# Patient Record
Sex: Female | Born: 1950 | Race: Black or African American | Hispanic: No | Smoking: Never smoker
Health system: Southern US, Community
[De-identification: ages and names within clinical notes are randomized; demographics above are authoritative.]

## PROBLEM LIST (undated history)

## (undated) DIAGNOSIS — M1990 Unspecified osteoarthritis, unspecified site: Secondary | ICD-10-CM

## (undated) DIAGNOSIS — I10 Essential (primary) hypertension: Secondary | ICD-10-CM

## (undated) DIAGNOSIS — L02419 Cutaneous abscess of limb, unspecified: Secondary | ICD-10-CM

## (undated) DIAGNOSIS — L03119 Cellulitis of unspecified part of limb: Secondary | ICD-10-CM

## (undated) DIAGNOSIS — N390 Urinary tract infection, site not specified: Secondary | ICD-10-CM

## (undated) DIAGNOSIS — Z87442 Personal history of urinary calculi: Secondary | ICD-10-CM

## (undated) HISTORY — PX: BREAST CYST ASPIRATION: SHX578

## (undated) HISTORY — PX: BUNIONECTOMY: SHX129

## (undated) HISTORY — PX: KNEE ARTHROSCOPY: SHX127

## (undated) HISTORY — PX: ABDOMINAL HYSTERECTOMY: SHX81

---

## 2002-04-21 IMAGING — MG UNKNOWN MG STUDY
1 series · 4 of 4 positions shown · non-contrast
Comparison: none

REASON FOR EXAM: scr..hm[PHONE_NUMBER]

Procedure: BILATERAL DIGITAL SCREENING MAMMOGRAPHY WITH CAD
 No dominant masses or pathologic clustered calcifications are demonstrated.
The exam is stable from prior exams with nodularity bilaterally. No new
lesions are identified.

[Series 8187: R CC · right · 4 of 4 slices shown]
[im 1/4]
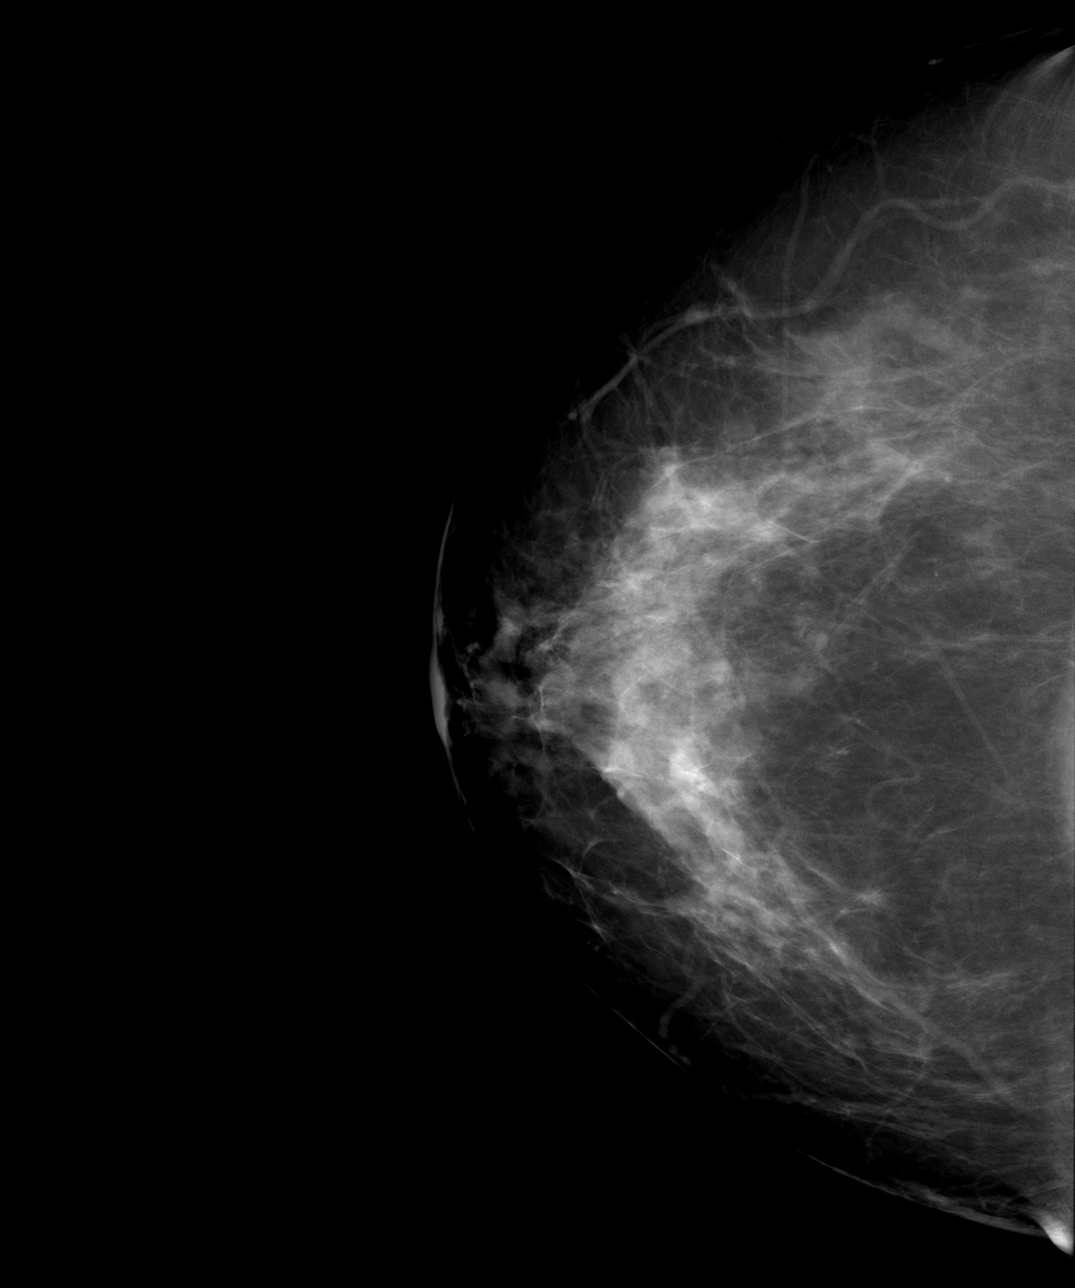
[im 2/4]
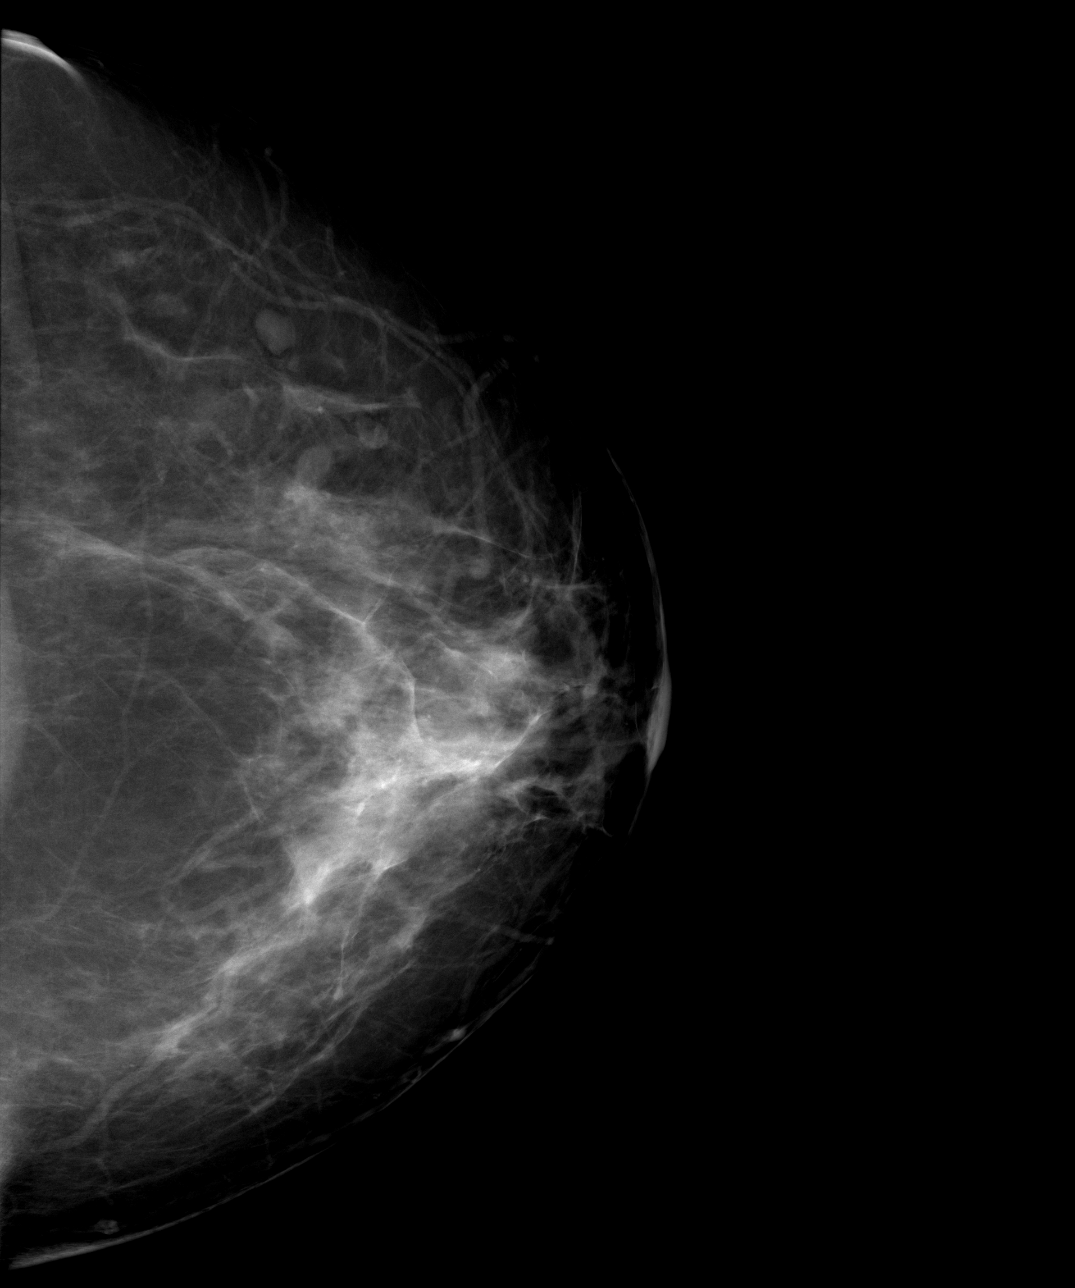
[im 3/4]
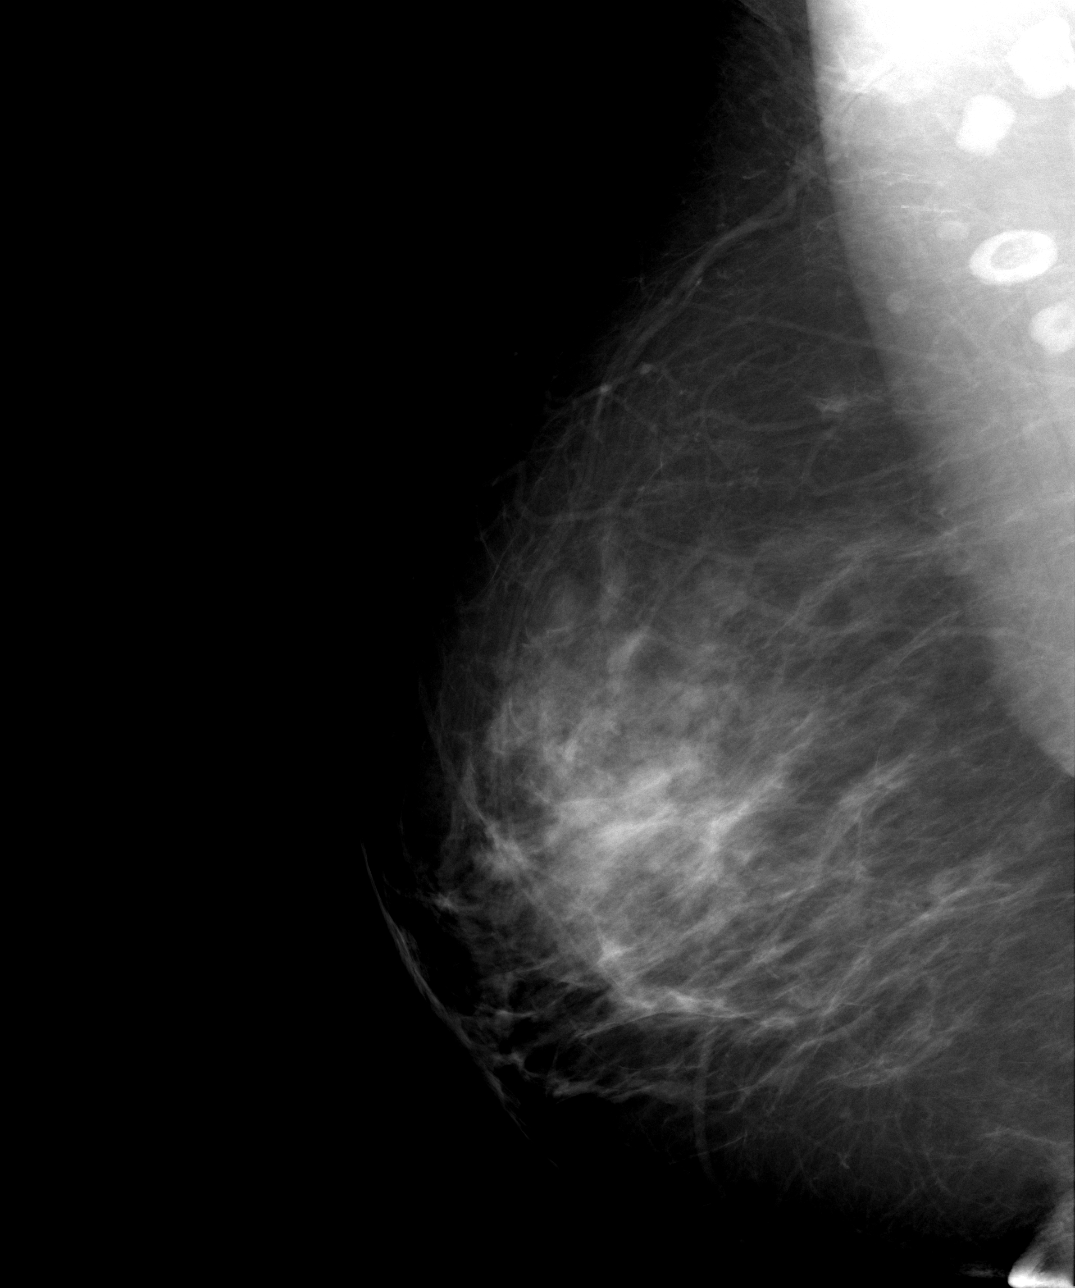
[im 4/4]
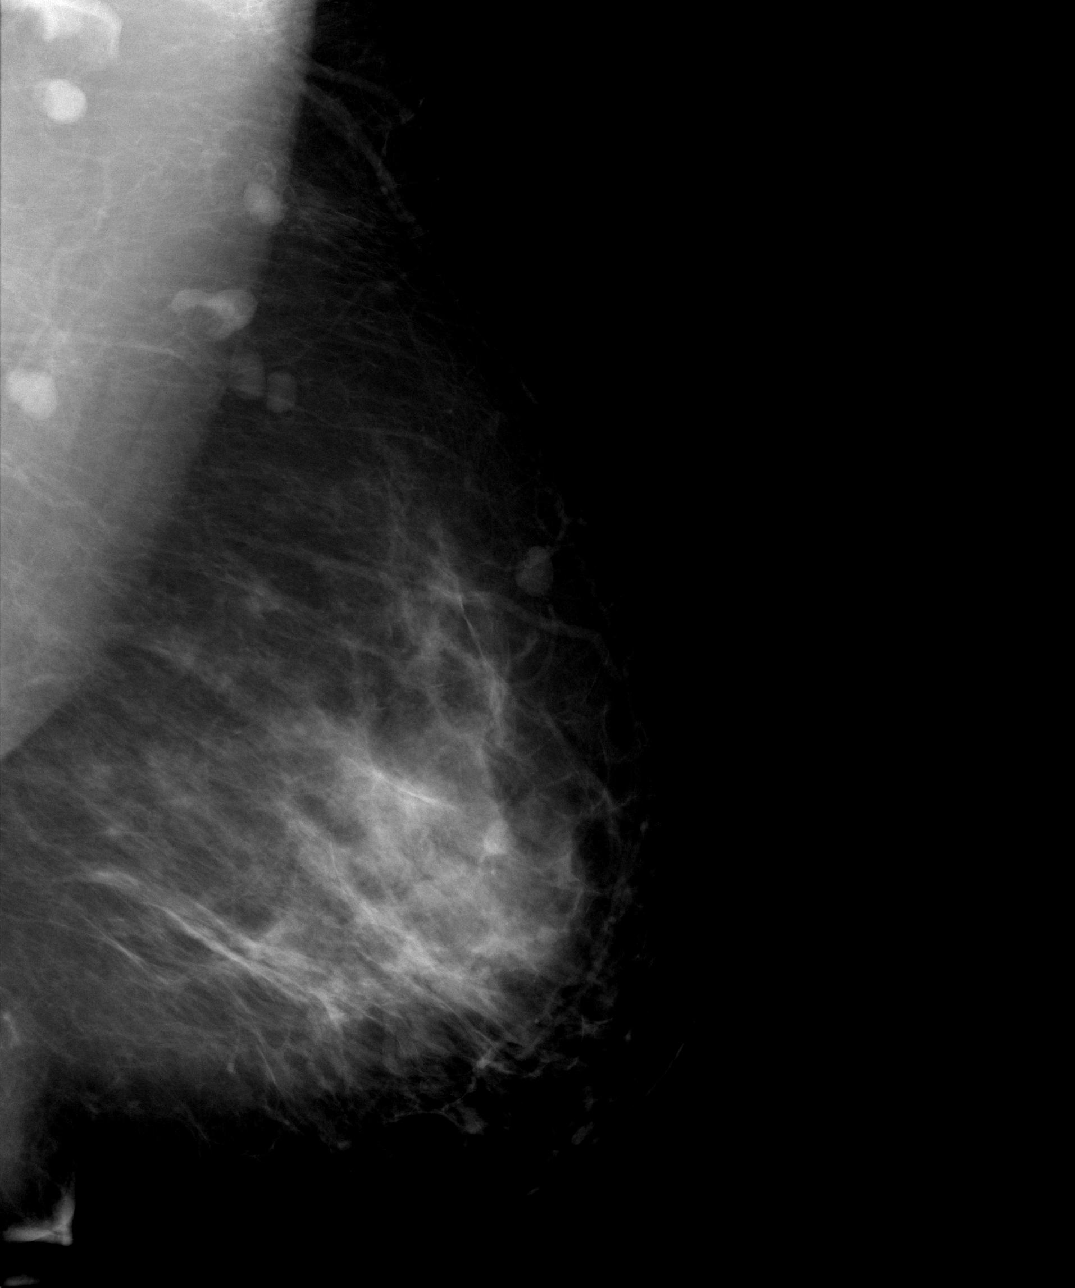

[4 of 4 positions shown; findings below may reference images not displayed]

IMPRESSION: Stable, benign exam. Yearly follow-up mammograms are suggested.

 BI-RADS: Category 2 - Benign Finding

 A NEGATIVE MAMMOGRAM REPORT DOES NOT PRECLUDE BIOPSY OR OTHER EVALUATION OF
CLINICALLY PALPABLE OR OTHERWISE SUSPICIOUS MASS OR LESION. BREAST CANCER
MAY NOT BE DETECTED BY MAMMOGRAPHY IN UP TO 10% OF CASES.

## 2002-05-07 ENCOUNTER — Ambulatory Visit (HOSPITAL_BASED_OUTPATIENT_CLINIC_OR_DEPARTMENT_OTHER): Admission: RE | Admit: 2002-05-07 | Discharge: 2002-05-07 | Payer: Self-pay | Admitting: *Deleted

## 2004-10-24 ENCOUNTER — Ambulatory Visit: Payer: Self-pay | Admitting: Internal Medicine

## 2004-10-27 ENCOUNTER — Ambulatory Visit: Payer: Self-pay | Admitting: Internal Medicine

## 2004-11-07 ENCOUNTER — Emergency Department: Payer: Self-pay | Admitting: Emergency Medicine

## 2005-12-25 IMAGING — CR DG SHOULDER 3+V*L*
1 series · 3 of 3 positions shown · non-contrast
Comparison: none

REASON FOR EXAM: Left shoulder pain
COMMENTS:

PROCEDURE:     DXR - DXR SHOULDER LEFT COMPLETE  - [DATE] [DATE]
RESULT:     Three views of the LEFT shoulder show no fracture, dislocation
or other acute bony abnormality.

[Series 1: view not recorded · 0.17mm/px · 3 of 3 slices shown]
[im 1/3]
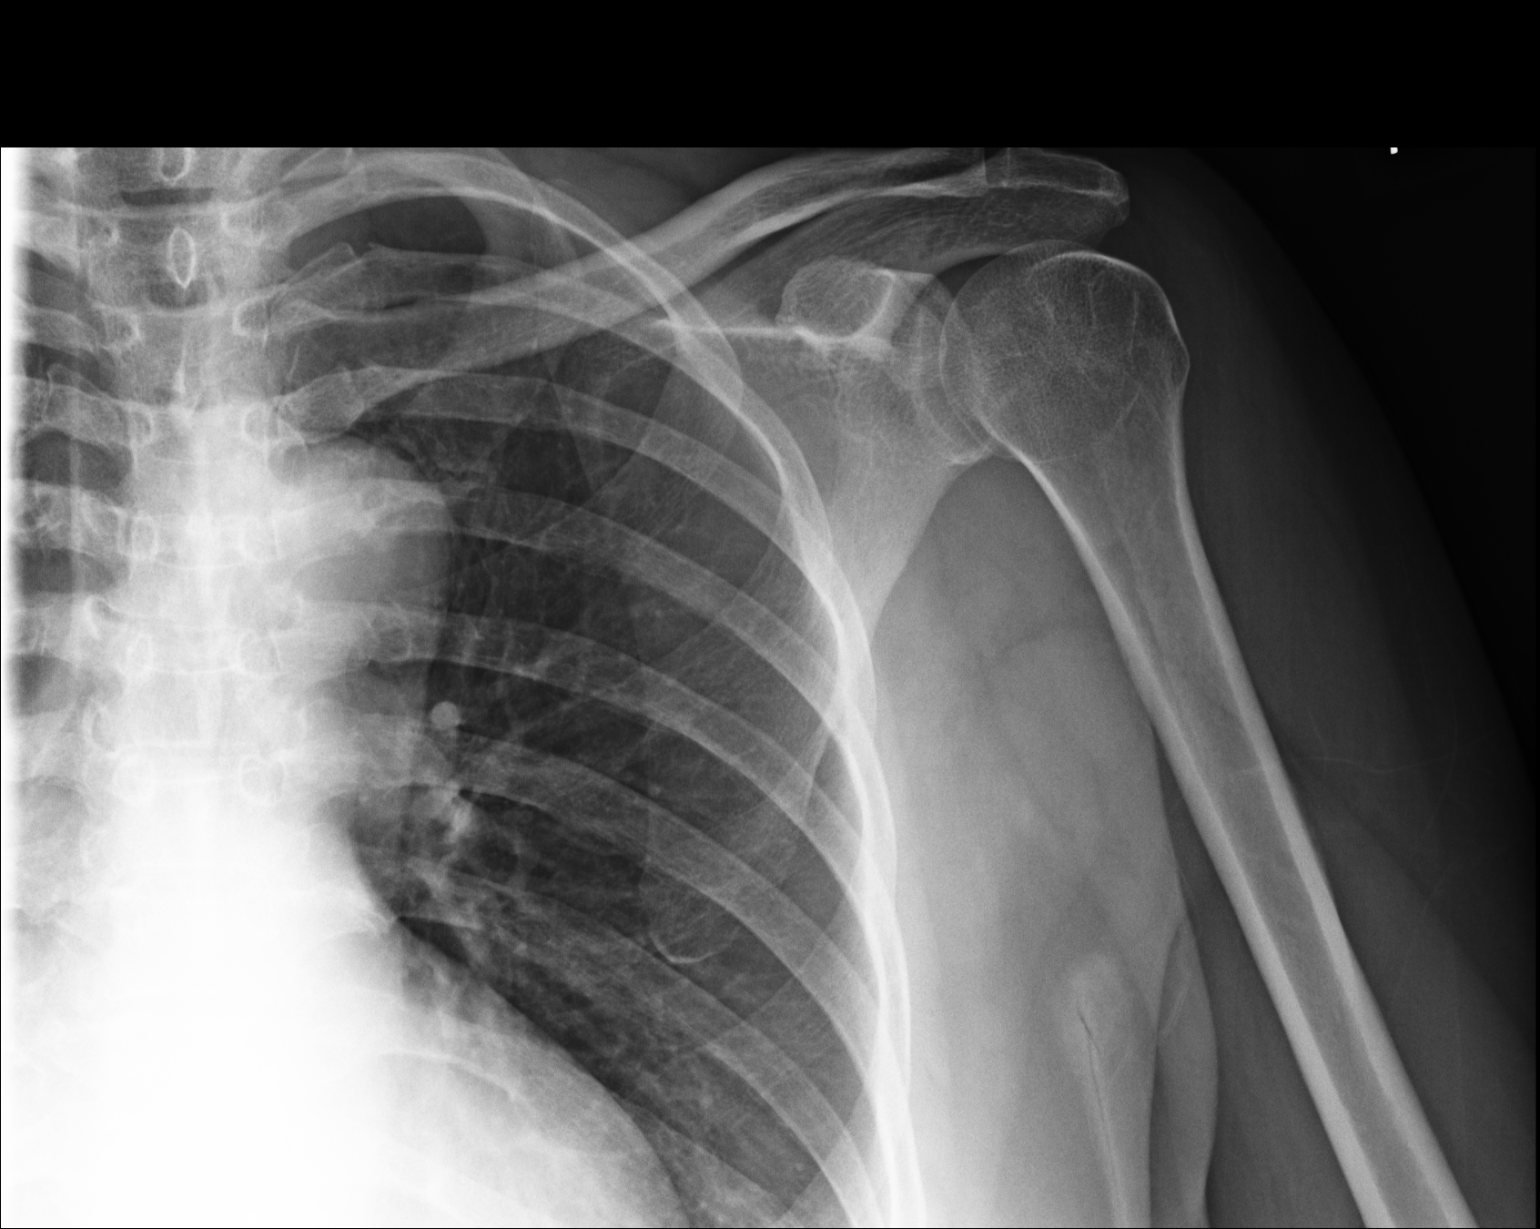
[im 2/3]
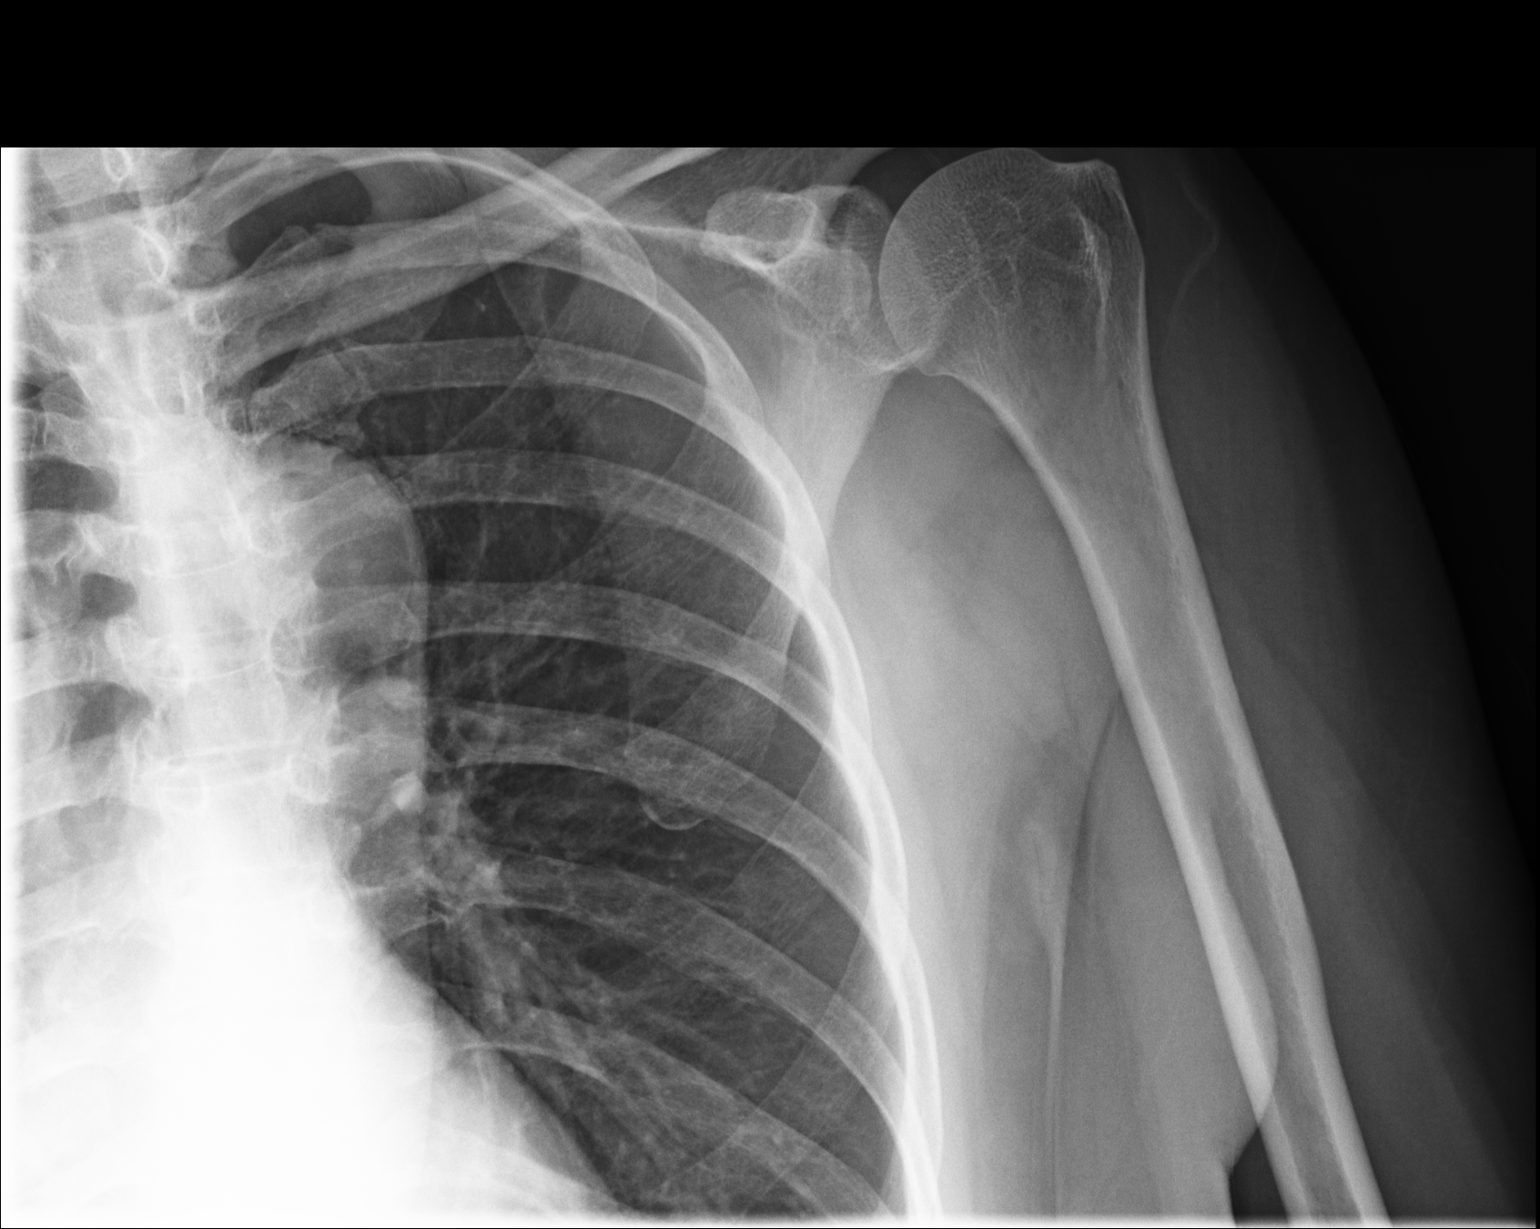
[im 3/3]
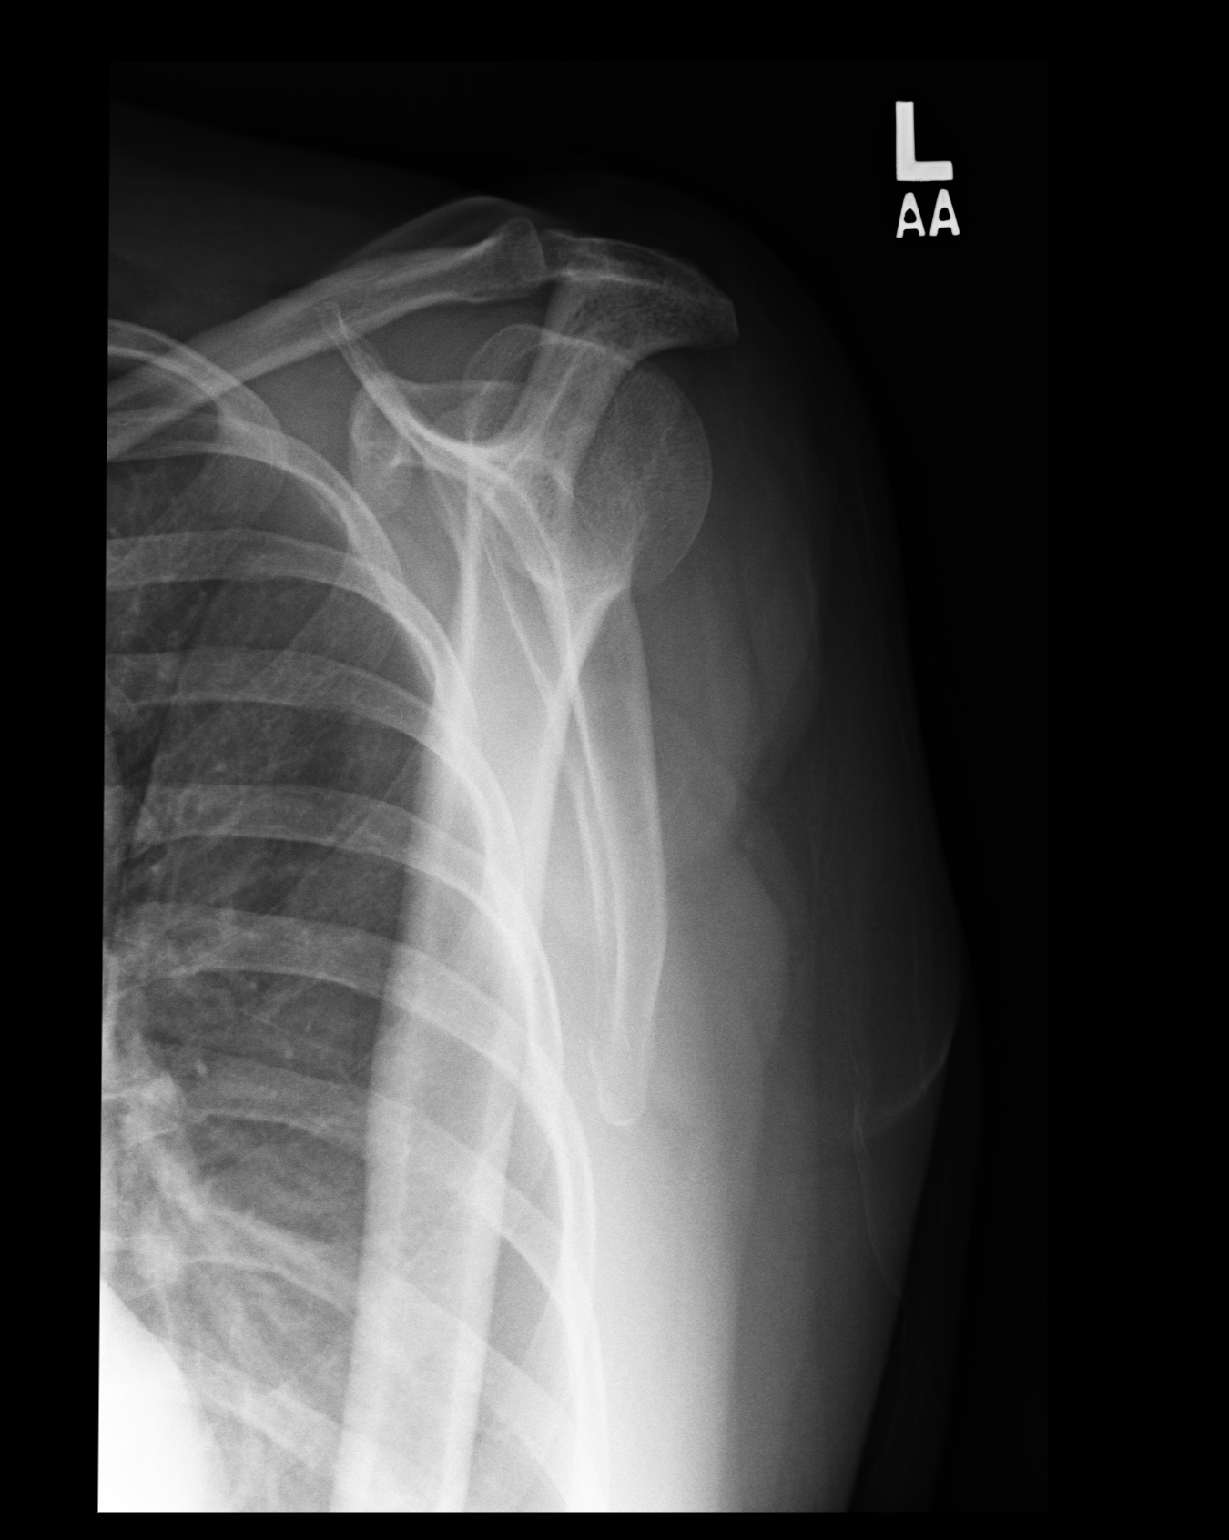

[3 of 3 positions shown; findings below may reference images not displayed]

IMPRESSION: No significant abnormalities are noted.

## 2006-02-26 ENCOUNTER — Emergency Department: Payer: Self-pay | Admitting: Unknown Physician Specialty

## 2006-02-26 ENCOUNTER — Other Ambulatory Visit: Payer: Self-pay

## 2006-02-26 IMAGING — CR DG CHEST 1V PORT
1 series · 1 of 1 positions shown · non-contrast
Comparison: none

REASON FOR EXAM: Chest pain      rm 19
COMMENTS:  LMP: Post Hysterectomy

[view not recorded]
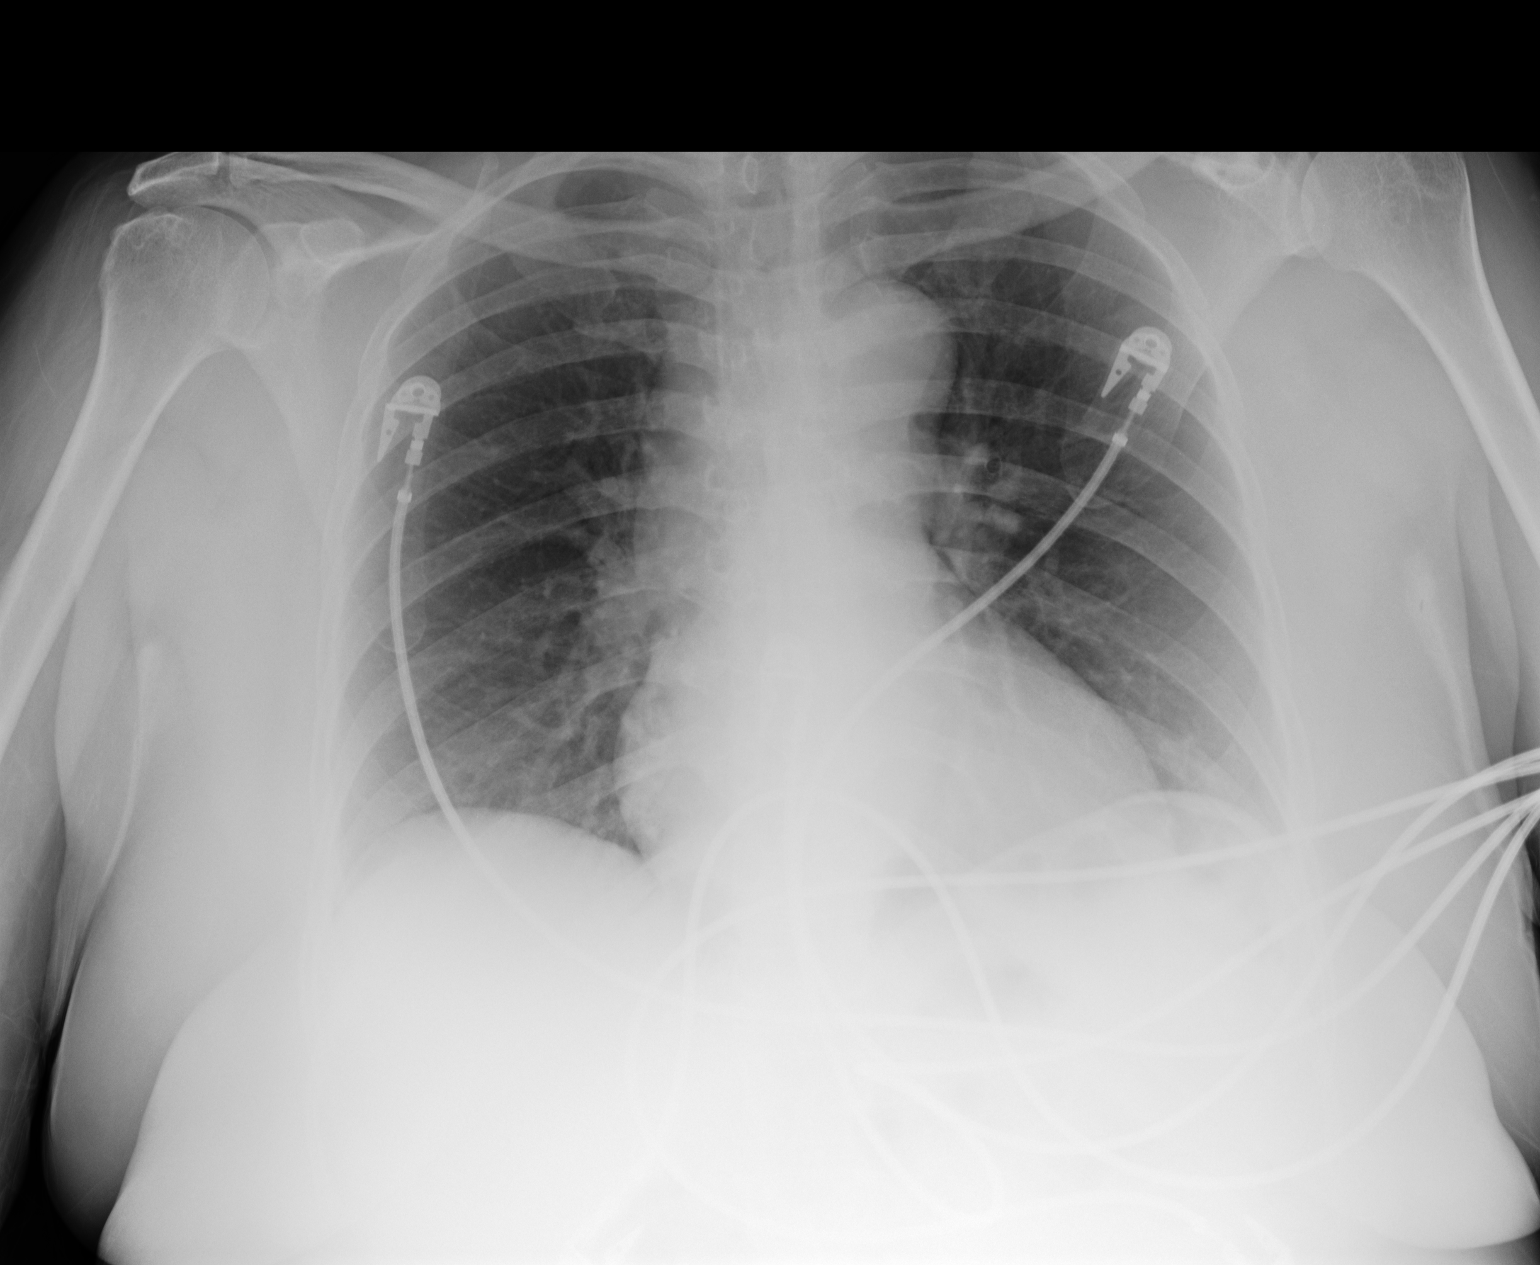

[1 of 1 positions shown; findings below may reference images not displayed]

PROCEDURE:     DXR - DXR PORTABLE CHEST SINGLE VIEW  - [DATE]  [DATE]

RESULT:       The lungs are adequately inflated. There is no focal
infiltrate. The heart is not enlarged.  There is mild tortuosity of the
descending thoracic aorta.  There is prominence of the central pulmonary
vascularity to a mild degree.  No cephalization is seen.
IMPRESSION: I do not see evidence of pneumonia nor definite evidence of other acute
cardiopulmonary disease.  A follow-up PA and lateral chest x-ray would be of
value.

## 2006-02-26 IMAGING — CT CT CHEST W/ CM
2 series · 15 of 31 positions shown, 19 images · IV contrast (APPLIED)
Comparison: none

REASON FOR EXAM: Chest pain, elevated D-Dimer
COMMENTS:  LMP: Post Hysterectomy

[Series 4: soft tissue · axial · 0.74mm/px · z∈[-80,-40]mm · 2 of 85 slices shown]
[im 7/85  mediastinal]
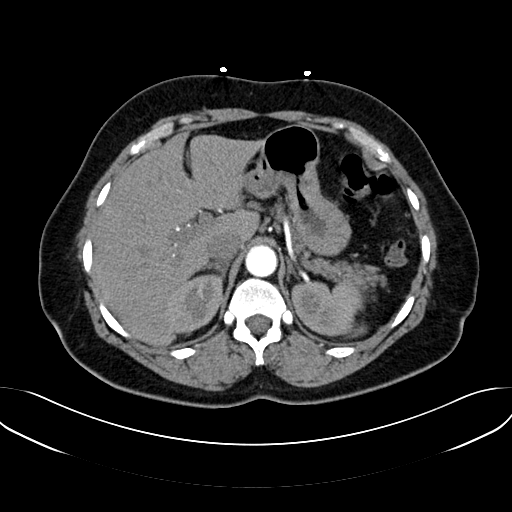
[im 20/85  mediastinal]
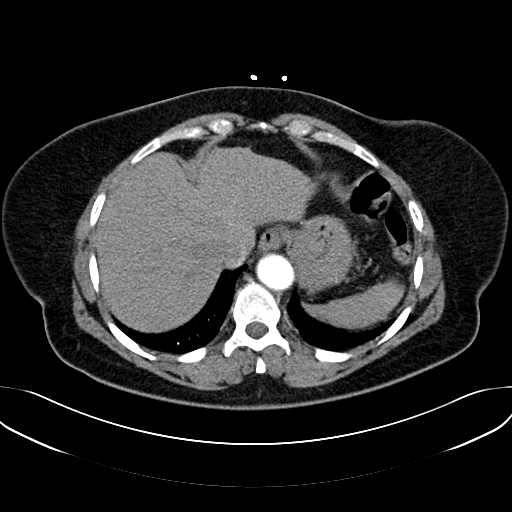

[Series 5: lung windows · axial · 0.74mm/px · z∈[-74,+134]mm · 13 of 83 slices shown, 17 images]
[im 7/83  mediastinal]
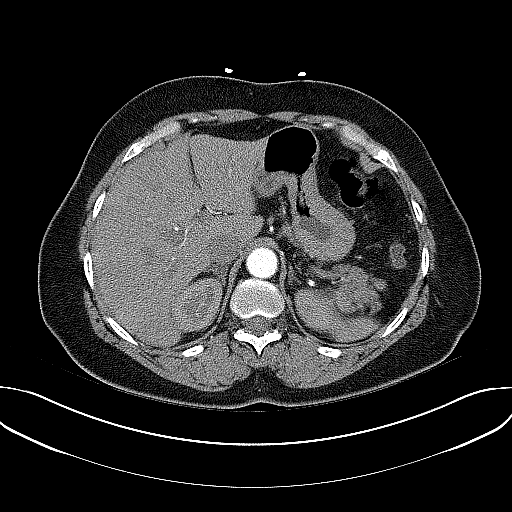
[im 7/83  lung]
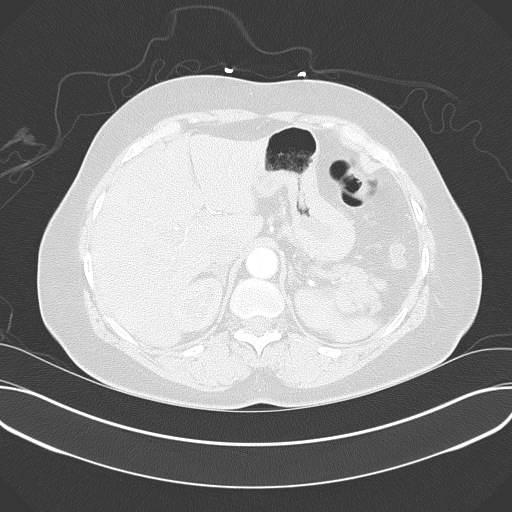
[im 13/83  lung]
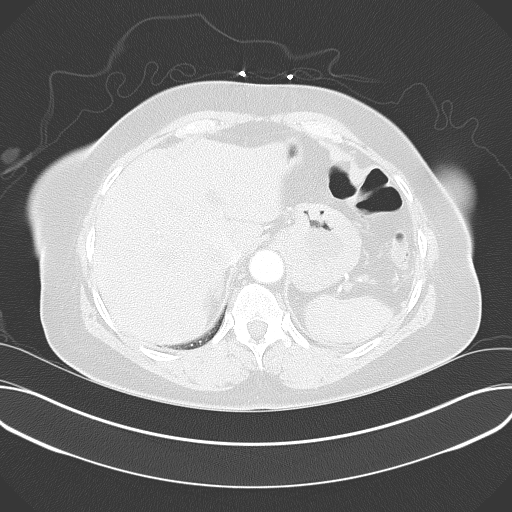
[im 19/83  lung]
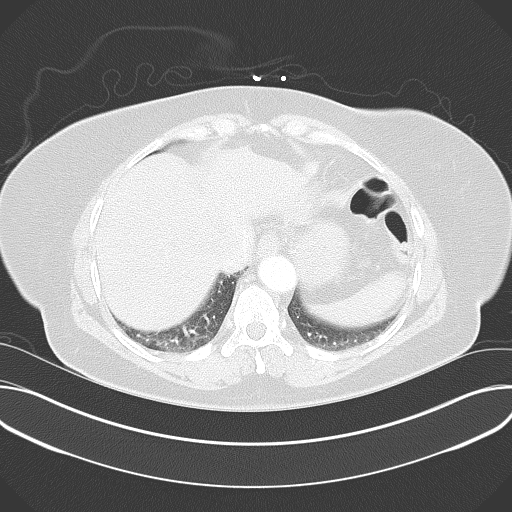
[im 26/83  lung]
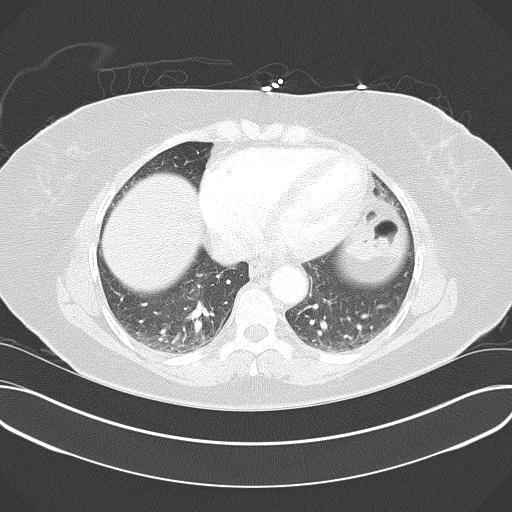
[im 32/83  mediastinal]
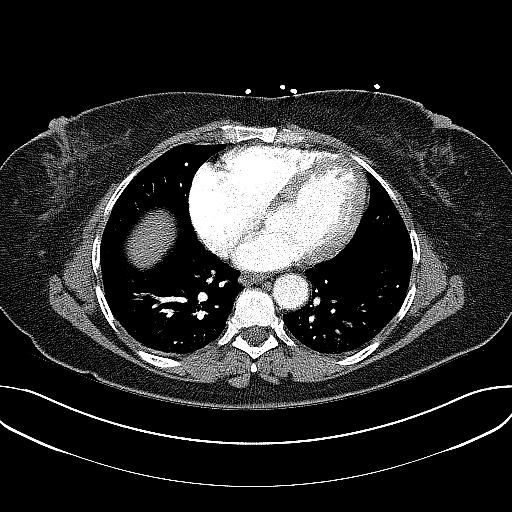
[im 32/83  lung]
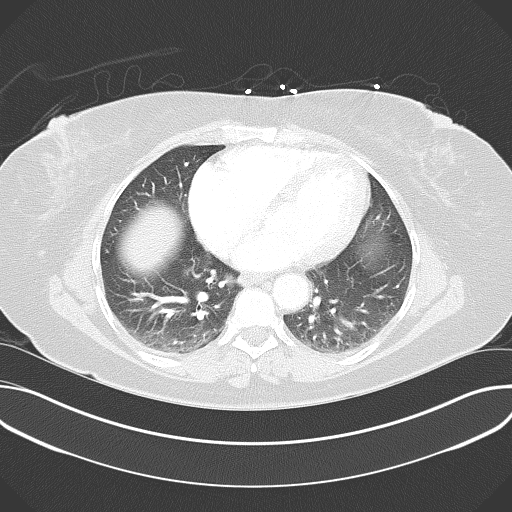
[im 38/83  lung]
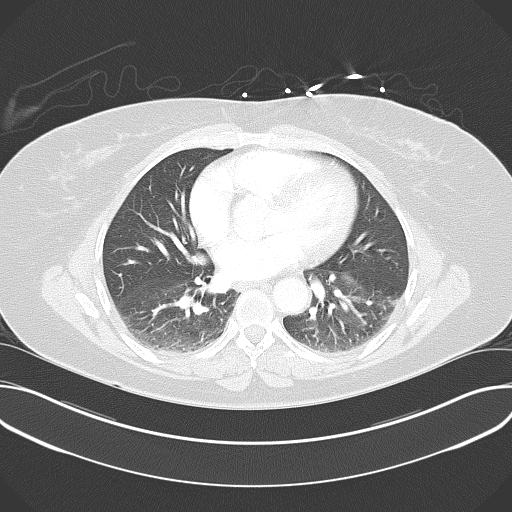
[im 42/83  lung]
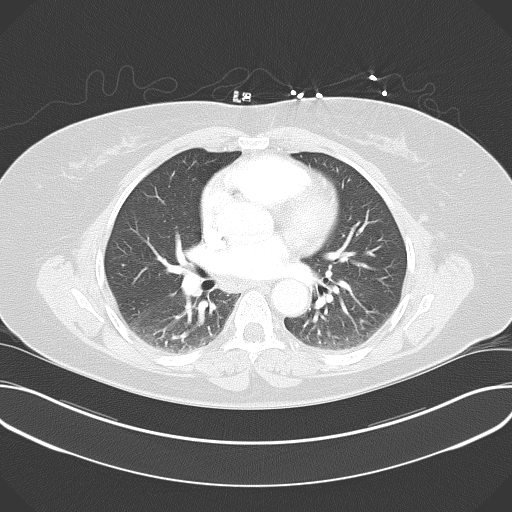
[im 45/83  lung]
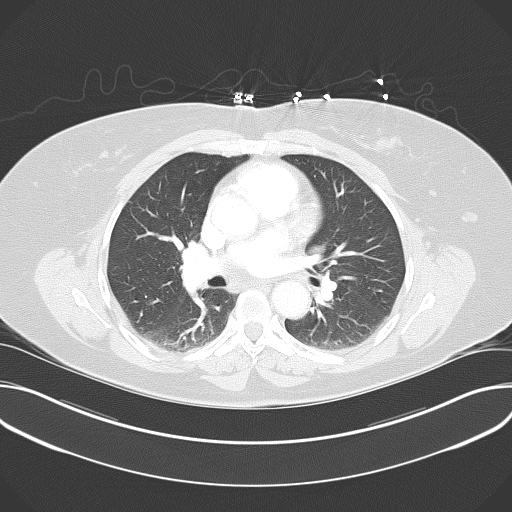
[im 51/83  mediastinal]
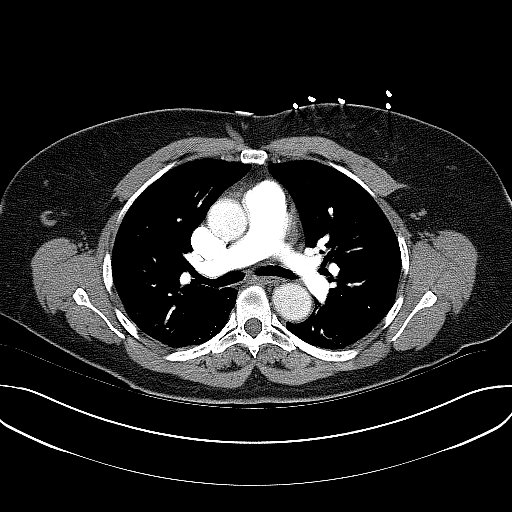
[im 51/83  lung]
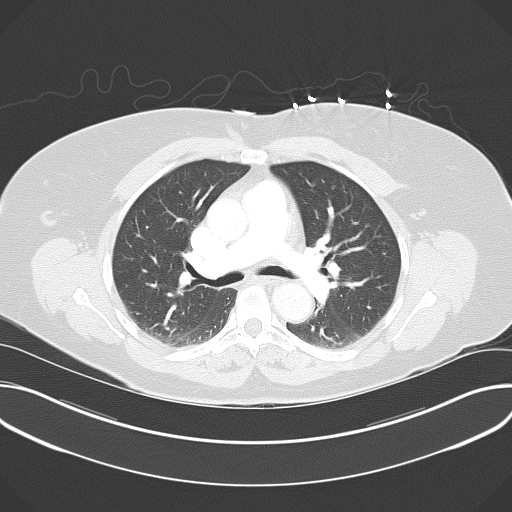
[im 57/83  lung]
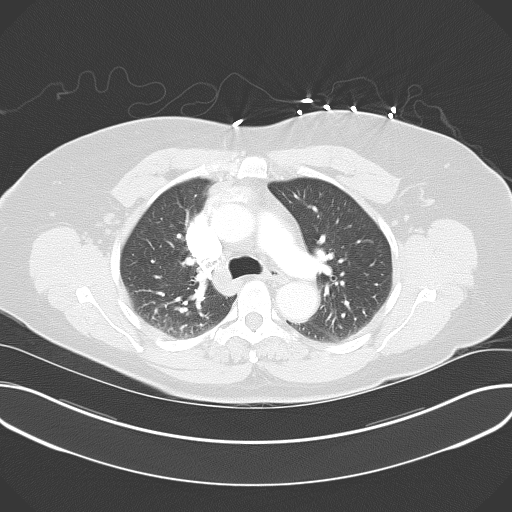
[im 64/83  lung]
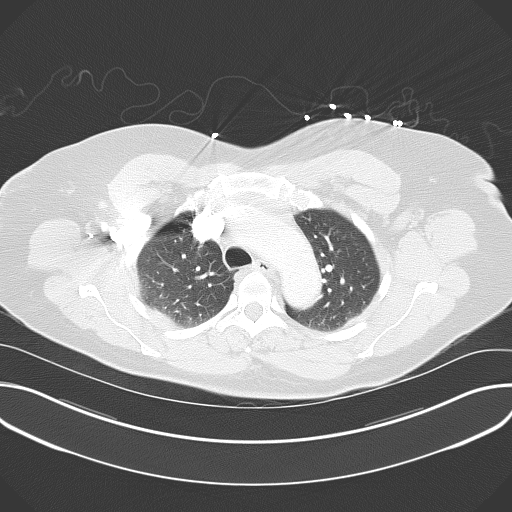
[im 70/83  lung]
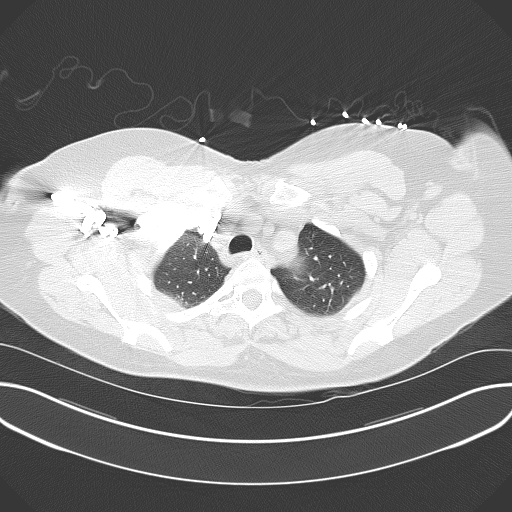
[im 76/83  mediastinal]
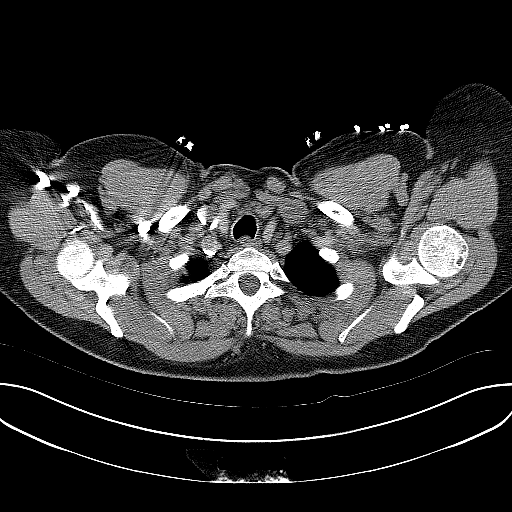
[im 76/83  lung]
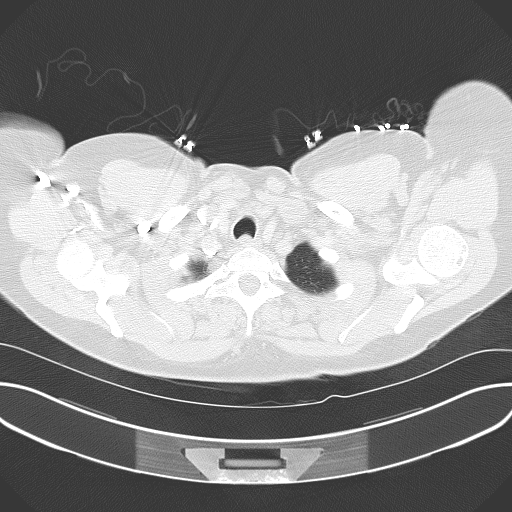

[15 of 31 positions shown; findings below may reference images not displayed]

PROCEDURE:     CT  - CT CHEST (FOR PE) W  - [DATE]  [DATE]

RESULT:     This is a re-dictation of a previously dictated report.

Spiral 3.0 mm sections were obtained from the lung bases through the pubic
symphysis.

Evaluation of the mediastinum and hilar regions and structures demonstrates
no evidence of mediastinal or hilar masses or adenopathy.

There is no evidence of filling defects in the main lobar or segmental
pulmonary arteries to suggest the sequelae of a pulmonary embolus. The
visualized lung parenchyma demonstrates mild bibasilar hypoventilation but
otherwise unremarkable. The visualized upper abdominal viscera demonstrate
no radiographic abnormalities.
IMPRESSION: 1.     No radiographic evidence of pulmonary embolic disease as described
above.
2.     Dr. WICKS of the Emergency Department was informed of these findings
at the time of the initial interpretation on [DATE].
3.     Minimal hypoventilation within the lung bases. Otherwise, no further
pulmonary abnormalities.

## 2006-07-12 ENCOUNTER — Ambulatory Visit: Payer: Self-pay | Admitting: Internal Medicine

## 2006-07-12 IMAGING — CR DG KNEE COMPLETE 4+V*L*
1 series · 4 of 4 positions shown · non-contrast
Comparison: none

REASON FOR EXAM: LEFT KNEE HX FALL
COMMENTS:

[Series 1: view not recorded · 0.17mm/px · 4 of 4 slices shown]
[im 1/4]
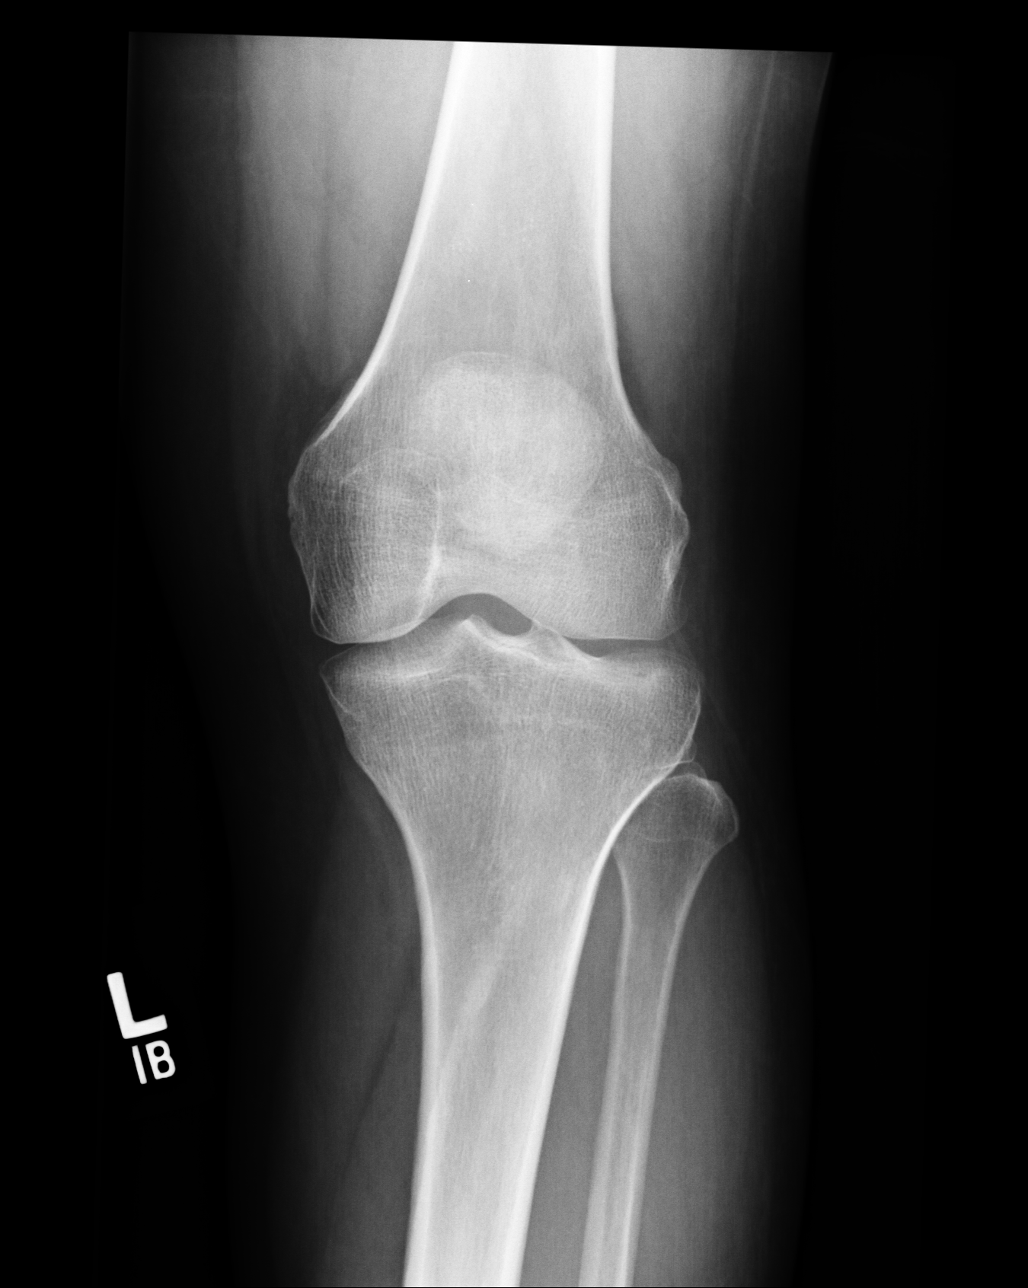
[im 2/4]
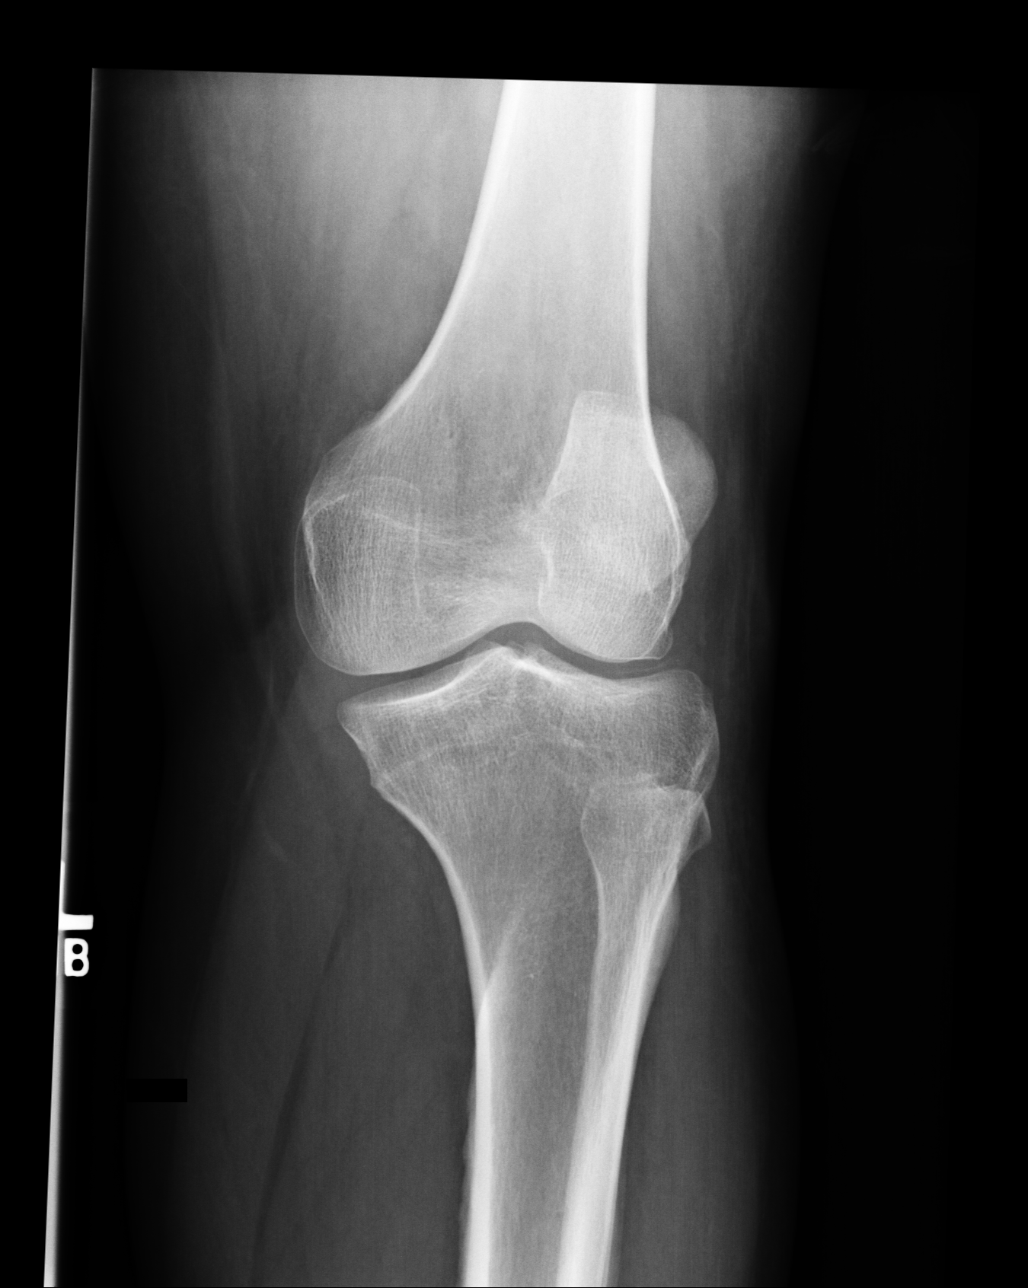
[im 3/4]
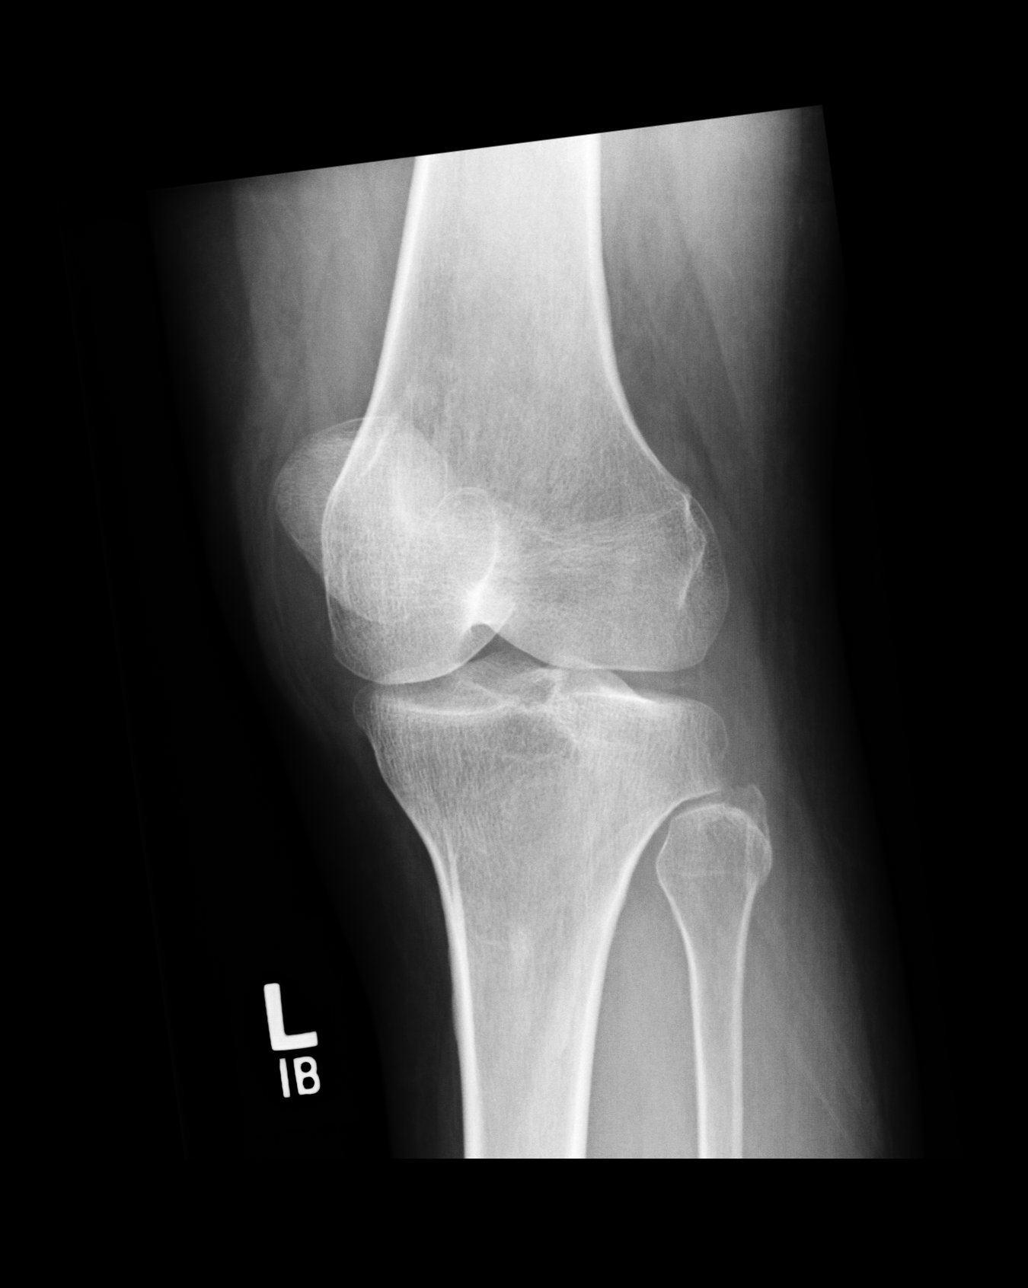
[im 4/4]
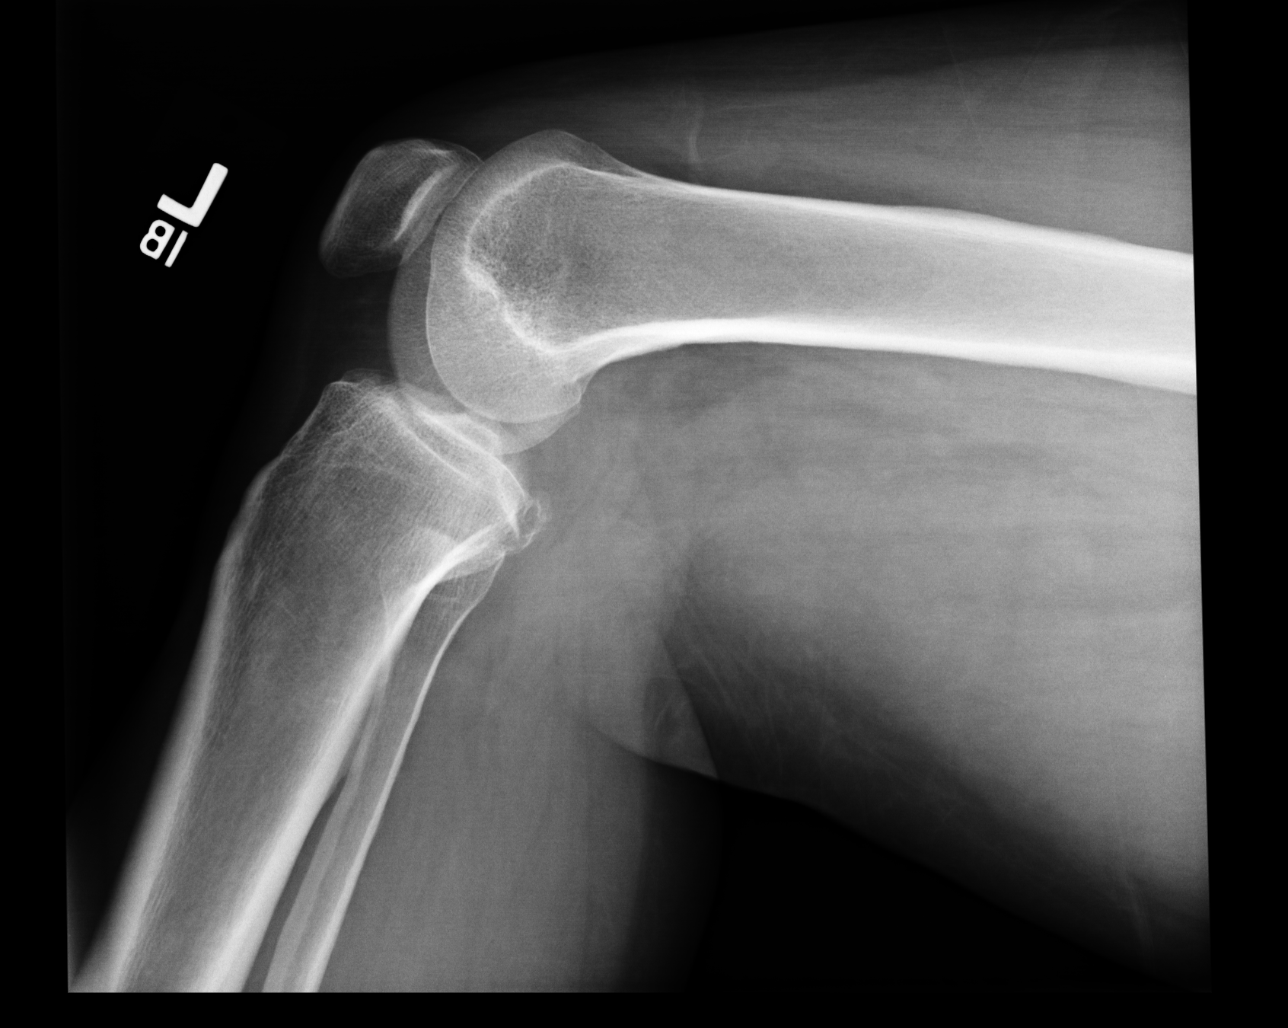

[4 of 4 positions shown; findings below may reference images not displayed]

PROCEDURE:     DXR - DXR KNEE LT COMP WITH OBLIQUES  - [DATE] [DATE]

RESULT:        Four views of the LEFT knee show no fracture, dislocation or
other acute bony abnormality.   There is noted slight calcification in the
knee joint space laterally.  The calcification is faint but is of the type
that can be seen with pseudogout, gout or osteoarthritis.  No definite
arthritic spurring about the knee is seen.  The patella is intact.
IMPRESSION: No fracture or other acute bony abnormality is identified.

There is faint calcification in the knee joint space laterally.

## 2006-07-12 IMAGING — CR DG KNEE COMPLETE 4+V*R*
1 series · 4 of 4 positions shown · non-contrast
Comparison: none

REASON FOR EXAM: XRAY RT KNEE PAIN HX FALL
COMMENTS:

[Series 1: view not recorded · 0.17mm/px · 4 of 4 slices shown]
[im 1/4]
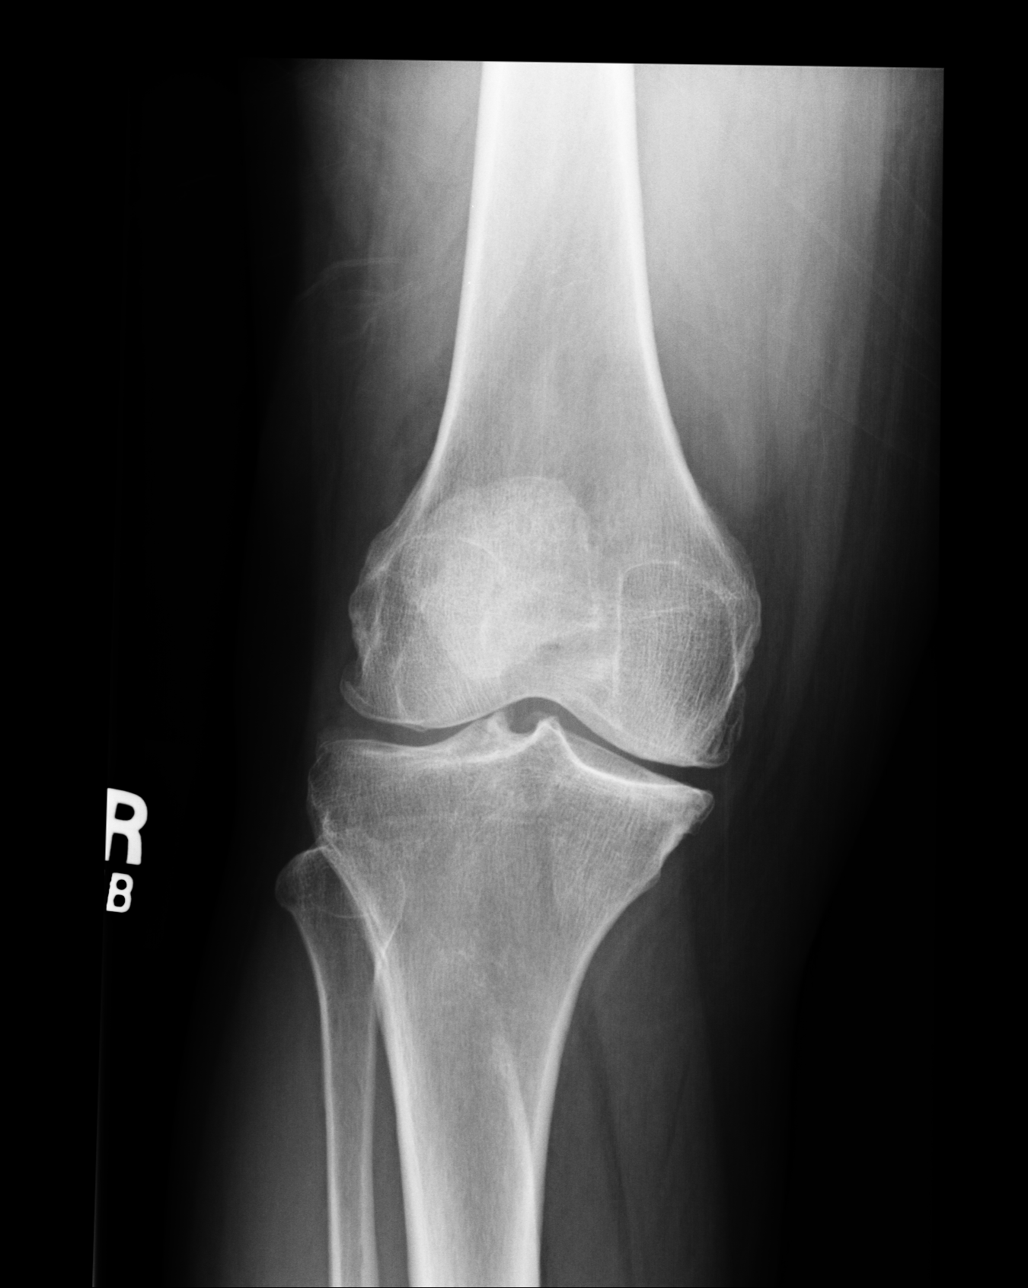
[im 2/4]
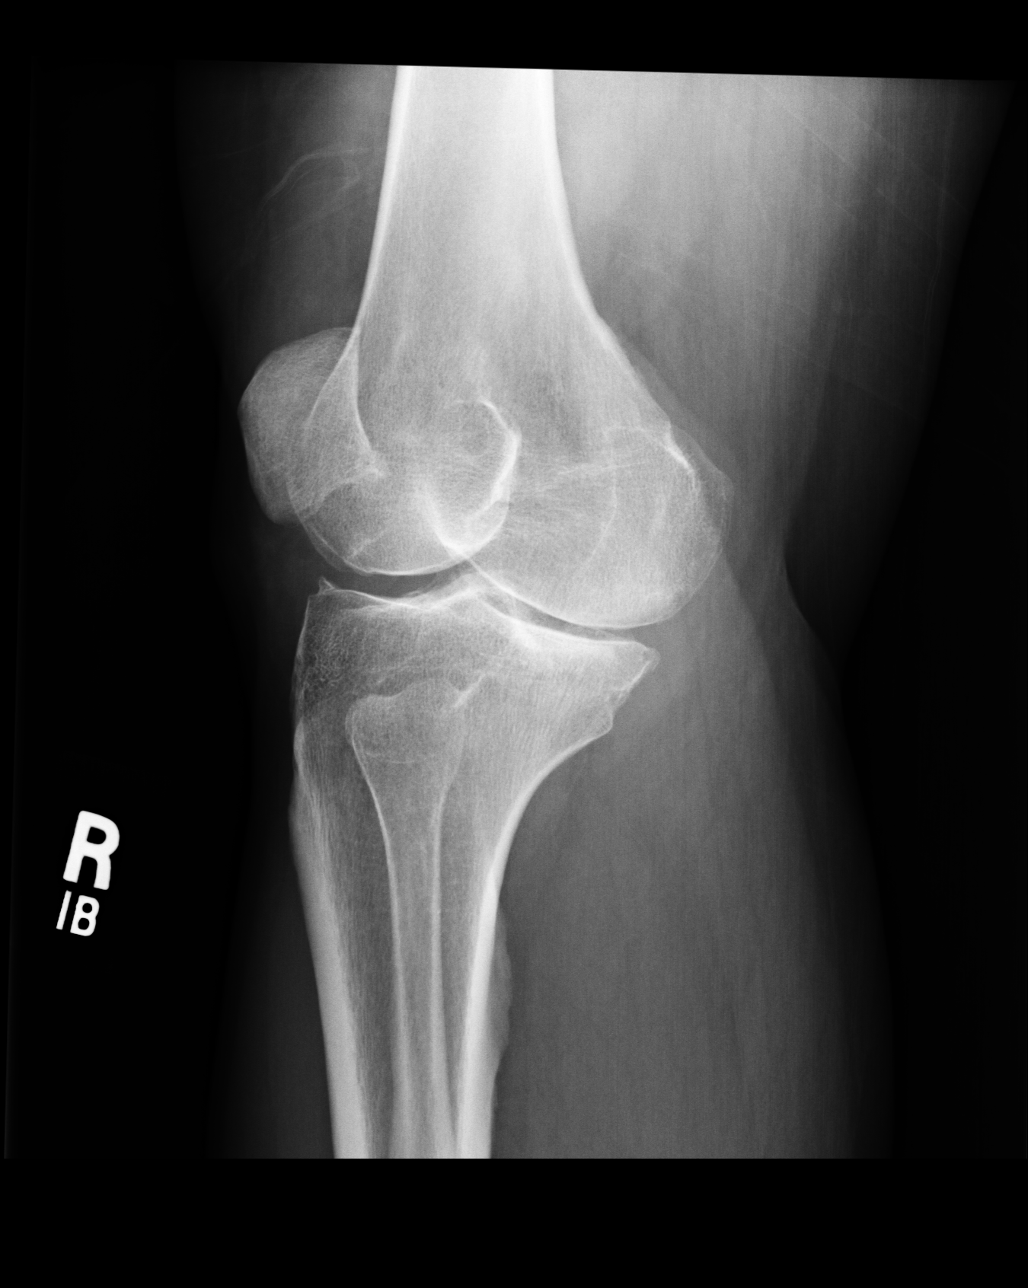
[im 3/4]
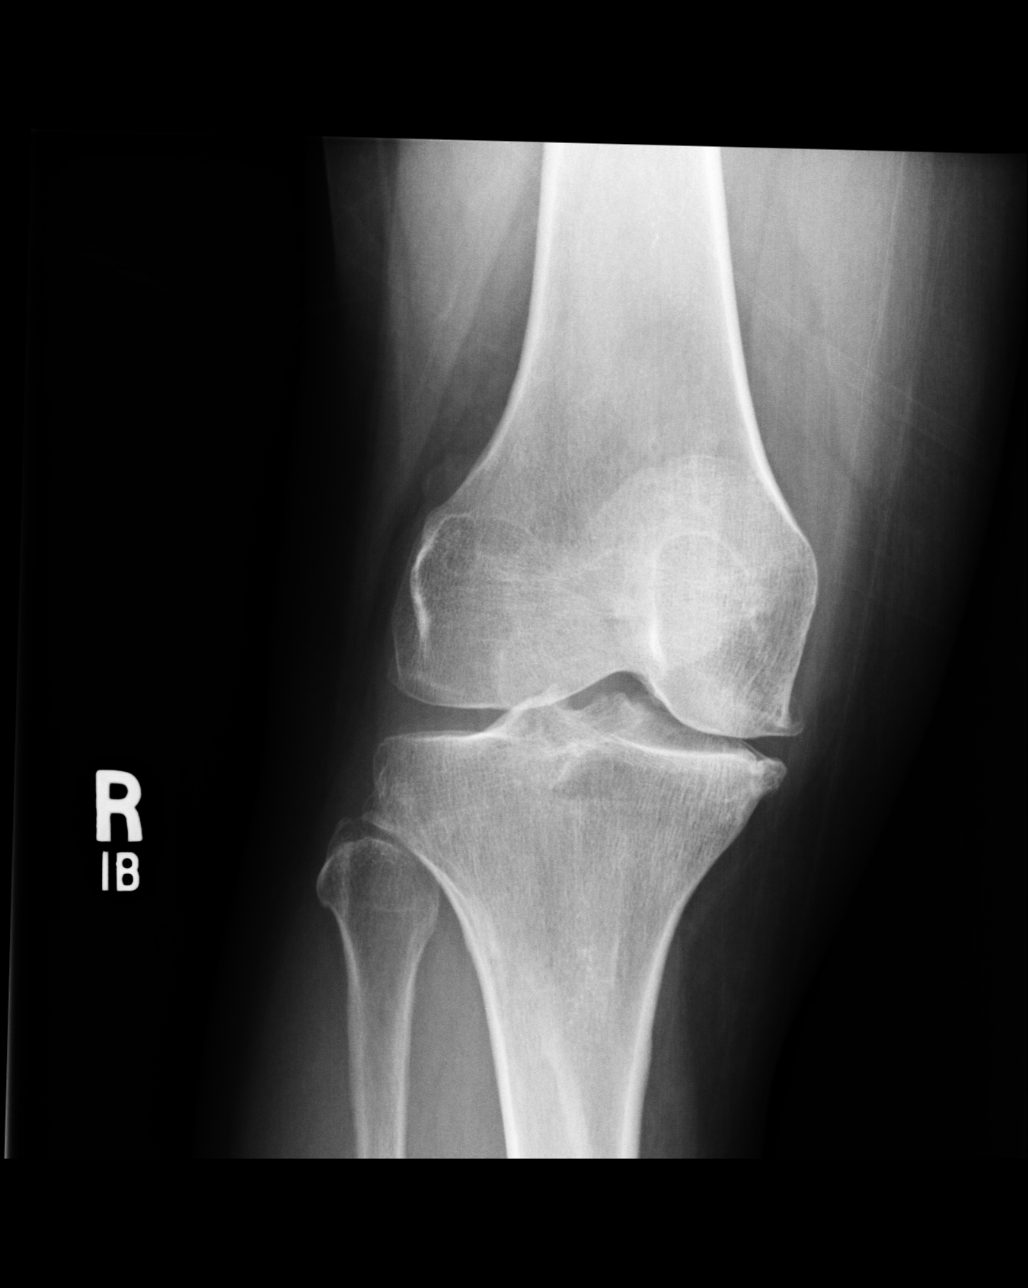
[im 4/4]
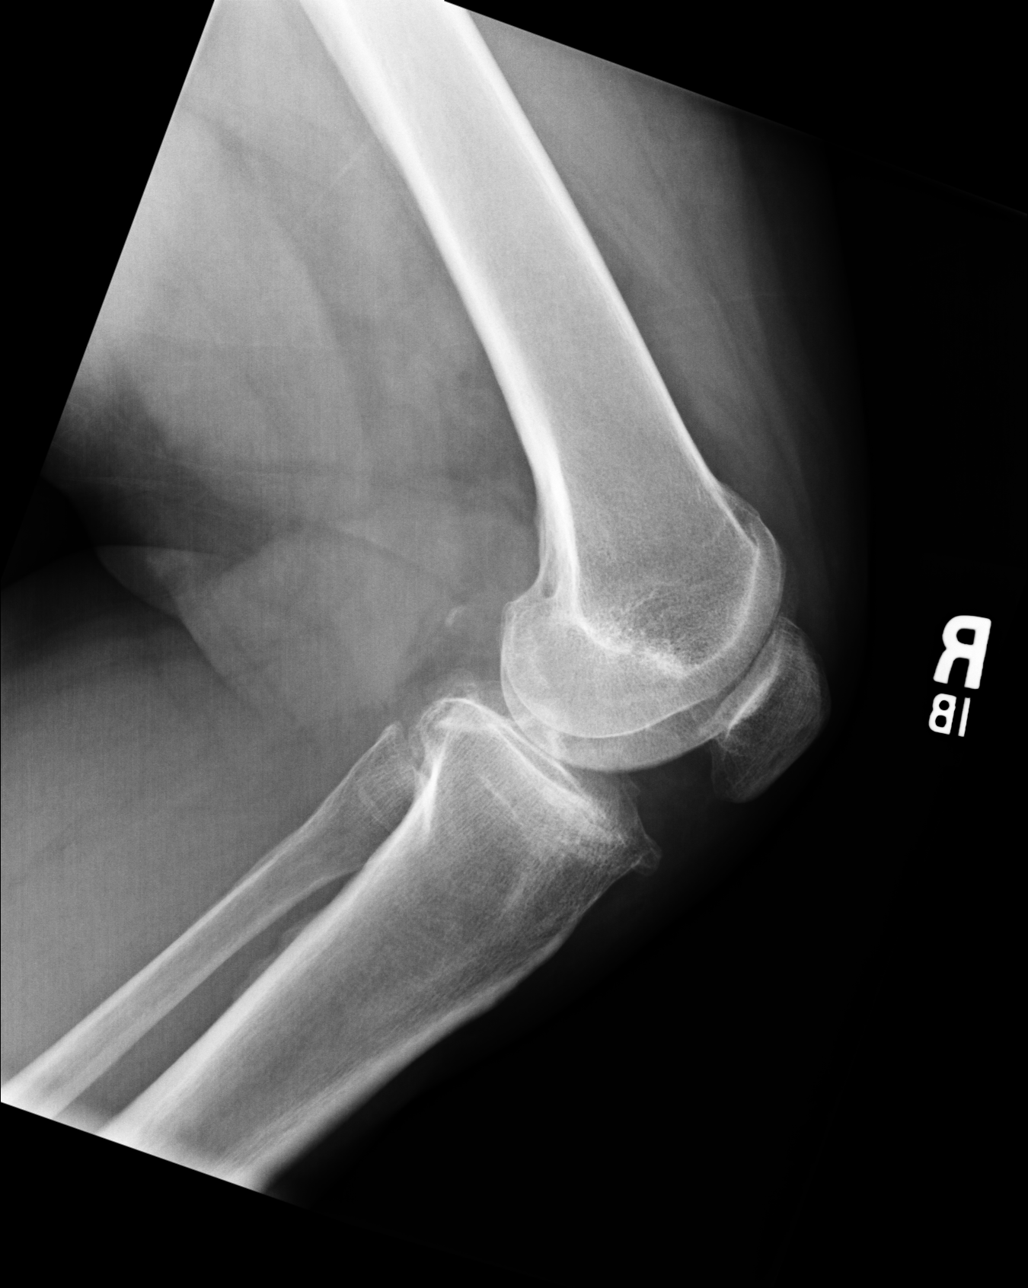

[4 of 4 positions shown; findings below may reference images not displayed]

PROCEDURE:     DXR - DXR KNEE RT COMP WITH OBLIQUES  - [DATE] [DATE]

RESULT:     Four views of the RIGHT knee were obtained.  No fracture or
dislocation is observed.  There is noted mild degenerative spurring about
the knee, more prominent medially where there is also noted slight narrowing
in the knee joint space consistent with arthritic change.  No erosive or
cystic changes are identified.  There is no sclerosis of the adjacent
articular surfaces.  The patella is intact.
IMPRESSION: No fracture is seen.

Arthritic changes are noted at the knee as described above.

## 2006-08-27 ENCOUNTER — Ambulatory Visit: Payer: Self-pay | Admitting: Internal Medicine

## 2007-09-28 ENCOUNTER — Emergency Department: Payer: Self-pay | Admitting: Emergency Medicine

## 2008-04-21 ENCOUNTER — Emergency Department: Payer: Self-pay | Admitting: Emergency Medicine

## 2008-05-04 ENCOUNTER — Emergency Department: Payer: Self-pay | Admitting: Emergency Medicine

## 2009-03-15 ENCOUNTER — Ambulatory Visit (HOSPITAL_BASED_OUTPATIENT_CLINIC_OR_DEPARTMENT_OTHER): Admission: RE | Admit: 2009-03-15 | Discharge: 2009-03-15 | Payer: Self-pay | Admitting: Orthopedic Surgery

## 2009-11-17 ENCOUNTER — Ambulatory Visit: Payer: Self-pay | Admitting: Family Medicine

## 2010-04-01 ENCOUNTER — Ambulatory Visit: Payer: Self-pay | Admitting: Family Medicine

## 2010-05-05 ENCOUNTER — Emergency Department: Payer: Self-pay | Admitting: Emergency Medicine

## 2011-01-01 LAB — POCT I-STAT 4, (NA,K, GLUC, HGB,HCT): Potassium: 3.8 mEq/L (ref 3.5–5.1)

## 2011-01-31 ENCOUNTER — Ambulatory Visit: Payer: Self-pay | Admitting: Family Medicine

## 2011-02-06 NOTE — Op Note (Signed)
NAME:  Jennifer Rubio, Jennifer Rubio                ACCOUNT NO.:  [id number]   MEDICAL RECORD NO.:  [id number]          PATIENT TYPE:  AMB   LOCATION:  [facility]                         FACILITY:  [facility]   PHYSICIAN:  Marlowe Kays, M.D.  DATE OF BIRTH:  October 19, 1950   DATE OF PROCEDURE:  03/15/2009  DATE OF DISCHARGE:                               OPERATIVE REPORT   PREOPERATIVE DIAGNOSES:  Torn medial and lateral menisci, left knee.   POSTOPERATIVE DIAGNOSES:  Torn medial and lateral menisci, left knee.   OPERATION:  Left knee arthroscopy with partial medial and lateral  meniscectomies and shaving of medial femoral condyle.   SURGEON:  Marlowe Kays, M.D.   ASSISTANT:  Nurse.   ANESTHESIA:  General.   PATHOLOGY AND JUSTIFICATION FOR PROCEDURE:  She has had a prior right  knee arthroscopy and because of a painful left knee, had an MRI  performed January 04, 2009 which showed extensive tearing of both medial  and lateral menisci leading to today's surgery.  See operative  description below.   PROCEDURE:  Satisfactory general anesthesia, Ace wrap and knee support  to right lower extremity, pneumatic tourniquet applied to left lower  extremity with the leg Esmarched out non sterilely.  Thigh stabilizer  applied and leg prepped from stabilizer to ankle with DuraPrep and  draped in sterile field.  Superior medial saline inflow after time-out  performed.  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  She had  extensive tear involving the entire posterior 40% of the medial meniscus  all the way into the intercondylar area, I trimmed this back to a stable  rim with baskets and then shaved it down until smooth with a 3.5 shaver.  Final pictures were taken.  Looking at the medial gutter  and  suprapatellar area, she had some wear of her patella but nothing that  was arthroscopically treatable.  I then reversed portals.  She did have  extensive tear following the posterior two thirds of lateral meniscus  which I treated with a combination of resection back to stable rim with  baskets and shaving down until smooth with a 3.5 shaver.  She was noted  also have some wear of the lateral tibial plateau which did not require  shaving.  I then irrigated the knee joint until clear and all fluid  possible removed.  I closed the two entry portals with 4-0 nylon and  then injected through the inflow apparatus 20 mL of 0.5% Marcaine with  adrenaline and 4 mg of morphine, closing this portal with 4-0 nylon as  well.  Betadine, Adaptic, dry sterile dressing were applied.  Tourniquet  was released.  At time of this dictation she was on her way to the  recovery room in satisfactory condition with no known complications.          ______________________________  Marlowe Kays,  M.D.    JA/MEDQ  D:  03/15/2009  T:  03/15/2009  Job:  [id number]

## 2011-02-09 NOTE — Op Note (Signed)
NAME:  Jennifer Rubio, Jennifer Rubio                            ACCOUNT NO.:  [id number]   MEDICAL RECORD NO.:  [id number]                   PATIENT TYPE:   LOCATION:                                       FACILITY:   PHYSICIAN:  Reynolds Bowl, M.D.                 DATE OF BIRTH:   DATE OF PROCEDURE:  05/07/2002  DATE OF DISCHARGE:                                 OPERATIVE REPORT   PREOPERATIVE DIAGNOSES:  Right knee early osteoarthritis and posterior horn  tear in the medial meniscus.   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   OPERATIVE PROCEDURE:  Arthroscopic exam and partial medial meniscectomy.   SURGEON:  Reynolds Bowl, M.D.   ANESTHESIA:  General anesthesia.   DESCRIPTION OF PROCEDURE:  The patient was given a general anesthetic via  endotracheal tube.  The right lower extremity was prepped and draped in the  usual manner.  Proximal pneumatic tourniquet was put in place.  Following  the drape, the leg was exsanguinated with an Esmarch bandage, proximal  pneumatic tourniquet elevated to approximately 300 mmHg.  Anteromedial and  anterolateral portals were established.  The knee was examined.  The  patellofemoral joint appeared essentially normal.  There was a thickened  irritated band of synovium medially and that was dissected as encountered.  In the medial joint space, there was a degenerative posterior horn with  horizontal tear, a small flap and the notch area appeared normal and the  lateral meniscus had fraying edges along its anterior margins but was  basically intact.   Attention was directed back to the posterior horn of the medial meniscus,  which was resected using various rongeurs and the rotary meniscotome.  It  should be noted there were grade 2 and 3 changes of the medial compartment,  a little bit more on the tibial side than the femoral side, and there was a  deep  grooving posteriorly near the edge of the meniscus.  It went down to  but not quite to subchondral bone.  Overall, the medial femoral condyle was  shedding superficially and multiple villae could be seen.   After resecting the meniscus back to stable tissue, the area was copiously  irrigated and all bits of loose material were looked for and irrigated out.  I then closed the portals, injected 12 cc of Marcaine with epinephrine and  applied a bulky dressing including Ace wrap, then held the leg in elevation  for 2 minutes after letting the tourniquet down.  During this time, the cold  pack was in place.   Instructions to the patient will be frequent quad sets and ankle pumps,  ambulate weightbearing as tolerated with straight leg, basically will be at  house rest with elevation of the leg over the weekend, take Darvocet for  pain,  all other usual medications, call me with concerns, otherwise, see me  in the office Monday.                                               Reynolds Bowl, M.D.    JWK/MEDQ  D:  05/07/2002  T:  05/11/2002  Job:  [id number]

## 2011-03-12 ENCOUNTER — Ambulatory Visit: Payer: Self-pay | Admitting: Family Medicine

## 2011-03-12 IMAGING — CR DG SHOULDER 3+V*L*
1 series · 3 of 3 positions shown · non-contrast
Comparison: none

REASON FOR EXAM: Fell.  Pain shoulder upper arm
COMMENTS:

PROCEDURE:     MDR - MDR SHOULDER LEFT COMPLETE  - [DATE] [DATE]
RESULT:     No fracture, dislocation or other acute bony abnormality is
identified.

[Series 1: view not recorded · 0.17mm/px · 3 of 3 slices shown]
[im 1/3]
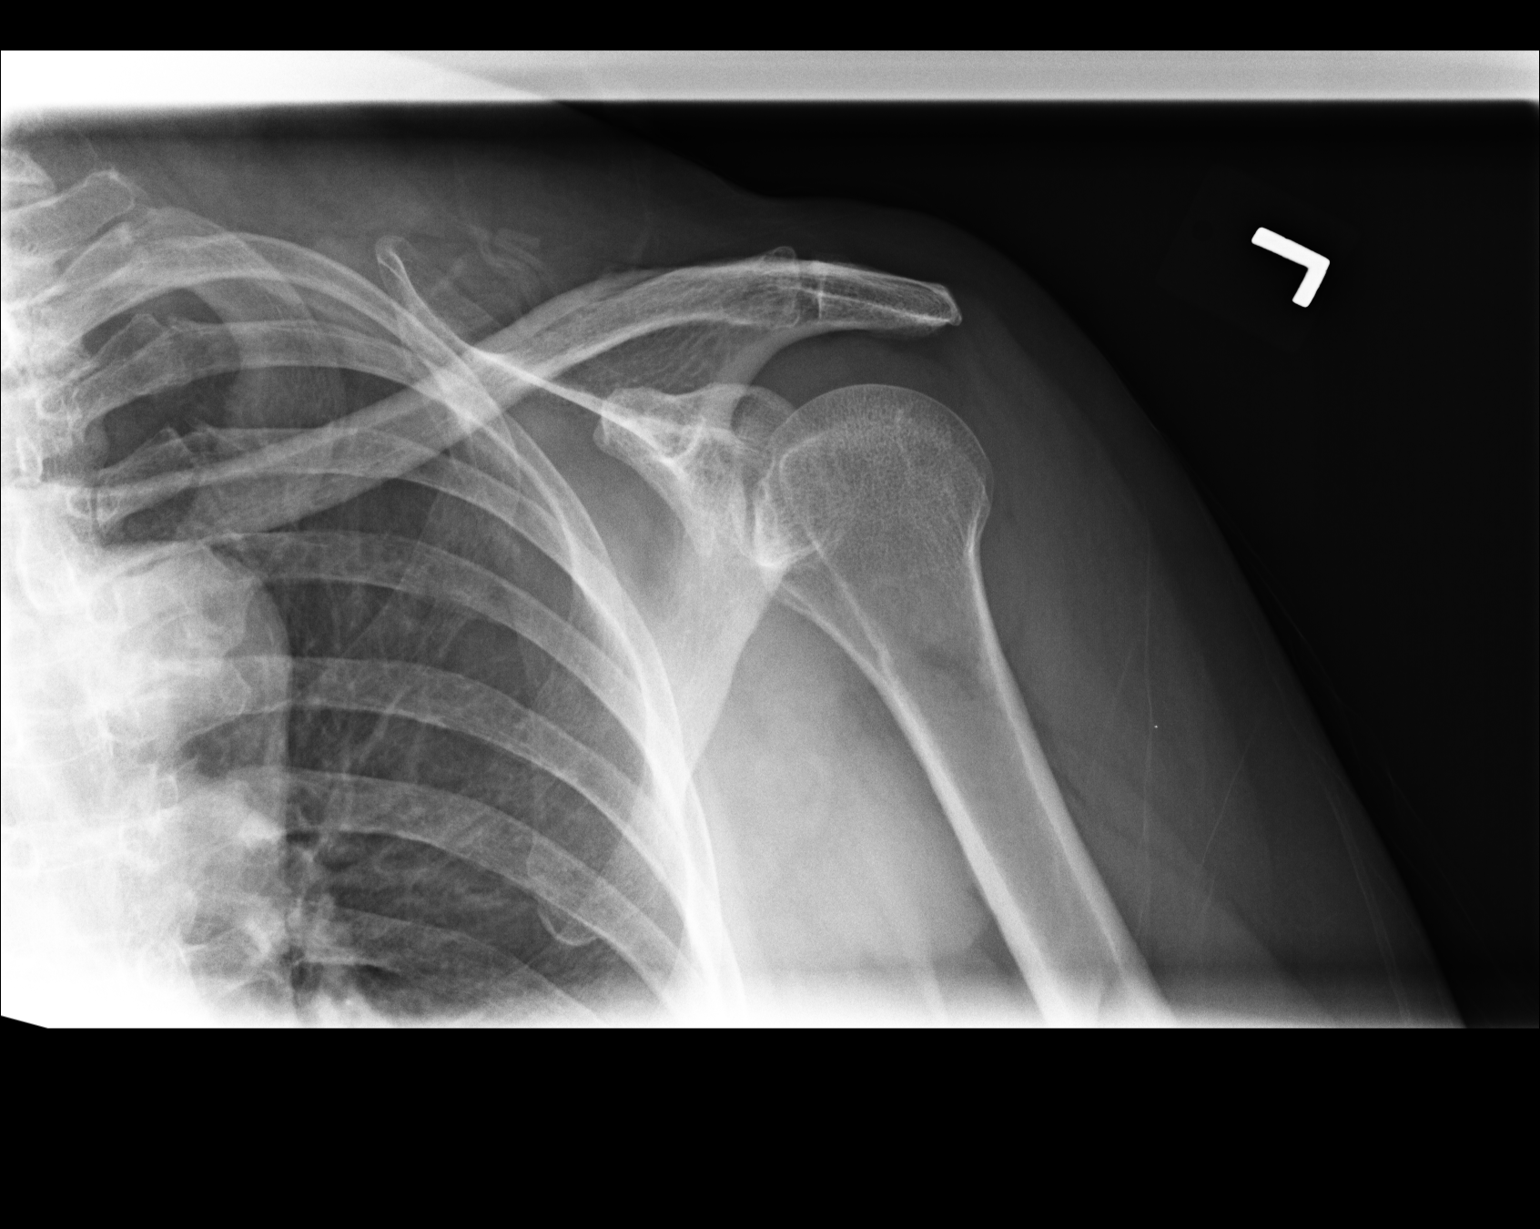
[im 2/3]
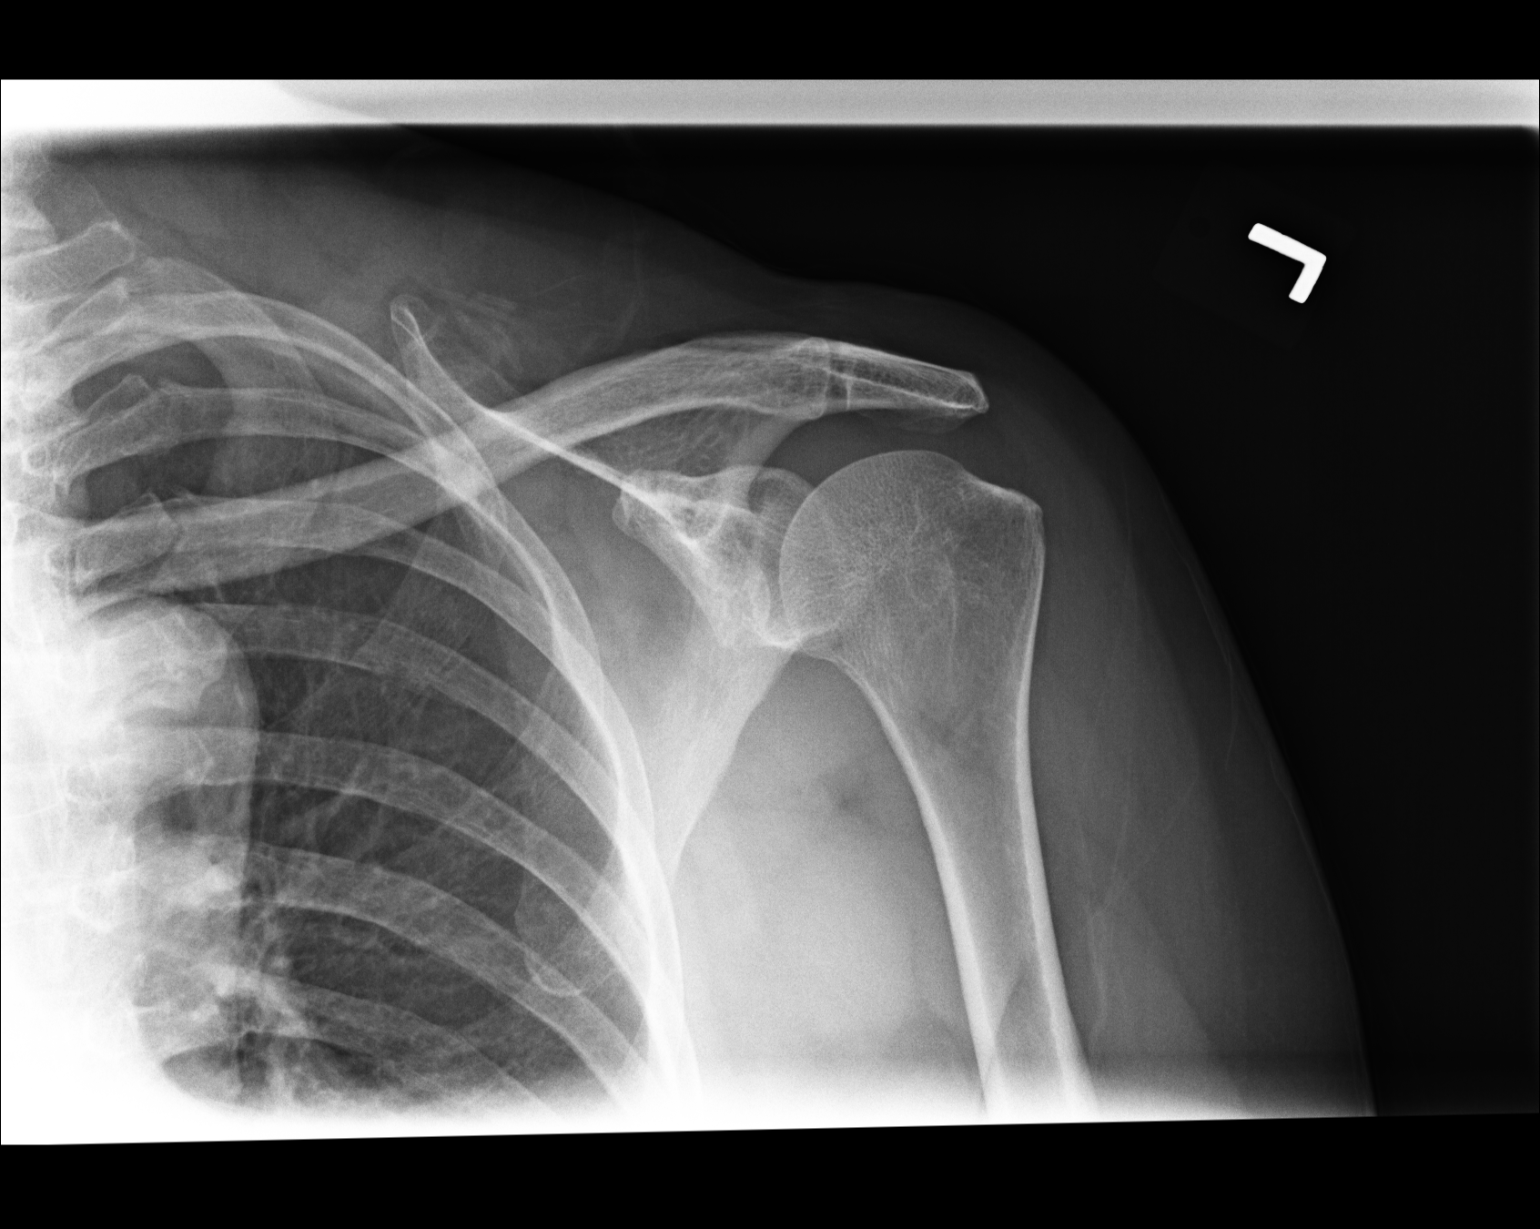
[im 3/3]
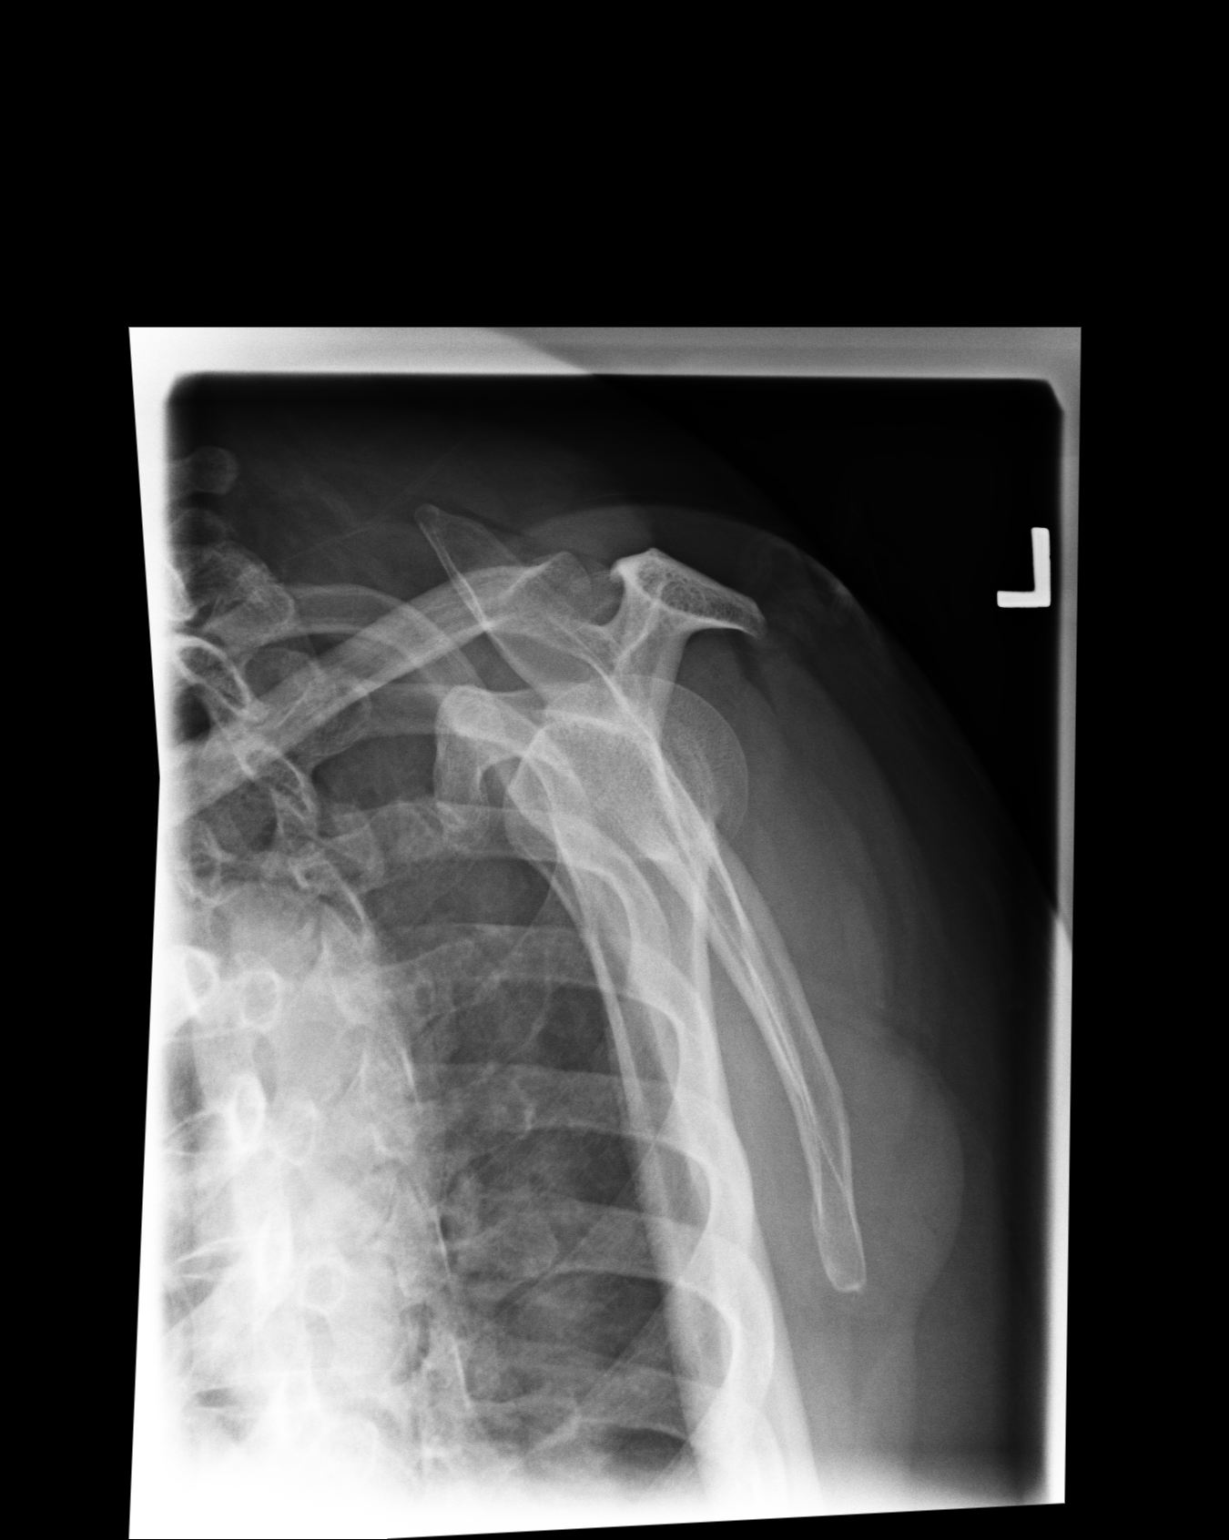

[3 of 3 positions shown; findings below may reference images not displayed]

IMPRESSION: 1.     No significant osseous abnormalities are noted.

## 2011-04-04 ENCOUNTER — Emergency Department: Payer: Self-pay | Admitting: Emergency Medicine

## 2011-04-25 ENCOUNTER — Emergency Department: Payer: Self-pay | Admitting: Emergency Medicine

## 2011-08-02 ENCOUNTER — Ambulatory Visit: Payer: Self-pay | Admitting: Gastroenterology

## 2011-09-25 DIAGNOSIS — N390 Urinary tract infection, site not specified: Secondary | ICD-10-CM

## 2011-09-25 DIAGNOSIS — L02419 Cutaneous abscess of limb, unspecified: Secondary | ICD-10-CM

## 2011-09-25 HISTORY — DX: Cutaneous abscess of limb, unspecified: L02.419

## 2011-09-25 HISTORY — DX: Urinary tract infection, site not specified: N39.0

## 2011-10-19 ENCOUNTER — Inpatient Hospital Stay (HOSPITAL_COMMUNITY)
Admission: EM | Admit: 2011-10-19 | Discharge: 2011-10-22 | DRG: 277 | Disposition: A | Discharge status: Home / Self Care | Payer: BC Managed Care – PPO | Attending: Internal Medicine | Admitting: Internal Medicine

## 2011-10-19 ENCOUNTER — Emergency Department (HOSPITAL_COMMUNITY): Payer: BC Managed Care – PPO

## 2011-10-19 ENCOUNTER — Encounter (HOSPITAL_COMMUNITY): Payer: Self-pay

## 2011-10-19 DIAGNOSIS — L02416 Cutaneous abscess of left lower limb: Secondary | ICD-10-CM

## 2011-10-19 DIAGNOSIS — I10 Essential (primary) hypertension: Secondary | ICD-10-CM

## 2011-10-19 DIAGNOSIS — L02419 Cutaneous abscess of limb, unspecified: Principal | ICD-10-CM | POA: Diagnosis present

## 2011-10-19 DIAGNOSIS — L03116 Cellulitis of left lower limb: Secondary | ICD-10-CM

## 2011-10-19 DIAGNOSIS — Z79899 Other long term (current) drug therapy: Secondary | ICD-10-CM

## 2011-10-19 DIAGNOSIS — E669 Obesity, unspecified: Secondary | ICD-10-CM

## 2011-10-19 DIAGNOSIS — R739 Hyperglycemia, unspecified: Secondary | ICD-10-CM

## 2011-10-19 DIAGNOSIS — R7309 Other abnormal glucose: Secondary | ICD-10-CM | POA: Diagnosis present

## 2011-10-19 DIAGNOSIS — D649 Anemia, unspecified: Secondary | ICD-10-CM

## 2011-10-19 DIAGNOSIS — A4901 Methicillin susceptible Staphylococcus aureus infection, unspecified site: Secondary | ICD-10-CM | POA: Diagnosis present

## 2011-10-19 DIAGNOSIS — N390 Urinary tract infection, site not specified: Secondary | ICD-10-CM

## 2011-10-19 DIAGNOSIS — M1990 Unspecified osteoarthritis, unspecified site: Secondary | ICD-10-CM

## 2011-10-19 DIAGNOSIS — Z6828 Body mass index (BMI) 28.0-28.9, adult: Secondary | ICD-10-CM

## 2011-10-19 HISTORY — DX: Unspecified osteoarthritis, unspecified site: M19.90

## 2011-10-19 HISTORY — DX: Urinary tract infection, site not specified: N39.0

## 2011-10-19 HISTORY — DX: Cutaneous abscess of limb, unspecified: L02.419

## 2011-10-19 HISTORY — DX: Essential (primary) hypertension: I10

## 2011-10-19 HISTORY — DX: Cellulitis of unspecified part of limb: L03.119

## 2011-10-19 LAB — BASIC METABOLIC PANEL
BUN: 17 mg/dL (ref 6–23)
CO2: 26 mEq/L (ref 19–32)
Calcium: 9.4 mg/dL (ref 8.4–10.5)
Chloride: 101 mEq/L (ref 96–112)
Creatinine, Ser: 0.85 mg/dL (ref 0.50–1.10)
Glucose, Bld: 131 mg/dL — ABNORMAL HIGH (ref 70–99)

## 2011-10-19 LAB — CBC
HCT: 35.3 % — ABNORMAL LOW (ref 36.0–46.0)
Hemoglobin: 11.8 g/dL — ABNORMAL LOW (ref 12.0–15.0)
MCV: 83.6 fL (ref 78.0–100.0)
RDW: 14.1 % (ref 11.5–15.5)
WBC: 9.3 10*3/uL (ref 4.0–10.5)

## 2011-10-19 LAB — DIFFERENTIAL
Basophils Absolute: 0 10*3/uL (ref 0.0–0.1)
Eosinophils Relative: 1 % (ref 0–5)
Lymphocytes Relative: 27 % (ref 12–46)
Lymphs Abs: 2.5 10*3/uL (ref 0.7–4.0)
Monocytes Absolute: 0.6 10*3/uL (ref 0.1–1.0)
Monocytes Relative: 6 % (ref 3–12)
Neutro Abs: 6.2 10*3/uL (ref 1.7–7.7)

## 2011-10-19 IMAGING — CR DG KNEE COMPLETE 4+V*L*
4 series · 4 of 4 positions shown · non-contrast
Comparison: None

CLINICAL DATA: Knee pain and swelling.. Purulent discharge.

LEFT KNEE - COMPLETE 4+ VIEW

[view not recorded (1 of 4)]
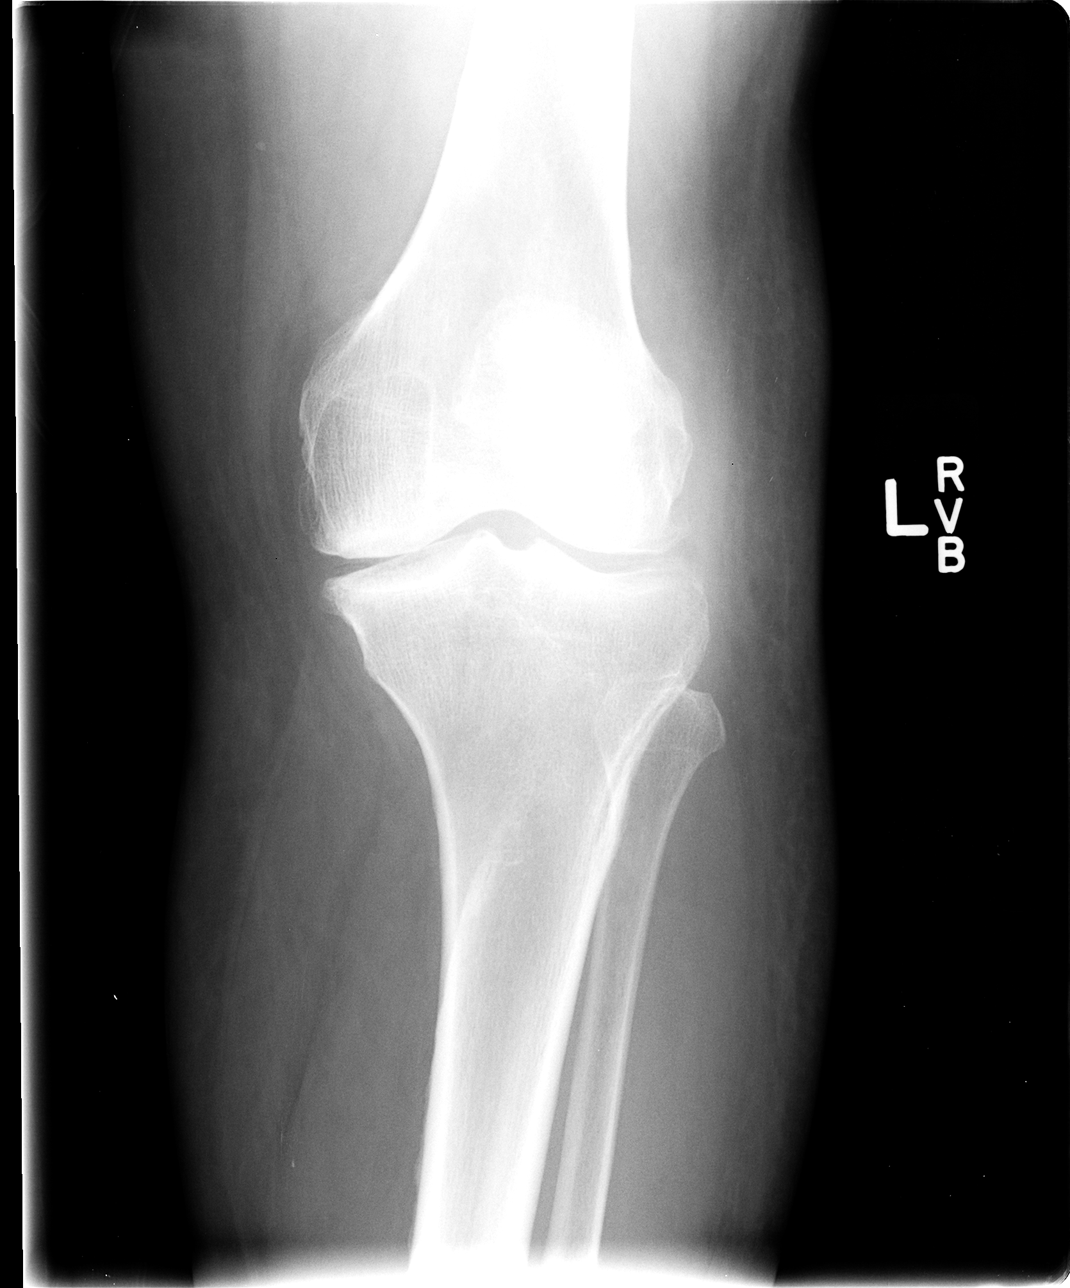

[view not recorded (2 of 4)]
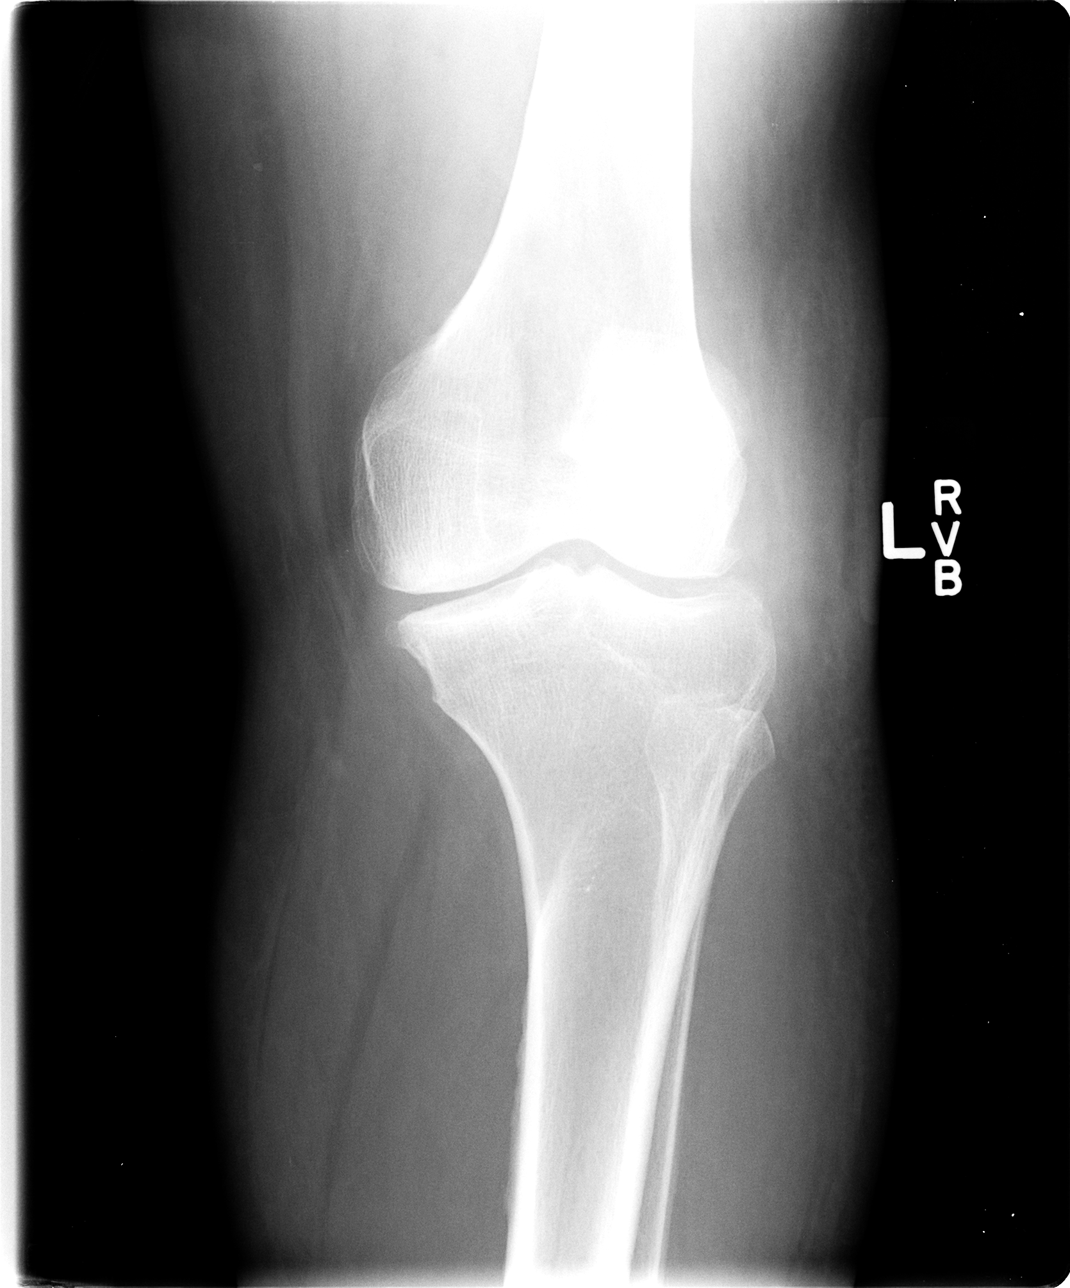

[view not recorded (3 of 4)]
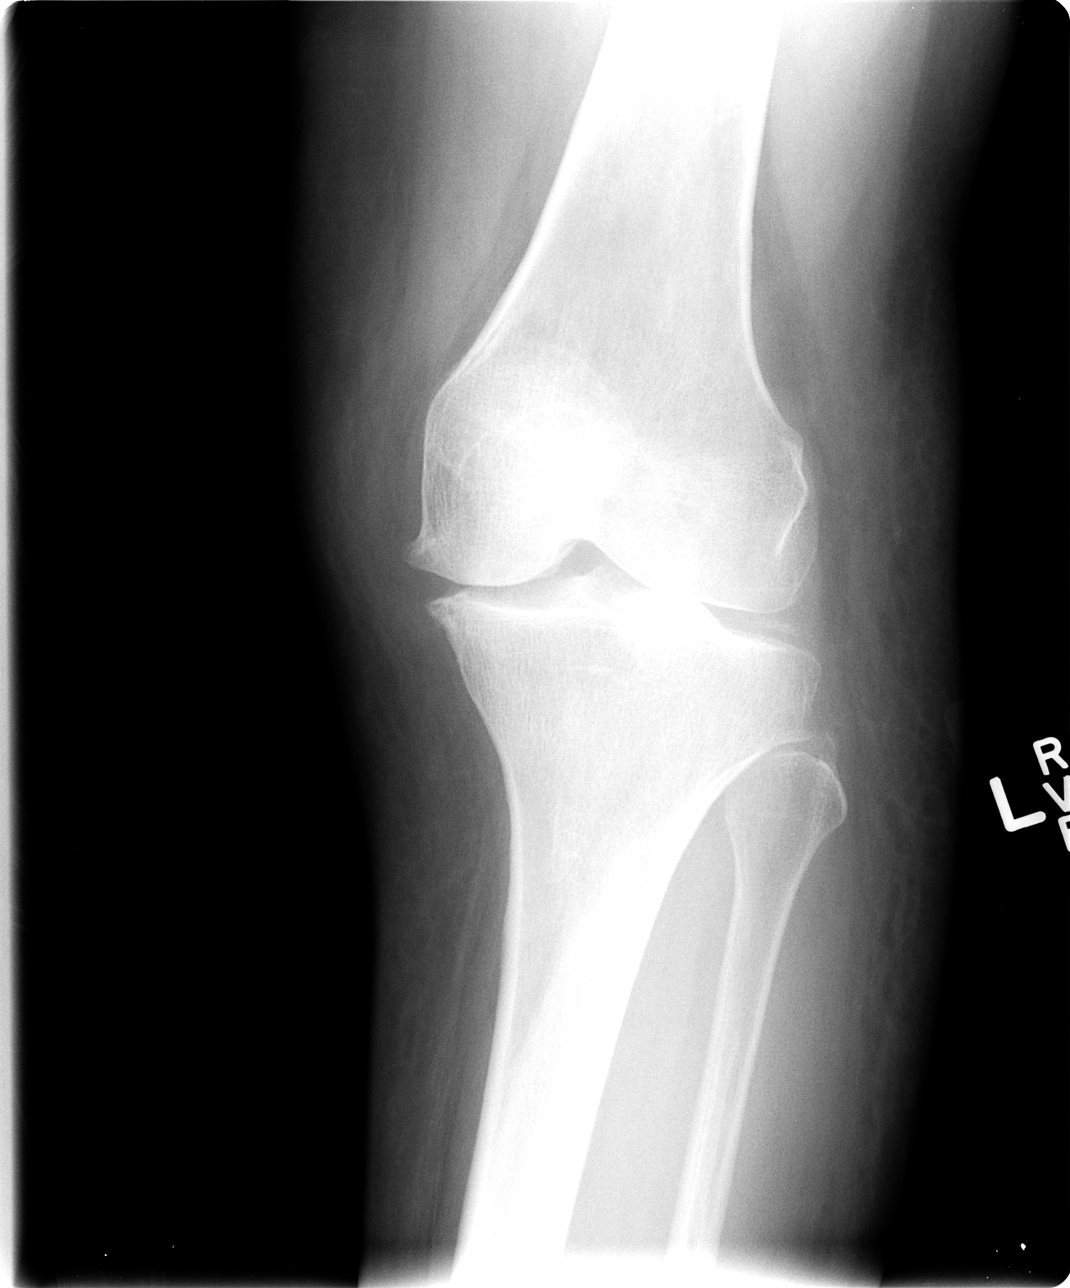

[view not recorded (4 of 4)]
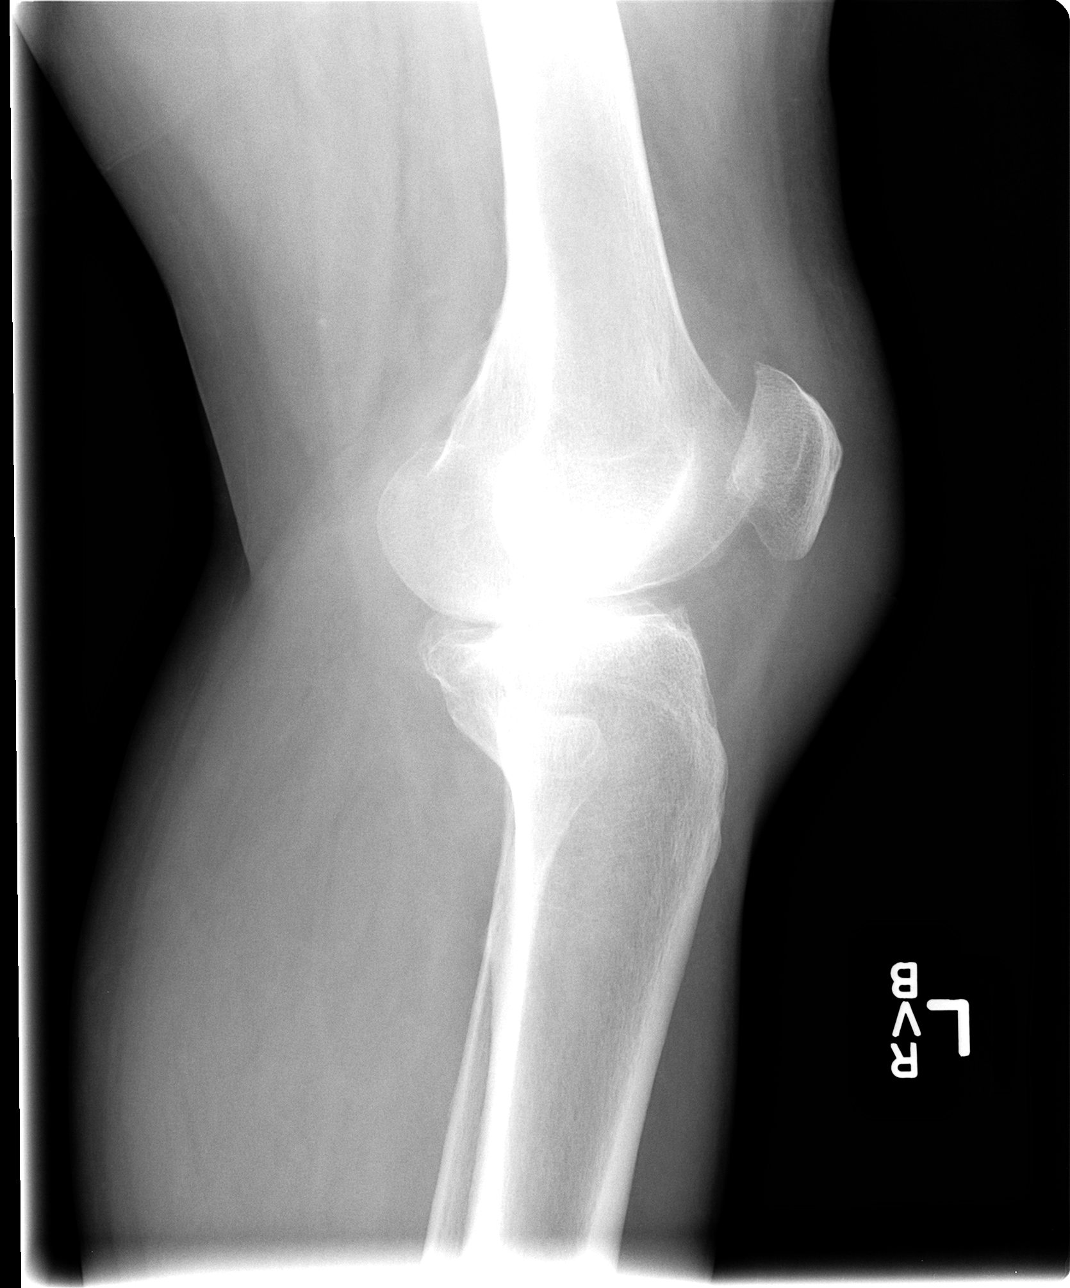

[4 of 4 positions shown; findings below may reference images not displayed]

FINDINGS: Prepatellar soft tissue swelling is seen.  There is no
definite evidence of knee joint effusion.  There is no evidence of
fracture or dislocation.  Mild medial and lateral compartment
osteoarthritis is seen.  Mild lateral compartment chondrocalcinosis
also noted.
IMPRESSION: 1.  Prepatellar soft tissue swelling.  No evidence of knee joint
effusion.
2.  Mild medial and lateral compartment osteoarthritis.

## 2011-10-19 MED ORDER — ONDANSETRON 4 MG PO TBDP
4.0000 mg | ORAL_TABLET | Freq: Once | ORAL | Status: AC
Start: 2011-10-19 — End: 2011-10-19
  Administered 2011-10-19: 4 mg via ORAL
  Filled 2011-10-19: qty 1

## 2011-10-19 MED ORDER — VANCOMYCIN HCL IN DEXTROSE 1-5 GM/200ML-% IV SOLN
1000.0000 mg | Freq: Once | INTRAVENOUS | Status: AC
  Administered 2011-10-19: 1000 mg via INTRAVENOUS
  Filled 2011-10-19: qty 200

## 2011-10-19 MED ORDER — LIDOCAINE HCL (PF) 1 % IJ SOLN
5.0000 mL | Freq: Once | INTRAMUSCULAR | Status: AC
  Administered 2011-10-19: 5 mL
  Filled 2011-10-19: qty 5

## 2011-10-19 MED ORDER — MORPHINE SULFATE 10 MG/ML IJ SOLN
INTRAMUSCULAR | Status: AC
  Administered 2011-10-19: 6 mg via INTRAMUSCULAR
  Filled 2011-10-19: qty 1

## 2011-10-19 MED ORDER — MORPHINE SULFATE 4 MG/ML IJ SOLN: 6.0000 mg | Freq: Once | INTRAMUSCULAR | Status: DC

## 2011-10-19 MED ORDER — SODIUM CHLORIDE 0.9 % IV SOLN
Freq: Once | INTRAVENOUS | Status: AC
  Administered 2011-10-19: 23:00:00 via INTRAVENOUS

## 2011-10-19 MED ORDER — MORPHINE SULFATE 4 MG/ML IJ SOLN: 6.0000 mg | Freq: Once | INTRAMUSCULAR | Status: AC

## 2011-10-19 NOTE — ED Provider Notes (Signed)
Patient's noted to have moderate redness and swelling of her left anterior knee. She has pain to palpation and on attempted range of motion.  Bedside ultrasound done and a small superficial abscess was found underneath a area on the skin where it looks like it has tried to drain. Views were archived and location of abscess was discussed with PA. Consent was given verbally. Time out was performed before procedure. I did it he was verified verbally and by hospital ID bracelet..  Medical screening examination/treatment/procedure(s) were conducted as a shared visit with non-physician practitioner(s) and myself.  I personally evaluated the patient during the encounter Devoria Albe, MD, Franz Dell, MD 10/19/11 2120

## 2011-10-19 NOTE — H&P (Signed)
PCP:   Gabriel Cirri, FNP, [facility]  Chief Complaint:  Pain and swelling left knee for the past few days  HPI: Jennifer Rubio is an 61 y.o. African American female.  Personal history of hypertension, family history of diabetes, was in fair health until she noticed a pimple developed over her left patella. Over the next few days became progressively more swollen and red and inflamed, and now she has tenderness spreading up the inside of her left thigh to her groin. She denies any fever but now has difficulty walking.  Denies dysuria or frequency or polyuria  In the emergency room patient had incision and drainage and packing of abscess over the patellar, cultures were sent, and the hospitalist service called to assist.  Rewiew of Systems:  The patient denies anorexia, fever, weight loss,, vision loss, decreased hearing, hoarseness, chest pain, syncope, dyspnea on exertion, peripheral edema, balance deficits, hemoptysis, abdominal pain, melena, hematochezia, severe indigestion/heartburn, hematuria, incontinence, genital sores, muscle weakness, suspicious skin lesions, transient blindness,  depression, unusual weight change, abnormal bleeding, enlarged lymph nodes, angioedema, and breast masses.  Past Medical History  Diagnosis Date  . Hypertension   . Osteoarthritis     Past Surgical History  Procedure Date  . Abdominal hysterectomy   . Knee arthroscopy     both knees    Medications:  HOME MEDS: Prior to Admission medications   Medication Sig Start Date End Date Taking? Authorizing Provider  amLODipine (NORVASC) 5 MG tablet Take 5 mg by mouth every morning.   Yes Historical Provider, MD  lisinopril-hydrochlorothiazide (PRINZIDE,ZESTORETIC) 20-12.5 MG per tablet Take 1 tablet by mouth every morning.   Yes Historical Provider, MD     Allergies:  Allergies  Allergen Reactions  . Sulfa Antibiotics Anaphylaxis    Swelling     Social History:   reports that she  has never smoked. She does not have any smokeless tobacco history on file. She reports that she does not drink alcohol or use illicit drugs.  Family History: Family History  Problem Relation Age of Onset  . Hypertension Father   . Diabetes Sister   . Diabetes Brother      Physical Exam: Filed Vitals:   10/19/11 1936 10/19/11 2202  BP: 101/67 95/64  Pulse: 109 98  Temp: 98.2 F (36.8 C) 99.2 F (37.3 C)  TempSrc: Oral Oral  Resp: 18 20  Height: 5\' 1"  (1.549 m)   Weight: 69.4 kg (153 lb)   SpO2: 97% 98%   Blood pressure 95/64, pulse 98, temperature 99.2 F (37.3 C), temperature source Oral, resp. rate 20, height 5\' 1"  (1.549 m), weight 69.4 kg (153 lb), SpO2 98.00%.  GEN:  Pleasant obese African American lady lying in the stretcher in moderate painful; cooperative with exam PSYCH:  alert and oriented x4; does not appear anxious does not appear depressed; affect is appropriate HEENT: Mucous membranes pink and anicteric; PERRLA; EOM intact; no cervical lymphadenopathy nor thyromegaly or carotid bruit; no JVD; Breasts:: Not examined CHEST WALL: No tenderness CHEST: Normal respiration, clear to auscultation bilaterally HEART: Regular rate and rhythm; no murmurs rubs or gallops BACK: No kyphosis or scoliosis; no CVA tenderness ABDOMEN: Obese, soft non-tender; no masses, no organomegaly, normal abdominal bowel sounds; Rectal Exam: Not done EXTREMITIES: Packed drained abscess over the left patella; inflamed tender area extending over the anterior knee a couple of inches below the knee tenderness on both sides of the knee, tenderness along the inner thigh but no cord felt, tenderness  in the left groin no nodes distinctly felt possibly because of obesity. Genitalia: not examined PULSES: 2+ and symmetric SKIN: Normal hydration no rash or ulceration CNS: Cranial nerves 2-12 grossly intact no focal neurologic deficit   Labs & Imaging Results for orders placed during the hospital  encounter of 10/19/11 (from the past 48 hour(s))  CBC     Status: Abnormal   Collection Time   10/19/11 10:04 PM      Component Value Range Comment   WBC 9.3  4.0 - 10.5 (K/uL)    RBC 4.22  3.87 - 5.11 (MIL/uL)    Hemoglobin 11.8 (*) 12.0 - 15.0 (g/dL)    HCT 13.0 (*) 86.5 - 46.0 (%)    MCV 83.6  78.0 - 100.0 (fL)    MCH 28.0  26.0 - 34.0 (pg)    MCHC 33.4  30.0 - 36.0 (g/dL)    RDW 78.4  69.6 - 29.5 (%)    Platelets 257  150 - 400 (K/uL)   DIFFERENTIAL     Status: Normal   Collection Time   10/19/11 10:04 PM      Component Value Range Comment   Neutrophils Relative 66  43 - 77 (%)    Neutro Abs 6.2  1.7 - 7.7 (K/uL)    Lymphocytes Relative 27  12 - 46 (%)    Lymphs Abs 2.5  0.7 - 4.0 (K/uL)    Monocytes Relative 6  3 - 12 (%)    Monocytes Absolute 0.6  0.1 - 1.0 (K/uL)    Eosinophils Relative 1  0 - 5 (%)    Eosinophils Absolute 0.1  0.0 - 0.7 (K/uL)    Basophils Relative 0  0 - 1 (%)    Basophils Absolute 0.0  0.0 - 0.1 (K/uL)   BASIC METABOLIC PANEL     Status: Abnormal   Collection Time   10/19/11 10:04 PM      Component Value Range Comment   Sodium 136  135 - 145 (mEq/L)    Potassium 3.6  3.5 - 5.1 (mEq/L)    Chloride 101  96 - 112 (mEq/L)    CO2 26  19 - 32 (mEq/L)    Glucose, Bld 131 (*) 70 - 99 (mg/dL)    BUN 17  6 - 23 (mg/dL)    Creatinine, Ser 2.84  0.50 - 1.10 (mg/dL)    Calcium 9.4  8.4 - 10.5 (mg/dL)    GFR calc non Af Amer 74 (*) >90 (mL/min)    GFR calc Af Amer 85 (*) >90 (mL/min)    No results found.    Assessment Present on Admission:  .Cellulitis of knee, left .HTN (hypertension), benign .Obesity .Hyperglycemia   PLAN: Patient has significant impairments of mobility will admit for intravenous antibiotic therapy. And pain control. Because of the tenderness going up her left I will get a d-dimer to help with risk stratification for DVT Advised on diet Take a hemoglobin A1c considering her risk factors.  Other plans as per  orders.    Lashaunta Sicard 10/19/2011, 11:54 PM

## 2011-10-19 NOTE — ED Notes (Signed)
Pt presents with "bump" on left knee since Wednesday. Pt states bump popped and drained fluid. Pt states knee is swollen and painful.

## 2011-10-19 NOTE — ED Provider Notes (Signed)
History     CSN: [id number]  Arrival date & time 10/19/11  1931   First MD Initiated Contact with Patient 10/19/11 1945      Chief Complaint  Patient presents with  . Abscess    (Consider location/radiation/quality/duration/timing/severity/associated sxs/prior treatment) HPI Comments: Patient complains of redness, pain and swelling to her left knee for 2 days. She states that she noticed a "pimple" to her anterior knee several days ago. She states that it popped yesterday and a small amount of clear fluid drained from the area.  The pain in her knee became worse this evening after coming home from work.  The pain improves somewhat with rest and increases with standing or ambulation.  She reports having a history of previous boils.  She denies a known history of MRSA.  She denies fever, abdominal pain, or vomiting.  Patient is a 61 y.o. female presenting with abscess. The history is provided by the patient. No language interpreter was used.  Abscess  This is a new problem. The current episode started less than one week ago. The onset was gradual. The problem occurs continuously. The problem has been gradually worsening. Affected Location: left knee. The abscess is characterized by painfulness, redness and swelling. It is unknown what she was exposed to. The abscess first occurred at home. Pertinent negatives include no fever, no fussiness, no vomiting and no sore throat. Her past medical history is significant for skin abscesses in family. There were sick contacts at work. She has received no recent medical care.    Past Medical History  Diagnosis Date  . Hypertension     Past Surgical History  Procedure Date  . Abdominal hysterectomy     History reviewed. No pertinent family history.  History  Substance Use Topics  . Smoking status: Never Smoker   . Smokeless tobacco: Not on file  . Alcohol Use: No    OB History    Grav Para Term Preterm Abortions TAB SAB Ect Mult Living                 Review of Systems  Constitutional: Negative for fever and appetite change.  HENT: Negative for sore throat.   Respiratory: Negative for chest tightness, shortness of breath and wheezing.   Gastrointestinal: Negative for nausea and vomiting.  Skin: Positive for color change.       abscess  Neurological: Negative for dizziness and headaches.  Hematological: Negative for adenopathy.  All other systems reviewed and are negative.    Allergies  Sulfa antibiotics  Home Medications   Current Outpatient Rx  Name Route Sig Dispense Refill  . AMLODIPINE BESYLATE 5 MG PO TABS Oral Take 5 mg by mouth every morning.    Marland Kitchen LISINOPRIL-HYDROCHLOROTHIAZIDE 20-12.5 MG PO TABS Oral Take 1 tablet by mouth every morning.      BP 101/67  Pulse 109  Temp(Src) 98.2 F (36.8 C) (Oral)  Resp 18  Ht 5\' 1"  (1.549 m)  Wt 153 lb (69.4 kg)  BMI 28.91 kg/m2  SpO2 97%  Physical Exam  Nursing note and vitals reviewed. Constitutional: She is oriented to person, place, and time. She appears well-developed and well-nourished. No distress.  HENT:  Head: Normocephalic and atraumatic.  Cardiovascular: Normal rate, regular rhythm, normal heart sounds and intact distal pulses.   No murmur heard. Pulmonary/Chest: Effort normal and breath sounds normal.  Musculoskeletal: She exhibits edema and tenderness.       Left knee: She exhibits decreased range of motion, swelling  and erythema. She exhibits no ecchymosis and no laceration. tenderness found. Patellar tendon tenderness noted.       Legs:      Moderate erythema and edema of the anterior left knee. Small abscess present. No apparent drainage. Range of motion of the left knee is limited due to pain.  Neurological: She is alert and oriented to person, place, and time. She exhibits normal muscle tone. Coordination normal.  Skin: Skin is warm and dry.       See MS exam    ED Course  Procedures (including critical care time)  Results for  orders placed during the hospital encounter of 10/19/11  CBC      Component Value Range   WBC 9.3  4.0 - 10.5 (K/uL)   RBC 4.22  3.87 - 5.11 (MIL/uL)   Hemoglobin 11.8 (*) 12.0 - 15.0 (g/dL)   HCT 11.9 (*) 14.7 - 46.0 (%)   MCV 83.6  78.0 - 100.0 (fL)   MCH 28.0  26.0 - 34.0 (pg)   MCHC 33.4  30.0 - 36.0 (g/dL)   RDW 82.9  56.2 - 13.0 (%)   Platelets 257  150 - 400 (K/uL)  DIFFERENTIAL      Component Value Range   Neutrophils Relative 66  43 - 77 (%)   Neutro Abs 6.2  1.7 - 7.7 (K/uL)   Lymphocytes Relative 27  12 - 46 (%)   Lymphs Abs 2.5  0.7 - 4.0 (K/uL)   Monocytes Relative 6  3 - 12 (%)   Monocytes Absolute 0.6  0.1 - 1.0 (K/uL)   Eosinophils Relative 1  0 - 5 (%)   Eosinophils Absolute 0.1  0.0 - 0.7 (K/uL)   Basophils Relative 0  0 - 1 (%)   Basophils Absolute 0.0  0.0 - 0.1 (K/uL)  BASIC METABOLIC PANEL      Component Value Range   Sodium 136  135 - 145 (mEq/L)   Potassium 3.6  3.5 - 5.1 (mEq/L)   Chloride 101  96 - 112 (mEq/L)   CO2 26  19 - 32 (mEq/L)   Glucose, Bld 131 (*) 70 - 99 (mg/dL)   BUN 17  6 - 23 (mg/dL)   Creatinine, Ser 8.65  0.50 - 1.10 (mg/dL)   Calcium 9.4  8.4 - 78.4 (mg/dL)   GFR calc non Af Amer 74 (*) >90 (mL/min)   GFR calc Af Amer 85 (*) >90 (mL/min)     INCISION AND DRAINAGE Performed by: Maxwell Caul. Consent: Verbal consent obtained. Risks and benefits: risks, benefits and alternatives were discussed Type: abscess  Body area: left anterior knee Anesthesia: local infiltration  Local anesthetic: lidocaine 1% w/o epinephrine  Anesthetic total: 3 ml  Complexity: complex Blunt dissection to break up loculations  Drainage: purulent  Drainage amount: moderate Packing material: 1/4 in iodoform gauze  Patient tolerance: Patient tolerated the procedure well with no immediate complications.    Abscess culture is pending   MDM     Vitals are stable. Patient is nontoxic appearing.  Moderate erythema and edema of the knee.  Likely cellulitis as well as abscess.  I will consult hospitalist for admission.     11:00 pm I have consulted Dr. Orvan Falconer.  He will see patient in ED and arrange for admission  Amberia Bayless L. [place], [place] 10/19/11 2337

## 2011-10-19 NOTE — ED Notes (Signed)
Pt states that she noticed a "bump" on the top of her left knee on Wednesday, states that the area 'popped", pt continues to have pain and now swelling to left knee, pt has swelling and redness noted to left knee area that extends toward the calf area,

## 2011-10-20 ENCOUNTER — Encounter (HOSPITAL_COMMUNITY): Payer: Self-pay | Admitting: *Deleted

## 2011-10-20 LAB — URINALYSIS, ROUTINE W REFLEX MICROSCOPIC
Bilirubin Urine: NEGATIVE
Glucose, UA: NEGATIVE mg/dL
Ketones, ur: NEGATIVE mg/dL
Protein, ur: NEGATIVE mg/dL
pH: 5.5 (ref 5.0–8.0)

## 2011-10-20 LAB — BASIC METABOLIC PANEL
BUN: 15 mg/dL (ref 6–23)
CO2: 28 mEq/L (ref 19–32)
Calcium: 8.9 mg/dL (ref 8.4–10.5)
Chloride: 103 mEq/L (ref 96–112)
Creatinine, Ser: 0.81 mg/dL (ref 0.50–1.10)

## 2011-10-20 LAB — CBC
HCT: 33.1 % — ABNORMAL LOW (ref 36.0–46.0)
MCH: 27.9 pg (ref 26.0–34.0)
MCHC: 33.2 g/dL (ref 30.0–36.0)
MCV: 84 fL (ref 78.0–100.0)
Platelets: 263 10*3/uL (ref 150–400)
RDW: 14.3 % (ref 11.5–15.5)
WBC: 7.6 10*3/uL (ref 4.0–10.5)

## 2011-10-20 LAB — HEPATIC FUNCTION PANEL
Bilirubin, Direct: 0.1 mg/dL (ref 0.0–0.3)
Indirect Bilirubin: 0.2 mg/dL — ABNORMAL LOW (ref 0.3–0.9)
Total Protein: 7.6 g/dL (ref 6.0–8.3)

## 2011-10-20 LAB — D-DIMER, QUANTITATIVE: D-Dimer, Quant: 0.36 ug/mL-FEU (ref 0.00–0.48)

## 2011-10-20 LAB — URINE MICROSCOPIC-ADD ON

## 2011-10-20 LAB — MAGNESIUM: Magnesium: 2.2 mg/dL (ref 1.5–2.5)

## 2011-10-20 LAB — HEMOGLOBIN A1C: Mean Plasma Glucose: 123 mg/dL — ABNORMAL HIGH (ref <117)

## 2011-10-20 MED ORDER — OXYCODONE HCL 5 MG PO TABS
5.0000 mg | ORAL_TABLET | ORAL | Status: DC | PRN
  Administered 2011-10-20: 5 mg via ORAL
  Filled 2011-10-20: qty 1

## 2011-10-20 MED ORDER — BISACODYL 10 MG RE SUPP: 10.0000 mg | Freq: Every day | RECTAL | Status: DC | PRN

## 2011-10-20 MED ORDER — HYDROMORPHONE HCL PF 1 MG/ML IJ SOLN: 0.5000 mg | INTRAMUSCULAR | Status: DC | PRN

## 2011-10-20 MED ORDER — TRAZODONE HCL 50 MG PO TABS: 25.0000 mg | ORAL_TABLET | Freq: Every evening | ORAL | Status: DC | PRN

## 2011-10-20 MED ORDER — AMLODIPINE BESYLATE 5 MG PO TABS
5.0000 mg | ORAL_TABLET | Freq: Every day | ORAL | Status: DC
  Administered 2011-10-20: 5 mg via ORAL
  Filled 2011-10-20 (×3): qty 1

## 2011-10-20 MED ORDER — POTASSIUM CHLORIDE IN NACL 20-0.9 MEQ/L-% IV SOLN
INTRAVENOUS | Status: DC
  Administered 2011-10-20 – 2011-10-22 (×4): via INTRAVENOUS

## 2011-10-20 MED ORDER — LISINOPRIL 10 MG PO TABS
40.0000 mg | ORAL_TABLET | Freq: Every day | ORAL | Status: DC
  Administered 2011-10-20: 40 mg via ORAL
  Filled 2011-10-20: qty 4
  Filled 2011-10-20: qty 1
  Filled 2011-10-20: qty 4
  Filled 2011-10-20: qty 3

## 2011-10-20 MED ORDER — ACETAMINOPHEN 325 MG PO TABS
650.0000 mg | ORAL_TABLET | Freq: Four times a day (QID) | ORAL | Status: DC | PRN
  Administered 2011-10-20: 650 mg via ORAL
  Filled 2011-10-20: qty 2

## 2011-10-20 MED ORDER — ONDANSETRON HCL 4 MG/2ML IJ SOLN: 4.0000 mg | Freq: Four times a day (QID) | INTRAMUSCULAR | Status: DC | PRN

## 2011-10-20 MED ORDER — ONDANSETRON HCL 4 MG PO TABS: 4.0000 mg | ORAL_TABLET | Freq: Four times a day (QID) | ORAL | Status: DC | PRN

## 2011-10-20 MED ORDER — VANCOMYCIN HCL 1000 MG IV SOLR
750.0000 mg | Freq: Two times a day (BID) | INTRAVENOUS | Status: DC
  Administered 2011-10-20 – 2011-10-22 (×5): 750 mg via INTRAVENOUS
  Filled 2011-10-20 (×7): qty 750

## 2011-10-20 MED ORDER — ACETAMINOPHEN 650 MG RE SUPP: 650.0000 mg | Freq: Four times a day (QID) | RECTAL | Status: DC | PRN

## 2011-10-20 MED ORDER — ENOXAPARIN SODIUM 40 MG/0.4ML ~~LOC~~ SOLN
40.0000 mg | Freq: Every day | SUBCUTANEOUS | Status: DC
  Administered 2011-10-20 – 2011-10-22 (×3): 40 mg via SUBCUTANEOUS
  Filled 2011-10-20 (×3): qty 0.4

## 2011-10-20 MED ORDER — FLEET ENEMA 7-19 GM/118ML RE ENEM: 1.0000 | ENEMA | Freq: Once | RECTAL | Status: AC | PRN

## 2011-10-20 MED ORDER — POLYETHYLENE GLYCOL 3350 17 G PO PACK: 17.0000 g | PACK | Freq: Every day | ORAL | Status: DC | PRN

## 2011-10-20 NOTE — Progress Notes (Signed)
ANTIBIOTIC CONSULT NOTE - INITIAL  Pharmacy Consult for Vancomycin  Indication:  Cellulitis / Abscess left knee  Allergies  Allergen Reactions  . Sulfa Antibiotics Anaphylaxis    Swelling     Patient Measurements: Height: 5\' 1"  (154.9 cm) Weight: 151 lb 8 oz (68.72 kg) IBW/kg (Calculated) : 47.8    Vital Signs: Temp: 98.2 F (36.8 C) (01/26 0641) Temp src: Oral (01/26 0641) BP: 93/62 mmHg (01/26 0641) Pulse Rate: 83  (01/26 0641) Intake/Output from previous day: 01/25 0701 - 01/26 0700 In: -  Out: 100 [Urine:100] Intake/Output from this shift:    Labs:  Basename 10/20/11 0630 10/19/11 2204  WBC 7.6 9.3  HGB 11.0* 11.8*  PLT 263 257  LABCREA -- --  CREATININE -- 0.85   Estimated Creatinine Clearance: 63.2 ml/min (by C-G formula based on Cr of 0.85). No results found for this basename: VANCOTROUGH:2,VANCOPEAK:2,VANCORANDOM:2,GENTTROUGH:2,GENTPEAK:2,GENTRANDOM:2,TOBRATROUGH:2,TOBRAPEAK:2,TOBRARND:2,AMIKACINPEAK:2,AMIKACINTROU:2,AMIKACIN:2, in the last 72 hours   Microbiology: No results found for this or any previous visit (from the past 720 hour(s)).  Medical History: Past Medical History  Diagnosis Date  . Hypertension   . Osteoarthritis     Medications:  Scheduled:    . sodium chloride   Intravenous Once  . amLODipine  5 mg Oral Daily  . enoxaparin  40 mg Subcutaneous Daily  . lidocaine  5 mL Other Once  . lisinopril  40 mg Oral Daily  . morphine      .  morphine injection  6 mg Intramuscular Once  . ondansetron  4 mg Oral Once  . vancomycin  750 mg Intravenous Q12H  . vancomycin  1,000 mg Intravenous Once  . DISCONTD: morphine  6 mg Intravenous Once   Assessment: Ok for protocol   Goal of Therapy:  Vancomycin trough level 10-15 mcg/ml  Plan:  Vancomycin 750 mg IV every 12 hours  (Vancomycin 1 GM given in ER) Measure antibiotic drug levels at steady state Labs per protocol  Raquel James, Leasha Goldberger Bennett 10/20/2011,7:18 AM

## 2011-10-20 NOTE — Progress Notes (Signed)
Chart reviewed  Subjective: Leg less painful, less red. No dysuria, frequency, SP or flank pain  Objective: Vital signs in last 24 hours: Filed Vitals:   10/20/11 0036 10/20/11 0500 10/20/11 0641 10/20/11 1000  BP: 90/56  93/62 100/64  Pulse: 94  83 84  Temp: 98.3 F (36.8 C)  98.2 F (36.8 C) 98.4 F (36.9 C)  TempSrc: Oral  Oral Oral  Resp: 18  18 19   Height: 5\' 1"  (1.549 m)     Weight: 68.5 kg (151 lb 0.2 oz) 68.72 kg (151 lb 8 oz)    SpO2: 96%  96% 97%   Weight change:   Intake/Output Summary (Last 24 hours) at 10/20/11 1331 Last data filed at 10/20/11 0424  Gross per 24 hour  Intake      0 ml  Output    100 ml  Net   -100 ml   Physical Exam: General:  Non-toxic. Lungs: CTA w/o WRR CV:  RRR w/o MGR Abd:  S, NT, ND Ext:  Left knee with wick and minimal purulent drainage. Mild induration and tenderness below knee and above.  Per patient, less extensive today.  Lab Results: Basic Metabolic Panel:  Lab 10/20/11 4098 10/20/11 0040 10/19/11 2204  NA 137 -- 136  K 3.7 -- 3.6  CL 103 -- 101  CO2 28 -- 26  GLUCOSE 102* -- 131*  BUN 15 -- 17  CREATININE 0.81 -- 0.85  CALCIUM 8.9 -- 9.4  MG -- 2.2 --  PHOS -- -- --   Liver Function Tests:  Lab 10/20/11 0040  AST 15  ALT 12  ALKPHOS 84  BILITOT 0.3  PROT 7.6  ALBUMIN 3.5   No results found for this basename: LIPASE:2,AMYLASE:2 in the last 168 hours No results found for this basename: AMMONIA:2 in the last 168 hours CBC:  Lab 10/20/11 0630 10/19/11 2204  WBC 7.6 9.3  NEUTROABS -- 6.2  HGB 11.0* 11.8*  HCT 33.1* 35.3*  MCV 84.0 83.6  PLT 263 257   Cardiac Enzymes: No results found for this basename: CKTOTAL:3,CKMB:3,CKMBINDEX:3,TROPONINI:3 in the last 168 hours BNP: No results found for this basename: PROBNP:3 in the last 168 hours D-Dimer:  Lab 10/19/11 2204  DDIMER 0.36   CBG: No results found for this basename: GLUCAP:6 in the last 168 hours Hemoglobin A1C: No results found for this  basename: HGBA1C in the last 168 hours Fasting Lipid Panel: No results found for this basename: CHOL,HDL,LDLCALC,TRIG,CHOLHDL,LDLDIRECT in the last 119 hours Thyroid Function Tests: No results found for this basename: TSH,T4TOTAL,FREET4,T3FREE,THYROIDAB in the last 168 hours Coagulation: No results found for this basename: LABPROT:4,INR:4 in the last 168 hours Anemia Panel: No results found for this basename: VITAMINB12,FOLATE,FERRITIN,TIBC,IRON,RETICCTPCT in the last 168 hours Urine Drug Screen: Drugs of Abuse  No results found for this basename: labopia, cocainscrnur, labbenz, amphetmu, thcu, labbarb    Alcohol Level: No results found for this basename: ETH:2 in the last 168 hours Urinalysis: Contaminated with many epis  Micro Results: No results found for this or any previous visit (from the past 240 hour(s)). Studies/Results: Dg Knee Complete 4 Views Left  10/20/2011  *RADIOLOGY REPORT*  Clinical Data: Knee pain and swelling. Purulent discharge.  LEFT KNEE - COMPLETE 4+ VIEW  Comparison: None  Findings: Prepatellar soft tissue swelling is seen.  There is no definite evidence of knee joint effusion.  There is no evidence of fracture or dislocation.  Mild medial and lateral compartment osteoarthritis is seen.  Mild lateral compartment chondrocalcinosis  also noted.  IMPRESSION:  1.  Prepatellar soft tissue swelling.  No evidence of knee joint effusion. 2.  Mild medial and lateral compartment osteoarthritis.  Original Report Authenticated By: Danae Orleans, M.D.   Scheduled Meds:   . sodium chloride   Intravenous Once  . amLODipine  5 mg Oral Daily  . enoxaparin  40 mg Subcutaneous Daily  . lidocaine  5 mL Other Once  . lisinopril  40 mg Oral Daily  . morphine      .  morphine injection  6 mg Intramuscular Once  . ondansetron  4 mg Oral Once  . vancomycin  750 mg Intravenous Q12H  . vancomycin  1,000 mg Intravenous Once  . DISCONTD: morphine  6 mg Intravenous Once   Continuous  Infusions:   . 0.9 % NaCl with KCl 20 mEq / L 50 mL/hr at 10/20/11 0047   PRN Meds:.acetaminophen, acetaminophen, bisacodyl, HYDROmorphone, ondansetron (ZOFRAN) IV, ondansetron, oxyCODONE, polyethylene glycol, sodium phosphate, traZODone Assessment/Plan: Active Problems:  Abcess, Cellulitis of knee, left; s/p I&D: improving. Cont vancomycin  HTN (hypertension), benign  Obesity  Osteoarthritis  Hyperglycemia: fasting glucose ok.  Contaminated UA. No urinary sx. Not sure why it was ordered. No Rx or further w/u.   LOS: 1 day   Jennifer Rubio L 10/20/2011, 1:31 PM

## 2011-10-21 DIAGNOSIS — D649 Anemia, unspecified: Secondary | ICD-10-CM | POA: Diagnosis not present

## 2011-10-21 DIAGNOSIS — N390 Urinary tract infection, site not specified: Secondary | ICD-10-CM | POA: Diagnosis present

## 2011-10-21 MED ORDER — DEXTROSE 5 % IV SOLN
1.0000 g | INTRAVENOUS | Status: DC
  Administered 2011-10-21 – 2011-10-22 (×2): 1 g via INTRAVENOUS
  Filled 2011-10-21 (×3): qty 10

## 2011-10-21 MED ORDER — SODIUM CHLORIDE 0.9 % IJ SOLN
INTRAMUSCULAR | Status: AC
  Administered 2011-10-21: 19:00:00
  Filled 2011-10-21: qty 3

## 2011-10-21 NOTE — ED Provider Notes (Signed)
See prior note   Ward Givens, MD 10/21/11 1305

## 2011-10-21 NOTE — Progress Notes (Signed)
Subjective: The patient says that her left knee is less painful and less swollen. However it began to bleed when she walked to the bathroom. She has no outright pain with urination, however, she says that her urine is strong smelling and malodorous.  Objective: Vital signs in last 24 hours: Filed Vitals:   10/20/11 2203 10/21/11 0601 10/21/11 0758 10/21/11 1025  BP: 95/61 103/67  96/60  Pulse: 91 79 85 83  Temp: 98.1 F (36.7 C) 97.8 F (36.6 C)  97.6 F (36.4 C)  TempSrc: Oral Oral  Oral  Resp: 18 18  18   Height:      Weight:  70.353 kg (155 lb 1.6 oz)    SpO2: 98% 100% 98% 96%    Intake/Output Summary (Last 24 hours) at 10/21/11 1114 Last data filed at 10/20/11 1930  Gross per 24 hour  Intake   1752 ml  Output    400 ml  Net   1352 ml    Weight change: 0.953 kg (2 lb 1.6 oz)  Physical exam: Lungs: Clear to auscultation bilaterally. Heart: S1, S2, with no murmurs rubs or gallops. Abdomen: Positive bowel sounds, soft, nontender, nondistended. Extremities: Left knee is globally edematous and mildly to moderately tender. There is some bleeding with blood oozing from the week. No obvious purulent drainage. There is 1+ global left lower extremity edema and no edema on the right.  Lab Results: Basic Metabolic Panel:  Basename 10/20/11 0630 10/20/11 0040 10/19/11 2204  NA 137 -- 136  K 3.7 -- 3.6  CL 103 -- 101  CO2 28 -- 26  GLUCOSE 102* -- 131*  BUN 15 -- 17  CREATININE 0.81 -- 0.85  CALCIUM 8.9 -- 9.4  MG -- 2.2 --  PHOS -- -- --   Liver Function Tests:  Basename 10/20/11 0040  AST 15  ALT 12  ALKPHOS 84  BILITOT 0.3  PROT 7.6  ALBUMIN 3.5   No results found for this basename: LIPASE:2,AMYLASE:2 in the last 72 hours No results found for this basename: AMMONIA:2 in the last 72 hours CBC:  Basename 10/20/11 0630 10/19/11 2204  WBC 7.6 9.3  NEUTROABS -- 6.2  HGB 11.0* 11.8*  HCT 33.1* 35.3*  MCV 84.0 83.6  PLT 263 257   Cardiac Enzymes: No results  found for this basename: CKTOTAL:3,CKMB:3,CKMBINDEX:3,TROPONINI:3 in the last 72 hours BNP: No results found for this basename: PROBNP:3 in the last 72 hours D-Dimer:  Alvira Philips 10/19/11 2204  DDIMER 0.36   CBG: No results found for this basename: GLUCAP:6 in the last 72 hours Hemoglobin A1C:  Basename 10/20/11 0040  HGBA1C 5.9*   Fasting Lipid Panel: No results found for this basename: CHOL,HDL,LDLCALC,TRIG,CHOLHDL,LDLDIRECT in the last 72 hours Thyroid Function Tests: No results found for this basename: TSH,T4TOTAL,FREET4,T3FREE,THYROIDAB in the last 72 hours Anemia Panel: No results found for this basename: VITAMINB12,FOLATE,FERRITIN,TIBC,IRON,RETICCTPCT in the last 72 hours Coagulation: No results found for this basename: LABPROT:2,INR:2 in the last 72 hours Urine Drug Screen: Drugs of Abuse  No results found for this basename: labopia, cocainscrnur, labbenz, amphetmu, thcu, labbarb    Alcohol Level: No results found for this basename: ETH:2 in the last 72 hours Urinalysis:  Misc. Labs:   Micro: Recent Results (from the past 240 hour(s))  CULTURE, ROUTINE-ABSCESS     Status: Normal (Preliminary result)   Collection Time   10/19/11 10:38 PM      Component Value Range Status Comment   Specimen Description OTHER   Final  Special Requests NONE KNEE   Final    Gram Stain PENDING   Incomplete    Culture Culture reincubated for better growth   Final    Report Status PENDING   Incomplete     Studies/Results: Dg Knee Complete 4 Views Left  10/20/2011  *RADIOLOGY REPORT*  Clinical Data: Knee pain and swelling. Purulent discharge.  LEFT KNEE - COMPLETE 4+ VIEW  Comparison: None  Findings: Prepatellar soft tissue swelling is seen.  There is no definite evidence of knee joint effusion.  There is no evidence of fracture or dislocation.  Mild medial and lateral compartment osteoarthritis is seen.  Mild lateral compartment chondrocalcinosis also noted.  IMPRESSION:  1.   Prepatellar soft tissue swelling.  No evidence of knee joint effusion. 2.  Mild medial and lateral compartment osteoarthritis.  Original Report Authenticated By: Danae Orleans, M.D.    Medications: I have reviewed the patient's current medications.  Assessment: Active Problems:  Cellulitis of knee, left  HTN (hypertension), benign  Obesity  Osteoarthritis  Hyperglycemia  Anemia  UTI (lower urinary tract infection)  1. Cellulitis and abscess of the left knee, status post I&D by the emergency department physician. She is on vancomycin. Culture result pending.  Possible urinary tract infection. The patient does have a contaminated urinalysis but nitrite is positive. Given that her blood pressure is on the low normal side and she has a concomitant infection, we'll add another antibiotic for gram-negative coverage for the cellulitis and possible urinary tract infection.  History of hypertension with relative hypotension. She is treated with lisinopril and hydrochlorothiazide and amlodipine chronically. Next  Anemia. This is likely secondary to acute infections.  Hyperglycemia. Her hemoglobin A1c is 5.9. Her venous glucose is improving.  Plan:  1. Place parameters on the antihypertensive medications to hold them if her systolic blood pressures less than 110. Increase gentle IV fluids a little. Add Rocephin empirically and continue vancomycin. Order physical therapy consultation. Follow the results of the knee culture, currently pending.   LOS: 2 days   Angelica Frandsen 10/21/2011, 11:14 AM

## 2011-10-22 ENCOUNTER — Encounter (HOSPITAL_COMMUNITY): Payer: Self-pay | Admitting: Internal Medicine

## 2011-10-22 LAB — BASIC METABOLIC PANEL
BUN: 12 mg/dL (ref 6–23)
CO2: 25 mEq/L (ref 19–32)
Chloride: 105 mEq/L (ref 96–112)
Creatinine, Ser: 0.81 mg/dL (ref 0.50–1.10)
GFR calc Af Amer: >90 mL/min (ref >90)
Glucose, Bld: 88 mg/dL (ref 70–99)
Potassium: 4 mEq/L (ref 3.5–5.1)

## 2011-10-22 MED ORDER — LISINOPRIL-HYDROCHLOROTHIAZIDE 20-12.5 MG PO TABS: ORAL_TABLET | ORAL | Status: DC

## 2011-10-22 MED ORDER — OXYCODONE-ACETAMINOPHEN 5-325 MG PO TABS: 1.0000 | ORAL_TABLET | ORAL | Status: AC | PRN

## 2011-10-22 MED ORDER — AMLODIPINE BESYLATE 5 MG PO TABS: ORAL_TABLET | ORAL | Status: DC

## 2011-10-22 MED ORDER — DOXYCYCLINE HYCLATE 100 MG PO TABS: 100.0000 mg | ORAL_TABLET | Freq: Two times a day (BID) | ORAL | Status: AC

## 2011-10-22 NOTE — Progress Notes (Signed)
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 Per patient no d/c needs.

## 2011-10-22 NOTE — Evaluation (Signed)
Physical Therapy Evaluation Patient Details Name: Jennifer Rubio MRN: [id number] DOB: [birth year] Today's Date: 10/22/2011  Problem List:  Patient Active Problem List  Diagnoses  . Cellulitis of knee, left  . HTN (hypertension), benign  . Obesity  . Osteoarthritis  . Hyperglycemia  . Anemia  . UTI (lower urinary tract infection)    Past Medical History:  Past Medical History  Diagnosis Date  . Hypertension   . Osteoarthritis    Past Surgical History:  Past Surgical History  Procedure Date  . Abdominal hysterectomy   . Knee arthroscopy     both knees    PT Assessment/Plan/Recommendation PT Assessment Clinical Impression Statement: pt reports a significant improvement in overall discomfort/mobility of L knee...min edema visible, no erythema...strength and ROM are WNL...gait is antalgic with no assist device, so she was instructed in gait with a cane on level and steps...this normalizes gait pattern...she doesn't have a cane at home, but might not need one at time of d/c PT Recommendation/Assessment: Patent does not need any further PT services No Skilled PT: Patient at baseline level of functioning;All education completed PT Recommendation Follow Up Recommendations: No PT follow up Equipment Recommended: Gilmer Mor PT Goals     PT Evaluation Precautions/Restrictions  Precautions Required Braces or Orthoses: No Restrictions Weight Bearing Restrictions: No Prior Functioning  Home Living Lives With: Alone Receives Help From: Family Type of Home: House Home Layout: One level Home Access: Stairs to enter Entrance Stairs-Rails: Right Entrance Stairs-Number of Steps: 2 Home Adaptive Equipment: Crutches Prior Function Level of Independence: Independent with basic ADLs;Independent with homemaking with ambulation;Independent with gait;Independent with transfers Driving: Yes Vocation: Full time employment Cognition Cognition Arousal/Alertness: Awake/alert Overall Cognitive  Status: Appears within functional limits for tasks assessed Sensation/Coordination Sensation Light Touch: Appears Intact Stereognosis: Not tested Hot/Cold: Not tested Proprioception: Appears Intact Coordination Gross Motor Movements are Fluid and Coordinated: Yes Extremity Assessment RLE Assessment RLE Assessment: Within Functional Limits LLE Assessment LLE Assessment: Within Functional Limits Mobility (including Balance) Bed Mobility Bed Mobility: Yes Supine to Sit: 7: Independent Sit to Supine: 7: Independent Transfers Transfers: Yes Sit to Stand: 7: Independent Stand to Sit: 7: Independent Ambulation/Gait Ambulation/Gait: Yes Ambulation/Gait Assistance: 7: Independent Assistive device: Straight cane Gait Pattern: Within Functional Limits Stairs: Yes Stairs Assistance: 6: Modified independent (Device/Increase time) Stair Management Technique: One rail Right;Step to pattern;Forwards Number of Stairs: 2  Wheelchair Mobility Wheelchair Mobility: No  Posture/Postural Control Posture/Postural Control: No significant limitations Balance Balance Assessed: No Exercise    End of Session PT - End of Session Equipment Utilized During Treatment: Gait belt Activity Tolerance: Patient tolerated treatment well Patient left: in bed;with call bell in reach General Behavior During Session: [facility] for tasks performed Cognition: [facility] for tasks performed  Konrad Penta 10/22/2011, 8:39 AM

## 2011-10-22 NOTE — Progress Notes (Signed)
Pt discharged with instructions, prescriptions and carenotes.  She verbalized understanding.  She left the floor with staff and family in stable condition.

## 2011-10-22 NOTE — Progress Notes (Signed)
UR Chart Review Completed  

## 2011-10-22 NOTE — Progress Notes (Addendum)
ANTIBIOTIC CONSULT NOTE  Pharmacy Consult for Vancomycin  Indication:  Cellulitis / Abscess left knee  Allergies  Allergen Reactions  . Sulfa Antibiotics Anaphylaxis    Swelling     Patient Measurements: Height: 5\' 1"  (154.9 cm) Weight: 158 lb 4.8 oz (71.804 kg) IBW/kg (Calculated) : 47.8    Vital Signs: Temp: 97.7 F (36.5 C) (01/28 1006) Temp src: Oral (01/28 0509) BP: 103/70 mmHg (01/28 1045) Pulse Rate: 85  (01/28 1006) Intake/Output from previous day: 01/27 0701 - 01/28 0700 In: 3107.7 [P.O.:460; I.V.:2147.7; IV Piggyback:500] Out: -  Intake/Output from this shift:    Labs:  Basename 10/22/11 0416 10/20/11 0630 10/19/11 2204  WBC -- 7.6 9.3  HGB -- 11.0* 11.8*  PLT -- 263 257  LABCREA -- -- --  CREATININE 0.81 0.81 0.85   Estimated Creatinine Clearance: 67.8 ml/min (by C-G formula based on Cr of 0.81).  Basename 10/22/11 0936  VANCOTROUGH 10.3  VANCOPEAK --  VANCORANDOM --  GENTTROUGH --  GENTPEAK --  GENTRANDOM --  TOBRATROUGH --  TOBRAPEAK --  TOBRARND --  AMIKACINPEAK --  AMIKACINTROU --  AMIKACIN --     Microbiology: Recent Results (from the past 720 hour(s))  CULTURE, ROUTINE-ABSCESS     Status: Normal (Preliminary result)   Collection Time   10/19/11 10:38 PM      Component Value Range Status Comment   Specimen Description OTHER   Final    Special Requests NONE KNEE   Final    Gram Stain     Final    Value: FEW WBC PRESENT, PREDOMINANTLY PMN     NO SQUAMOUS EPITHELIAL CELLS SEEN     ABUNDANT GRAM POSITIVE COCCI IN CLUSTERS     IN PAIRS   Culture     Final    Value: ABUNDANT STAPHYLOCOCCUS AUREUS     Note: RIFAMPIN AND GENTAMICIN SHOULD NOT BE USED AS SINGLE DRUGS FOR TREATMENT OF STAPH INFECTIONS.   Report Status PENDING   Incomplete     Medical History: Past Medical History  Diagnosis Date  . Hypertension   . Osteoarthritis     Medications:  Scheduled:     . amLODipine  5 mg Oral Daily  . cefTRIAXone (ROCEPHIN)  IV  1  g Intravenous Q24H  . enoxaparin  40 mg Subcutaneous Daily  . lisinopril  40 mg Oral Daily  . sodium chloride      . sodium chloride      . vancomycin  750 mg Intravenous Q12H   Assessment:    Vancomycin trough within desired target.   Goal of Therapy:   Vancomycin trough level 10-15 mcg/ml  Plan:  Continue Vancomycin 750 mg IV every 12 hours. Labs per protocol.  Gilman Buttner, [place] J 10/22/2011,11:10 AM

## 2011-10-22 NOTE — Discharge Summary (Signed)
Physician Discharge Summary  Jennifer Rubio MRN: [id number] DOB/AGE: 60-Feb-1952 60 y.o.  PCP: Gabriel Cirri, FNP, [facility].   Admit date: 10/19/2011 Discharge date: 10/22/2011  Discharge Diagnoses:  1. Cellulitis and abscess of the left knee. Status post local I&D by the emergency department physician. Culture are was growing out Staphylococcus aureus prior to discharge. Sensitivities pending at the time of discharge. 2. Hyperglycemia, resolved. Hemoglobin A1c 5.9. 3. Urinary tract infection. 4. Mild normocytic anemia. Her hemoglobin was 11.0 prior to discharge. 5. Degenerative joint disease/osteoarthritis. 6. Hypertension with low normal blood pressures during the hospitalization.    Medication List  As of 10/22/2011  3:25 PM   TAKE these medications         amLODipine 5 MG tablet   Commonly known as: NORVASC   TAKE 1/2 OF A TABLET DAILY FOR 1 WEEK, THEN INCREASE IT TO 1 WHOLE TABLET DAILY.      doxycycline 100 MG tablet   Commonly known as: VIBRA-TABS   Take 1 tablet (100 mg total) by mouth 2 (two) times daily. ANTIBIOTIC. TAKE FOR 7 DAYS.      lisinopril-hydrochlorothiazide 20-12.5 MG per tablet   Commonly known as: PRINZIDE,ZESTORETIC   TAKE 1/2 OF A TABLET DAILY FOR 1 WEEK, THEN INCREASE IT TO 1 WHOLE TABLET DAILY.      oxyCODONE-acetaminophen 5-325 MG per tablet   Commonly known as: PERCOCET   Take 1 tablet by mouth every 4 (four) hours as needed for pain.            Discharge Condition: Improved and stable.  Disposition: Final discharge disposition not confirmed   Consults: None   Significant Diagnostic Studies: Dg Knee Complete 4 Views Left  10/20/2011  *RADIOLOGY REPORT*  Clinical Data: Knee pain and swelling. Purulent discharge.  LEFT KNEE - COMPLETE 4+ VIEW  Comparison: None  Findings: Prepatellar soft tissue swelling is seen.  There is no definite evidence of knee joint effusion.  There is no evidence of fracture or dislocation.  Mild  medial and lateral compartment osteoarthritis is seen.  Mild lateral compartment chondrocalcinosis also noted.  IMPRESSION:  1.  Prepatellar soft tissue swelling.  No evidence of knee joint effusion. 2.  Mild medial and lateral compartment osteoarthritis.  Original Report Authenticated By: Danae Orleans, M.D.     Microbiology: Recent Results (from the past 240 hour(s))  CULTURE, ROUTINE-ABSCESS     Status: Normal (Preliminary result)   Collection Time   10/19/11 10:38 PM      Component Value Range Status Comment   Specimen Description OTHER   Final    Special Requests NONE KNEE   Final    Gram Stain     Final    Value: FEW WBC PRESENT, PREDOMINANTLY PMN     NO SQUAMOUS EPITHELIAL CELLS SEEN     ABUNDANT GRAM POSITIVE COCCI IN CLUSTERS     IN PAIRS   Culture     Final    Value: ABUNDANT STAPHYLOCOCCUS AUREUS     Note: RIFAMPIN AND GENTAMICIN SHOULD NOT BE USED AS SINGLE DRUGS FOR TREATMENT OF STAPH INFECTIONS.   Report Status PENDING   Incomplete      Labs: Results for orders placed during the hospital encounter of 10/19/11 (from the past 48 hour(s))  BASIC METABOLIC PANEL     Status: Abnormal   Collection Time   10/22/11  4:16 AM      Component Value Range Comment   Sodium 135  135 -  145 (mEq/L)    Potassium 4.0  3.5 - 5.1 (mEq/L)    Chloride 105  96 - 112 (mEq/L)    CO2 25  19 - 32 (mEq/L)    Glucose, Bld 88  70 - 99 (mg/dL)    BUN 12  6 - 23 (mg/dL)    Creatinine, Ser 1.61  0.50 - 1.10 (mg/dL)    Calcium 8.5  8.4 - 10.5 (mg/dL)    GFR calc non Af Amer 78 (*) >90 (mL/min)    GFR calc Af Amer >90  >90 (mL/min)   VANCOMYCIN, TROUGH     Status: Normal   Collection Time   10/22/11  9:36 AM      Component Value Range Comment   Vancomycin Tr 10.3  10.0 - 20.0 (ug/mL)      HPI : The patient is a 61 year old woman with a past medical history significant for hypertension, who presented to the emergency department on 10/19/2011 with a chief complaint of left knee pain, swelling,  and redness. In the emergency department, she was noted to be afebrile, mildly tachycardic, and otherwise hemodynamically stable. The x-ray of her knee revealed pre-patellar soft swelling and osteoarthritis. The PA performed an incision and drainage. The drainage was purulent. A specimen was sent to the lab. The patient's white blood cell count was 9.3. She was admitted for further evaluation and management.  HOSPITAL COURSE: The patient was started on vancomycin empirically. Dressing changes were started and performed daily by the nursing staff. Analgesics was ordered as needed for pain. She was advised to keep her left leg elevated at rest. Her urinalysis revealed WBCs, many bacteria, and many squamous cells. It was also positive for nitrite. This was felt to be a contaminated urine, however, the patient later admitted that she had a malodorous urine. In the setting of an ongoing cellulitis of the left knee, Rocephin was added not only for gram-negative coverage of the cellulitis, but for a possible urinary tract infection. The patient's blood pressure was noted to be on the low normal side on chronic therapy with lisinopril and amlodipine. Hydrochlorothiazide was not ordered as the patient was started on IV fluid hydration. Parameters were ordered to hold the lisinopril and amlodipine for a systolic blood pressure of less than 110.  The patient was noted to have mild hyperglycemia on admission. Her hemoglobin A1c was 5.9, within normal limits. Over the course of the hospitalization, her hyperglycemia resolved.  The extent of swelling and discomfort of the left knee subsided. The wick which had been placed by the PA in the emergency department, was removed. The knee abscess culture grew out abundant Staphylococcus aureus, but the sensitivities were pending. At the time of hospital discharge, there was significantly less erythema and edema. There was no active purulent drainage. She remained afebrile and  hemodynamically stable. Her white blood cell count remained within normal limits. She was evaluated by the physical therapist who stated that she had no physical therapy needs. She received a total of 3 days of IV antibiotic therapy in the hospital. She was discharged to home on 7 more days of doxycycline.    Discharge Exam:  Blood pressure 103/70, pulse 85, temperature 97.7 F (36.5 C), temperature source Oral, resp. rate 16, height 5\' 1"  (1.549 m), weight 71.804 kg (158 lb 4.8 oz), SpO2 98.00%. Lungs: Clear to auscultation bilaterally. Heart: S1, S2, with a soft systolic murmur. Abdomen: Positive bowel sounds, soft, nontender, nondistended. Extremities: Significantly less edema erythema and tenderness  of the left knee. The wick was removed with no active purulent drainage. No pedal edema. Range of motion with flexion and extension was within normal limits, but with some mild discomfort.    Discharge Orders    Future Orders Please Complete By Expires   Diet - low sodium heart healthy      Increase activity slowly      Discharge wound care:      Comments:   CLEAN KNEE WITH SALINE OR ANTIBACTERIAL SOAP AND WATER GENTLY DAILY.  DAB DRY AND APPLY NON-STICK DRESSING DAILY.      Follow-up Information    Please follow up. (Follow up with your doctor in 1 week.)          Discharge time: 40 minutes.  Signed: Nakina Spatz 10/22/2011, 3:25 PM

## 2011-10-23 LAB — CULTURE, ROUTINE-ABSCESS

## 2011-11-12 ENCOUNTER — Encounter (HOSPITAL_COMMUNITY): Payer: Self-pay | Admitting: Pharmacist

## 2011-11-19 ENCOUNTER — Encounter (HOSPITAL_COMMUNITY)
Admission: RE | Admit: 2011-11-19 | Discharge: 2011-11-19 | Disposition: A | Discharge status: Home / Self Care | Payer: BC Managed Care – PPO | Source: Ambulatory Visit | Attending: Obstetrics & Gynecology | Admitting: Obstetrics & Gynecology

## 2011-11-19 ENCOUNTER — Encounter (HOSPITAL_COMMUNITY): Payer: Self-pay

## 2011-11-19 LAB — BASIC METABOLIC PANEL
CO2: 26 mEq/L (ref 19–32)
Calcium: 9.1 mg/dL (ref 8.4–10.5)
Creatinine, Ser: 0.75 mg/dL (ref 0.50–1.10)
GFR calc non Af Amer: >90 mL/min (ref >90)
Glucose, Bld: 88 mg/dL (ref 70–99)

## 2011-11-19 LAB — CBC
MCH: 26.9 pg (ref 26.0–34.0)
MCHC: 31.5 g/dL (ref 30.0–36.0)
MCV: 85.3 fL (ref 78.0–100.0)
Platelets: 254 10*3/uL (ref 150–400)
RBC: 4.35 MIL/uL (ref 3.87–5.11)

## 2011-11-19 LAB — SURGICAL PCR SCREEN: MRSA, PCR: NEGATIVE

## 2011-11-19 NOTE — Patient Instructions (Addendum)
[address]  11/19/2011   Your procedure is scheduled on:  11/26/11  Enter through the Main Entrance of [facility] at 6 AM.  Pick up the phone at the desk and dial [phone].   Call this number if you have problems the morning of surgery: [phone]   Remember:   Do not eat food:After Midnight.  Do not drink clear liquids: After Midnight.  Take these medicines the morning of surgery with A SIP OF WATER: Both Blood Pressure medications   Do not wear jewelry, make-up or nail polish.  Do not wear lotions, powders, or perfumes. You may wear deodorant.  Do not shave 48 hours prior to surgery.  Do not bring valuables to the hospital.  Contacts, dentures or bridgework may not be worn into surgery.  Leave suitcase in the car. After surgery it may be brought to your room.  For patients admitted to the hospital, checkout time is 11:00 AM the day of discharge.   Patients discharged the day of surgery will not be allowed to drive home.  Name and phone number of your driver: NA  Special Instructions: CHG Shower Use Special Wash: 1/2 bottle night before surgery and 1/2 bottle morning of surgery.   Please read over the following fact sheets that you were given: MRSA Information

## 2011-11-19 NOTE — Pre-Procedure Instructions (Signed)
Recent EKG accepted by Dr.Hatchett, report of Stress test requested from Cardiologist; Dr. Juliann Pares, [place],  Patton Salles RN

## 2011-11-20 ENCOUNTER — Other Ambulatory Visit: Payer: Self-pay | Admitting: Obstetrics & Gynecology

## 2011-11-26 ENCOUNTER — Encounter (HOSPITAL_COMMUNITY): Payer: Self-pay

## 2011-11-26 ENCOUNTER — Ambulatory Visit (HOSPITAL_COMMUNITY): Payer: BC Managed Care – PPO | Admitting: Anesthesiology

## 2011-11-26 ENCOUNTER — Encounter (HOSPITAL_COMMUNITY)
Admission: RE | Disposition: A | Discharge status: Home / Self Care | Payer: Self-pay | Source: Ambulatory Visit | Attending: Obstetrics & Gynecology

## 2011-11-26 ENCOUNTER — Ambulatory Visit (HOSPITAL_COMMUNITY)
Admission: RE | Admit: 2011-11-26 | Discharge: 2011-11-27 | Disposition: A | Discharge status: Home / Self Care | Payer: BC Managed Care – PPO | Source: Ambulatory Visit | Attending: Obstetrics & Gynecology | Admitting: Obstetrics & Gynecology

## 2011-11-26 ENCOUNTER — Encounter (HOSPITAL_COMMUNITY): Payer: Self-pay | Admitting: Anesthesiology

## 2011-11-26 ENCOUNTER — Other Ambulatory Visit: Payer: Self-pay

## 2011-11-26 DIAGNOSIS — N736 Female pelvic peritoneal adhesions (postinfective): Secondary | ICD-10-CM | POA: Insufficient documentation

## 2011-11-26 DIAGNOSIS — Z01818 Encounter for other preprocedural examination: Secondary | ICD-10-CM | POA: Insufficient documentation

## 2011-11-26 DIAGNOSIS — N993 Prolapse of vaginal vault after hysterectomy: Secondary | ICD-10-CM | POA: Insufficient documentation

## 2011-11-26 DIAGNOSIS — Z01812 Encounter for preprocedural laboratory examination: Secondary | ICD-10-CM | POA: Insufficient documentation

## 2011-11-26 DIAGNOSIS — N393 Stress incontinence (female) (male): Secondary | ICD-10-CM | POA: Insufficient documentation

## 2011-11-26 DIAGNOSIS — I10 Essential (primary) hypertension: Secondary | ICD-10-CM | POA: Insufficient documentation

## 2011-11-26 HISTORY — PX: RECTOCELE REPAIR: SHX761

## 2011-11-26 HISTORY — PX: ROBOTIC ASSISTED LAPAROSCOPIC SACROCOLPOPEXY: SHX5388

## 2011-11-26 HISTORY — PX: BLADDER SUSPENSION: SHX72

## 2011-11-26 HISTORY — PX: CYSTOSCOPY: SHX5120

## 2011-11-26 LAB — TYPE AND SCREEN: Antibody Screen: NEGATIVE

## 2011-11-26 LAB — ABO/RH: ABO/RH(D): B POS

## 2011-11-26 SURGERY — ROBOTIC ASSISTED LAPAROSCOPIC SACROCOLPOPEXY
Anesthesia: General | Site: Vagina | Wound class: Clean Contaminated

## 2011-11-26 MED ORDER — BUPIVACAINE-EPINEPHRINE (PF) 0.5% -1:200000 IJ SOLN
INTRAMUSCULAR | Status: AC
  Filled 2011-11-26: qty 10

## 2011-11-26 MED ORDER — NEOSTIGMINE METHYLSULFATE 1 MG/ML IJ SOLN
INTRAMUSCULAR | Status: DC | PRN
  Administered 2011-11-26: 2 mg via INTRAVENOUS

## 2011-11-26 MED ORDER — SODIUM CHLORIDE 0.9 % IJ SOLN
INTRAMUSCULAR | Status: DC | PRN
  Administered 2011-11-26: 60 mL via INTRAVENOUS

## 2011-11-26 MED ORDER — FENTANYL CITRATE 0.05 MG/ML IJ SOLN: 25.0000 ug | INTRAMUSCULAR | Status: DC | PRN

## 2011-11-26 MED ORDER — ONDANSETRON HCL 4 MG/2ML IJ SOLN
INTRAMUSCULAR | Status: DC | PRN
  Administered 2011-11-26: 4 mg via INTRAVENOUS

## 2011-11-26 MED ORDER — GLYCOPYRROLATE 0.2 MG/ML IJ SOLN
INTRAMUSCULAR | Status: DC | PRN
  Administered 2011-11-26: 0.4 mg via INTRAVENOUS

## 2011-11-26 MED ORDER — LACTATED RINGERS IR SOLN
Status: DC | PRN
  Administered 2011-11-26: 3000 mL

## 2011-11-26 MED ORDER — HYDROMORPHONE HCL PF 1 MG/ML IJ SOLN
1.0000 mg | INTRAMUSCULAR | Status: DC | PRN
  Administered 2011-11-26 – 2011-11-27 (×3): 1 mg via INTRAVENOUS
  Filled 2011-11-26 (×3): qty 1

## 2011-11-26 MED ORDER — HETASTARCH-ELECTROLYTES 6 % IV SOLN
INTRAVENOUS | Status: DC | PRN
  Administered 2011-11-26: 11:00:00 via INTRAVENOUS

## 2011-11-26 MED ORDER — ESTRADIOL 0.1 MG/GM VA CREA
TOPICAL_CREAM | VAGINAL | Status: AC
  Filled 2011-11-26: qty 42.5

## 2011-11-26 MED ORDER — MEPERIDINE HCL 25 MG/ML IJ SOLN
6.2500 mg | INTRAMUSCULAR | Status: DC | PRN
  Administered 2011-11-26: 12.5 mg via INTRAVENOUS

## 2011-11-26 MED ORDER — GLYCOPYRROLATE 0.2 MG/ML IJ SOLN
INTRAMUSCULAR | Status: AC
  Filled 2011-11-26: qty 2

## 2011-11-26 MED ORDER — FENTANYL CITRATE 0.05 MG/ML IJ SOLN
INTRAMUSCULAR | Status: AC
  Filled 2011-11-26: qty 2

## 2011-11-26 MED ORDER — ROCURONIUM BROMIDE 50 MG/5ML IV SOLN
INTRAVENOUS | Status: AC
  Filled 2011-11-26: qty 1

## 2011-11-26 MED ORDER — INDIGOTINDISULFONATE SODIUM 8 MG/ML IJ SOLN
INTRAMUSCULAR | Status: AC
  Filled 2011-11-26: qty 10

## 2011-11-26 MED ORDER — LIDOCAINE HCL (CARDIAC) 20 MG/ML IV SOLN
INTRAVENOUS | Status: AC
  Filled 2011-11-26: qty 5

## 2011-11-26 MED ORDER — BUPIVACAINE HCL (PF) 0.25 % IJ SOLN
INTRAMUSCULAR | Status: DC | PRN
  Administered 2011-11-26: 15 mL

## 2011-11-26 MED ORDER — HETASTARCH-ELECTROLYTES 6 % IV SOLN
INTRAVENOUS | Status: AC
  Filled 2011-11-26: qty 500

## 2011-11-26 MED ORDER — HYDROMORPHONE HCL PF 1 MG/ML IJ SOLN
1.0000 mg | INTRAMUSCULAR | Status: DC | PRN
  Administered 2011-11-26: 1 mg via INTRAMUSCULAR
  Filled 2011-11-26: qty 1

## 2011-11-26 MED ORDER — MIDAZOLAM HCL 2 MG/2ML IJ SOLN
INTRAMUSCULAR | Status: AC
  Filled 2011-11-26: qty 2

## 2011-11-26 MED ORDER — HYDROCHLOROTHIAZIDE 12.5 MG PO CAPS
12.5000 mg | ORAL_CAPSULE | Freq: Once | ORAL | Status: AC
  Administered 2011-11-26: 12.5 mg via ORAL
  Filled 2011-11-26: qty 1

## 2011-11-26 MED ORDER — OXYCODONE-ACETAMINOPHEN 5-325 MG PO TABS
1.0000 | ORAL_TABLET | ORAL | Status: DC | PRN
  Administered 2011-11-27 (×2): 1 via ORAL
  Filled 2011-11-26: qty 1
  Filled 2011-11-26: qty 2

## 2011-11-26 MED ORDER — LACTATED RINGERS IV SOLN
INTRAVENOUS | Status: DC
  Administered 2011-11-26 (×2): via INTRAVENOUS
  Administered 2011-11-26: 50 mL/h via INTRAVENOUS

## 2011-11-26 MED ORDER — NEOSTIGMINE METHYLSULFATE 1 MG/ML IJ SOLN
INTRAMUSCULAR | Status: AC
  Filled 2011-11-26: qty 10

## 2011-11-26 MED ORDER — MEPERIDINE HCL 25 MG/ML IJ SOLN
INTRAMUSCULAR | Status: AC
  Administered 2011-11-26: 12.5 mg via INTRAVENOUS
  Filled 2011-11-26: qty 1

## 2011-11-26 MED ORDER — SCOPOLAMINE 1 MG/3DAYS TD PT72
MEDICATED_PATCH | TRANSDERMAL | Status: AC
  Administered 2011-11-26: 1.5 mg via TRANSDERMAL
  Filled 2011-11-26: qty 1

## 2011-11-26 MED ORDER — CEFAZOLIN SODIUM 1-5 GM-% IV SOLN: 1.0000 g | Freq: Once | INTRAVENOUS | Status: DC

## 2011-11-26 MED ORDER — FENTANYL CITRATE 0.05 MG/ML IJ SOLN
INTRAMUSCULAR | Status: DC | PRN
  Administered 2011-11-26: 100 ug via INTRAVENOUS

## 2011-11-26 MED ORDER — BACITRACIN ZINC 500 UNIT/GM EX OINT
TOPICAL_OINTMENT | CUTANEOUS | Status: AC
  Filled 2011-11-26: qty 15

## 2011-11-26 MED ORDER — BUPIVACAINE-EPINEPHRINE 0.5% -1:200000 IJ SOLN
INTRAMUSCULAR | Status: DC | PRN
  Administered 2011-11-26: 30 mL

## 2011-11-26 MED ORDER — STERILE WATER FOR IRRIGATION IR SOLN
Status: DC | PRN
  Administered 2011-11-26: 1000 mL via INTRAVESICAL

## 2011-11-26 MED ORDER — IBUPROFEN 600 MG PO TABS
600.0000 mg | ORAL_TABLET | Freq: Four times a day (QID) | ORAL | Status: DC | PRN
  Administered 2011-11-27: 600 mg via ORAL
  Filled 2011-11-26: qty 1

## 2011-11-26 MED ORDER — SUFENTANIL CITRATE 50 MCG/ML IV SOLN
INTRAVENOUS | Status: DC | PRN
  Administered 2011-11-26: 20 ug via INTRAVENOUS
  Administered 2011-11-26: 5 ug via INTRAVENOUS
  Administered 2011-11-26: 10 ug via INTRAVENOUS
  Administered 2011-11-26: 5 ug via INTRAVENOUS
  Administered 2011-11-26: 10 ug via INTRAVENOUS

## 2011-11-26 MED ORDER — PROPOFOL 10 MG/ML IV EMUL
INTRAVENOUS | Status: DC | PRN
  Administered 2011-11-26: 120 mg via INTRAVENOUS
  Administered 2011-11-26: 40 mg via INTRAVENOUS

## 2011-11-26 MED ORDER — ONDANSETRON HCL 4 MG/2ML IJ SOLN
INTRAMUSCULAR | Status: AC
  Filled 2011-11-26: qty 2

## 2011-11-26 MED ORDER — ROCURONIUM BROMIDE 100 MG/10ML IV SOLN
INTRAVENOUS | Status: DC | PRN
  Administered 2011-11-26: 10 mg via INTRAVENOUS
  Administered 2011-11-26: 50 mg via INTRAVENOUS
  Administered 2011-11-26 (×3): 10 mg via INTRAVENOUS

## 2011-11-26 MED ORDER — AMLODIPINE BESYLATE 5 MG PO TABS
5.0000 mg | ORAL_TABLET | Freq: Once | ORAL | Status: AC
  Administered 2011-11-26: 5 mg via ORAL
  Filled 2011-11-26: qty 1

## 2011-11-26 MED ORDER — LACTATED RINGERS IV SOLN
INTRAVENOUS | Status: DC
  Administered 2011-11-26 – 2011-11-27 (×2): via INTRAVENOUS

## 2011-11-26 MED ORDER — DEXAMETHASONE SODIUM PHOSPHATE 10 MG/ML IJ SOLN
INTRAMUSCULAR | Status: AC
  Filled 2011-11-26: qty 1

## 2011-11-26 MED ORDER — LISINOPRIL 20 MG PO TABS
20.0000 mg | ORAL_TABLET | Freq: Once | ORAL | Status: AC
  Administered 2011-11-26: 20 mg via ORAL
  Filled 2011-11-26: qty 1

## 2011-11-26 MED ORDER — CEFAZOLIN SODIUM 1-5 GM-% IV SOLN
INTRAVENOUS | Status: AC
  Administered 2011-11-26: 1 g via INTRAVENOUS
  Filled 2011-11-26: qty 50

## 2011-11-26 MED ORDER — ESTRADIOL 0.1 MG/GM VA CREA
TOPICAL_CREAM | VAGINAL | Status: DC | PRN
  Administered 2011-11-26: 1 via VAGINAL

## 2011-11-26 MED ORDER — BUPIVACAINE HCL (PF) 0.25 % IJ SOLN
INTRAMUSCULAR | Status: AC
  Filled 2011-11-26: qty 30

## 2011-11-26 MED ORDER — PROPOFOL 10 MG/ML IV EMUL
INTRAVENOUS | Status: AC
  Filled 2011-11-26: qty 40

## 2011-11-26 MED ORDER — DEXAMETHASONE SODIUM PHOSPHATE 10 MG/ML IJ SOLN
INTRAMUSCULAR | Status: DC | PRN
  Administered 2011-11-26: 10 mg via INTRAVENOUS

## 2011-11-26 MED ORDER — ARTIFICIAL TEARS OP OINT
TOPICAL_OINTMENT | OPHTHALMIC | Status: DC | PRN
  Administered 2011-11-26: 1 via OPHTHALMIC

## 2011-11-26 MED ORDER — LIDOCAINE HCL (CARDIAC) 20 MG/ML IV SOLN
INTRAVENOUS | Status: DC | PRN
  Administered 2011-11-26: 40 mg via INTRAVENOUS

## 2011-11-26 MED ORDER — MIDAZOLAM HCL 5 MG/5ML IJ SOLN
INTRAMUSCULAR | Status: DC | PRN
  Administered 2011-11-26: 2 mg via INTRAVENOUS

## 2011-11-26 MED ORDER — SUFENTANIL CITRATE 50 MCG/ML IV SOLN
INTRAVENOUS | Status: AC
  Filled 2011-11-26: qty 1

## 2011-11-26 MED ORDER — METOCLOPRAMIDE HCL 5 MG/ML IJ SOLN: 10.0000 mg | Freq: Once | INTRAMUSCULAR | Status: DC | PRN

## 2011-11-26 MED ORDER — SCOPOLAMINE 1 MG/3DAYS TD PT72
1.0000 | MEDICATED_PATCH | Freq: Once | TRANSDERMAL | Status: DC
  Administered 2011-11-26: 1.5 mg via TRANSDERMAL

## 2011-11-26 SURGICAL SUPPLY — 85 items
ARTY (Female Continence) ×5 IMPLANT
BARRIER ADHS 3X4 INTERCEED (GAUZE/BANDAGES/DRESSINGS) ×5 IMPLANT
BLADE SURG 15 STRL LF C SS BP (BLADE) ×4 IMPLANT
BLADE SURG 15 STRL SS (BLADE) ×1
CABLE HIGH FREQUENCY MONO STRZ (ELECTRODE) ×5 IMPLANT
CANISTER SUCTION 2500CC (MISCELLANEOUS) ×5 IMPLANT
CATH FOLEY 2WAY SLVR 18FR 30CC (CATHETERS) ×5 IMPLANT
CATH FOLEY 3WAY 30CC 18FR (CATHETERS) IMPLANT
CLOTH BEACON ORANGE TIMEOUT ST (SAFETY) ×5 IMPLANT
CONT PATH 16OZ SNAP LID 3702 (MISCELLANEOUS) IMPLANT
COVER TIP SHEARS 8 DVNC (MISCELLANEOUS) ×4 IMPLANT
COVER TIP SHEARS 8MM DA VINCI (MISCELLANEOUS) ×1
DECANTER SPIKE VIAL GLASS SM (MISCELLANEOUS) ×5 IMPLANT
DERMABOND ADVANCED (GAUZE/BANDAGES/DRESSINGS) ×1
DERMABOND ADVANCED .7 DNX12 (GAUZE/BANDAGES/DRESSINGS) ×4 IMPLANT
DEVICE CAPIO SUTURING (INSTRUMENTS)
DEVICE CAPIO SUTURING OPC (INSTRUMENTS) IMPLANT
DRAPE CAMERA CLOSED 9X96 (DRAPES) ×5 IMPLANT
DRAPE HUG U DISPOSABLE (DRAPE) ×5 IMPLANT
DRAPE HYSTEROSCOPY (DRAPE) ×5 IMPLANT
DRAPE MONITOR DA VINCI (DRAPE) IMPLANT
DRAPE WARM FLUID 44X44 (DRAPE) ×5 IMPLANT
EVACUATOR SMOKE 8.L (FILTER) ×5 IMPLANT
GAUZE PACKING 1 X5 YD ST (GAUZE/BANDAGES/DRESSINGS) ×5 IMPLANT
GAUZE PACKING IODOFORM 2 (PACKING) IMPLANT
GLOVE BIO SURGEON STRL SZ 6.5 (GLOVE) ×40 IMPLANT
GLOVE BIOGEL PI IND STRL 6.5 (GLOVE) ×12 IMPLANT
GLOVE BIOGEL PI IND STRL 7.0 (GLOVE) ×28 IMPLANT
GLOVE BIOGEL PI INDICATOR 6.5 (GLOVE) ×3
GLOVE BIOGEL PI INDICATOR 7.0 (GLOVE) ×7
GLOVE ECLIPSE 6.0 STRL STRAW (GLOVE) ×5 IMPLANT
GLOVE SURG SS PI 6.5 STRL IVOR (GLOVE) ×15 IMPLANT
GOWN PREVENTION PLUS LG XLONG (DISPOSABLE) ×30 IMPLANT
GOWN STRL REIN XL XLG (GOWN DISPOSABLE) ×40 IMPLANT
KIT ACCESSORY DA VINCI DISP (KITS) ×1
KIT ACCESSORY DVNC DISP (KITS) ×4 IMPLANT
KIT DISP ACCESSORY 4 ARM (KITS) IMPLANT
NEEDLE HYPO 22GX1.5 SAFETY (NEEDLE) ×10 IMPLANT
NEEDLE SPNL 20GX3.5 QUINCKE YW (NEEDLE) ×5 IMPLANT
NEEDLE SPNL 22GX3.5 QUINCKE BK (NEEDLE) IMPLANT
NS IRRIG 1000ML POUR BTL (IV SOLUTION) ×5 IMPLANT
OCCLUDER COLPOPNEUMO (BALLOONS) IMPLANT
PACK LAVH (CUSTOM PROCEDURE TRAY) ×5 IMPLANT
PACK VAGINAL WOMENS (CUSTOM PROCEDURE TRAY) ×5 IMPLANT
PAD PREP 24X48 CUFFED NSTRL (MISCELLANEOUS) ×5 IMPLANT
PLUG CATH AND CAP STER (CATHETERS) ×5 IMPLANT
POSITIONER SURGICAL ARM (MISCELLANEOUS) ×5 IMPLANT
SET CYSTO W/LG BORE CLAMP LF (SET/KITS/TRAYS/PACK) ×10 IMPLANT
SLING TVT EXACT (Sling) ×5 IMPLANT
SPONGE LAP 18X18 X RAY DECT (DISPOSABLE) ×5 IMPLANT
SPONGE LAP 4X18 X RAY DECT (DISPOSABLE) ×5 IMPLANT
SUT CAPIO ETHIBPND (SUTURE) IMPLANT
SUT GORETEX NAB #0 THX26 36IN (SUTURE) ×15 IMPLANT
SUT VIC AB 0 CT1 27 (SUTURE)
SUT VIC AB 0 CT1 27XBRD ANBCTR (SUTURE) IMPLANT
SUT VIC AB 2-0 CT1 27 (SUTURE) ×2
SUT VIC AB 2-0 CT1 TAPERPNT 27 (SUTURE) ×8 IMPLANT
SUT VIC AB 2-0 CT2 27 (SUTURE) IMPLANT
SUT VIC AB 2-0 SH 27 (SUTURE) ×2
SUT VIC AB 2-0 SH 27XBRD (SUTURE) ×8 IMPLANT
SUT VIC AB 2-0 UR5 27 (SUTURE) IMPLANT
SUT VIC AB 3-0 SH 27 (SUTURE) ×2
SUT VIC AB 3-0 SH 27X BRD (SUTURE) ×8 IMPLANT
SUT VIC AB 4-0 PS2 27 (SUTURE) ×10 IMPLANT
SUT VICRYL 0 UR6 27IN ABS (SUTURE) ×10 IMPLANT
SUT VICRYL 2 0 18  UND BR (SUTURE)
SUT VICRYL 2 0 18 UND BR (SUTURE) IMPLANT
SYR BULB IRRIGATION 50ML (SYRINGE) ×5 IMPLANT
SYRINGE 10CC LL (SYRINGE) IMPLANT
SYSTEM CONVERTIBLE TROCAR (TROCAR) ×5 IMPLANT
TIP UTERINE 5.1X6CM LAV DISP (MISCELLANEOUS) IMPLANT
TIP UTERINE 6.7X10CM GRN DISP (MISCELLANEOUS) IMPLANT
TIP UTERINE 6.7X6CM WHT DISP (MISCELLANEOUS) IMPLANT
TIP UTERINE 6.7X8CM BLUE DISP (MISCELLANEOUS) IMPLANT
TOWEL OR 17X24 6PK STRL BLUE (TOWEL DISPOSABLE) ×15 IMPLANT
TRAY FOLEY BAG SILVER LF 16FR (CATHETERS) ×5 IMPLANT
TRAY FOLEY CATH 14FR (SET/KITS/TRAYS/PACK) IMPLANT
TROCAR 12M 150ML BLUNT (TROCAR) ×5 IMPLANT
TROCAR DISP BLADELESS 8 DVNC (TROCAR) ×4 IMPLANT
TROCAR DISP BLADELESS 8MM (TROCAR) ×1
TROCAR XCEL 12X100 BLDLESS (ENDOMECHANICALS) IMPLANT
TROCAR XCEL NON-BLD 5MMX100MML (ENDOMECHANICALS) ×5 IMPLANT
TUBING FILTER THERMOFLATOR (ELECTROSURGICAL) ×5 IMPLANT
WARMER LAPAROSCOPE (MISCELLANEOUS) ×5 IMPLANT
WATER STERILE IRR 1000ML POUR (IV SOLUTION) ×15 IMPLANT

## 2011-11-26 NOTE — Anesthesia Postprocedure Evaluation (Signed)
Anesthesia Post Note  Patient: Jennifer Rubio  Procedure(s) Performed: Procedure(s) (LRB): ROBOTIC ASSISTED LAPAROSCOPIC SACROCOLPOPEXY (N/A) TRANSVAGINAL TAPE (TVT) PROCEDURE (N/A) CYSTOSCOPY (N/A) POSTERIOR REPAIR (RECTOCELE) (N/A) ROBOTIC ASSISTED LAPAROSCOPIC LYSIS OF ADHESION (N/A)  Anesthesia type: GA  Patient location: PACU  Post pain: Pain level controlled  Post assessment: Post-op Vital signs reviewed  Last Vitals:  Filed Vitals:   11/26/11 1415  BP: 107/72  Pulse: 81  Temp:   Resp:     Post vital signs: Reviewed  Level of consciousness: sedated  Complications: No apparent anesthesia complications

## 2011-11-26 NOTE — Op Note (Signed)
Met with pt in pre-op area to discuss indications for TVT sling and risks/ benefits of the procedure. We discussed 25% risk of urinary retention and subsequent need to go home w/ foley. We also discussed longer term retention needing return to OR, sling erosion, pain, bleeding, infection and damage to surrounding organs. Pt agrees to prophylactic sling with the robotic assisted sacrocolpopexy.  Preoperative diagnosis: Stress urinary incontinence, urethral hypermobility  Postoperative diagnosis: stress urinary incontinence, urethral hypermobility Procedure tension-free vaginal tape sling placement, retropubic placement, cystoscopy Surgeon: Ernestina Penna Assistant none Anesthesia Gen. endotracheal anesthesia Antibiotics 1 g Ancef Complications: None Estimated blood loss: 10 cc Findings: Hypermobile urethra,  atrophic vaginal mucosa, normal bladder and urethra, no sling material seen in bladder or urethra post procedure  Indications: cystocele, rectocele, apical prolapse being treated by sacrocolpopexy. 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 Patient has been counseled on the risk and benefits of this procedure including the risk of urinary retention (and possibly needing to go home with a catheter), risk of sling erosion, immediate surgical and anaesthesia risks,  and possibly worsening or causing de-novo urge incontinence. Patient consented to TVT sling.  Procedure: After informed consent was obtained from the patient she was taken to the operating room where general anesthesia was initiated without difficulty. Dr. Seymour Bars performed a DaVinci assisted LOA and sacrocolpopexy.  At the conclusion of this, the sling procedure was started.   The mons pubis was trimmed with scissore, the pubic symphysis was marked for the planned bilateral exit points, 2 cm lateral to the midline just above the a pubic symphysis.  A solution of  30 cc of 1% lidocaine with 1:100,000 units of epinephrine mixed with 60 cc of normal saline was used as the injection. 30 cc of this solution was injected into the suprapubic space bilaterally, an additional 10 cc of this solution was injected along the planned exit routes bilaterally. This placement was accomplished by placing a 20-gauge spinal needle from the planned abdominal exit points behind the pubic symphysis and into the retropubic space. This space was confirmed with the use of the hand in the vagina to feel the needle tip prior to aspirating and injecting. A Foley catheter was then placed to drain the bladder the balloon was inflated and used to help identify the bladder and to plan the sling placement in the mid urethral position. An Allis clamp was placed 2 cm below the urethral meatus; a second Allis clamp was placed 2 cm below that. 10 cc of the injecting solution were placed between the 2 Allis clamps with some lateral spread. A #15 blade was used to incise between the Allis clamps and Strulli scissors were used to dissect the vaginal mucosa laterally. Allis clamps were placed on the vaginal edges to help accomplish this dissection. The dissection was taken to just below the level of the pubic symphysis. Once the planned entry routes were dissected a rigid Foley was placed into the bladder. The rigid Foley was deviated towards the patient's right knee. With the anesthesiologist pointing out the patient's right shoulder so that I could aim to the midclavicular line, the trocar with trocar sleeve and sling attached was inserted into the pre-dissected space and was pushed under the pubic symphysis. With dropping the trocar handle towards the ground and sharply rotating the trocar to the abdominal wall I was able to guide the trocar out the abdominal wall at the planned exit point. A Elanah Osmanovic clamp was  placed on the trocar sleeve the trocar was removed through the vagina. The sling tape was assessed and  straightened and the trocar was placed in the opposite trocar sleeve. Again with retracting the dissected vaginal mucosa and placing the trocar in the planned left sided entry route, along with deviating the rigid Foley to the patient's left knee and aiming towards the left midclavicular line the trocar was passed under the pubic symphysis sharply rotated out of the abdominal wall and out the planned exit route. At this point cystoscopy was carried out. The bladder was filled to 300 cc and evaluation was done of the entire bladder mucosa and the urethra to ensure no sling material was eroding through the mucosa.  Using an Allis clamp to protect a small knuckle of sling material in the midpoint of the sling just under the urethra, the sling was pulled out through the abdominal entry points. Proper tensioning was assured with the use of the Allis clamp and once I was assured a tension-free placement Chistian Kasler clamps were placed on the plastic sheaths at the abdominal exit points and the abdominal sheaths were removed. At this point a Tanja Port was released and a tension-free placement was seen. Good hemostasis was noted, a repeat cystoscopy was done to confirm no sling material and the bladder or urethra. The vaginal mucosa was closed with 2-0 Vicryl on a CT-1. The patient tolerated the procedure well.  Dr. Seymour Bars then took over to complete a rectocele repair.    Laureano Hetzer A. 11/26/2011 10:57 PM

## 2011-11-26 NOTE — Transfer of Care (Signed)
Immediate Anesthesia Transfer of Care Note  Patient: Jennifer Rubio  Procedure(s) Performed: Procedure(s) (LRB): ROBOTIC ASSISTED LAPAROSCOPIC SACROCOLPOPEXY (N/A) TRANSVAGINAL TAPE (TVT) PROCEDURE (N/A) CYSTOSCOPY (N/A) POSTERIOR REPAIR (RECTOCELE) (N/A)  Patient Location: PACU  Anesthesia Type: General  Level of Consciousness: sedated  Airway & Oxygen Therapy: Patient Spontanous Breathing and Patient connected to nasal cannula oxygen  Post-op Assessment: Report given to PACU RN and Post -op Vital signs reviewed and stable  Post vital signs: stable  Complications: No apparent anesthesia complications

## 2011-11-26 NOTE — Op Note (Signed)
11/26/2011  1:23 PM  PATIENT:  Jennifer Rubio  61 y.o. female  PRE-OPERATIVE DIAGNOSIS:  Colpocele, Cystocele, Rectocele  POST-OPERATIVE DIAGNOSIS:  Colpocele, Cystocele, Rectocele  PROCEDURE:  Procedure(s): ROBOTIC ASSISTED LAPAROSCOPIC SACROCOLPOPEXY TRANSVAGINAL TAPE (TVT) PROCEDURE CYSTOSCOPY POSTERIOR REPAIR (RECTOCELE)  SURGEON:  Surgeon(s): Genia Del, MD Alphonsus Sias. Ernestina Penna, MD  ASSISTANTS: Dr Noland Fordyce   ANESTHESIA:   general  PROCEDURE:  Under general anesthesia with endotracheal intubation the patient is in lithotomy position. She is prepped with ChloraPrep on the abdomen and Betadine on the suprapubic vulvar and vaginal areas. She is draped as usual. She is status post TAH.  A Foley is inserted and a bladder.  Abdominally, we infiltrate the subcutaneous tissue with Marcaine one quarter plain at the supraumbilical area. We make a 1.2 cm incision for with the scalpel. We opened the aponeurosis under direct vision with Mayo scissors. We opened the perirectal peritoneum bluntly with a finger. A pursestring stitch of Vicryl 0 is applied on the aponeurosis.  We insert the [place] at that level and created pneumoperitoneum with CO2. The abdomino pelvic wall is free of adhesion. We used a semicircular configuration for the ports placement. 2 robotic ports on the left one robotic port on the upper right and the assistant port on the lower right.  All sites are marked with the pen, infiltrated with Marcaine one quarter plain and an incision is made with a scalpel.  We insert all ports under direct.  We undocked the robot on the left side. The Endo Shears scissor on the right the PK on the left and the Prograsp on the third arm.  A large rectal probe was used in the vagina.  Severe widespread adhesions were present between the bowel and the vaginal vault and the right pelvic and abdominal walls.  So we proceeded with extensive lysis of adhesion.  Once completed, the vaginal vault was  now accessible to dissect the bladder down anteriorly. The bladder was filled up with saline first to define its exact location.  We then dissected the bladder down to about 6 cm from the apex. Posteriorly we were able to dissect the visceral peritoneum side down lower than of the uterosacral ligaments about 4 cm from the apex. We therefore cut the Y of the mesh at 6 cm anteriorly and 4 cm posteriorly.  The Alysin Y mesh was introduced through the third port as well as the Gore-Tex sutures cut at 12 inches.  The instruments were switched to a cutting needle driver and the right hand and the regular needle driver in the left. The mesh was first secured to the anterior vaginal wall with  6 separate stitches of Gore-Tex well distributed. We then secured the posterior aspect of the Y mesh to the posterior side of the vagina with 5 separate stitches of Gore-Tex well distributed again.  We then switched the instruments again to the Endo Shears scissor and the right arm and the PK in the left arm.  We have to release more adhesions between the bowel and the right abdomino-pelvic wall in order to access the promontory of the sacrum.  The peritoneum was opened at that level and the presacral fat was removed until we were able to see the anteriorly ligament of the sacrum.  We then opened the peritoneum and created a channel from that point down to the pelvis. We then measured the length of the tail of the Y mesh needed in order to have good suspension of the vaginal  apex.  I went vaginally and did a digital exam to have a feeling of it.  We then trimmed the mesh at that length.  The instruments were changed to needle drivers and the cortex was inserted at a length of 8 inches. 3 separate stitches were done at S1 and on the promontory.  We then closed the peritoneum to bury the mesh with a running suture of Vicryl 2-0.  Hemostasis was adequate at all levels. Pictures were taken of the adhesions as we started and the final  picture as the procedure was finished.  All needles were removed from the abdomen as we went along. All instruments were removed. The robot was undocked. The Trendelanberg was reversed.  By laparoscopy we irrigated and suctioned the abdominopelvic cavities. We inserted an Interceed to decrease the risk of reformation of adhesions.  Hemostasis was adequate at all levels.  We then closed the supraumbilical incision by attaching the pursestring stitch. We then put a Vicryl 4-0 in a subcuticular stitch on all incisions. And added Dermabond on all incisions. Hemostasis was adequate at that level as well. Dr. Ernestina Penna then proceeded with the TVT and cystoscopy. I then took over again and proceeded with the posterior repair.  Allises were put on either side of the fourchette.  The scalpel was used to resect a triangular area of skin at the perineum.  We then used a stroley scissors to dissect just under the mucosa of the posterior vagina.  And sectioned in the midline with the straight scissors. We then push down the rectocele.  We used separate stitches of Vicryl 2-0 to reduce the rectocele and control hemostasis. We trimmed the excess vaginal mucosa on each side. And we closed the vaginal mucosa with a locked running suture of Vicryl 3-0.  Hemostasis was adequate. The perineum was reinforced by 2 separate stitches of Vicryl 2-0 on the Levator ani muscles.  And the skin was reapproximated by a subcuticular stitch of Vicryl 3-0.  Hemostasis was adequate.  Date mesh with Estrace cream was inserted in the vagina.  The patient was brought to recovery room in good and stable status.  ESTIMATED BLOOD LOSS: 250 cc   Intake/Output Summary (Last 24 hours) at 11/26/11 1323 Last data filed at 11/26/11 1258  Gross per 24 hour  Intake   2100 ml  Output    650 ml  Net   1450 ml     BLOOD ADMINISTERED:none   LOCAL MEDICATIONS USED:  MARCAINE     SPECIMEN:  No Specimen  DISPOSITION OF SPECIMEN:  N/A  COUNTS:   YES  PLAN OF CARE: Transfer to PACU    Dr Genia Del  11/26/11 at 13:25

## 2011-11-26 NOTE — H&P (Signed)
Jennifer Rubio is an 61 y.o. female G2P2  RA:   Pelvic floor relaxation with colpo/cysto/rectocele.  SUI.  Pertinent Gynecological History:  Blood transfusions:  none Sexually transmitted diseases: no past history Previous GYN Procedures: Hysterectomy  Last mammogram: normal Menstrual History:  No LMP recorded. Patient has had a hysterectomy.    Past Medical History  Diagnosis Date  . Hypertension   . Osteoarthritis   . Cellulitis and abscess of leg January 2013  . Urinary tract infection January 2013    Past Surgical History  Procedure Date  . Abdominal hysterectomy   . Knee arthroscopy     both knees    Family History  Problem Relation Age of Onset  . Hypertension Father   . Diabetes Sister   . Diabetes Brother     Social History:  reports that she has never smoked. She does not have any smokeless tobacco history on file. She reports that she does not drink alcohol or use illicit drugs.  Allergies:  Allergies  Allergen Reactions  . Sulfa Antibiotics Anaphylaxis    Swelling     Prescriptions prior to admission  Medication Sig Dispense Refill  . amLODipine (NORVASC) 5 MG tablet Take 5 mg by mouth every morning.      Marland Kitchen lisinopril-hydrochlorothiazide (PRINZIDE,ZESTORETIC) 20-12.5 MG per tablet Take 1 tablet by mouth every morning.         Blood pressure 99/78, pulse 70, temperature 97.7 F (36.5 C), temperature source Oral, resp. rate 16, height 5\' 1"  (1.549 m), weight 68.493 kg (151 lb), SpO2 100.00%.   EKG this am wnl  Cysto/recto/colpocele per exam at [facility]  Results for orders placed during the hospital encounter of 11/26/11 (from the past 24 hour(s))  GLUCOSE, CAPILLARY     Status: Abnormal   Collection Time   11/26/11  6:58 AM      Component Value Range   Glucose-Capillary 110 (*) 70 - 99 (mg/dL)    No results found.  Assessment/Plan:  Sxic colpo/recto/cystocele with SUI.  Sacrocolpopexy assisted with [employer], Sling procedure.  Possible A/P  repair.  Surgery and risks reviewed with patient.   Jennifer Rubio,MARIE-LYNE 11/26/2011, 7:25 AM

## 2011-11-26 NOTE — Anesthesia Preprocedure Evaluation (Addendum)
Anesthesia Evaluation  Patient identified by MRN, date of birth, ID band Patient awake    Reviewed: Allergy & Precautions, H&P , NPO status , Patient's Chart, lab work & pertinent test results  Airway Mallampati: III TM Distance: >3 FB Neck ROM: Full    Dental No notable dental hx. (+) Teeth Intact   Pulmonary neg pulmonary ROS,  breath sounds clear to auscultation  Pulmonary exam normal       Cardiovascular hypertension, Pt. on medications Rhythm:Regular Rate:Normal     Neuro/Psych negative neurological ROS  negative psych ROS   GI/Hepatic negative GI ROS, Neg liver ROS,   Endo/Other  negative endocrine ROS  Renal/GU negative Renal ROS  negative genitourinary   Musculoskeletal negative musculoskeletal ROS (+)   Abdominal   Peds  Hematology negative hematology ROS (+)   Anesthesia Other Findings   Reproductive/Obstetrics negative OB ROS                           Anesthesia Physical Anesthesia Plan  ASA: II  Anesthesia Plan: General   Post-op Pain Management:    Induction: Intravenous  Airway Management Planned: Oral ETT  Additional Equipment:   Intra-op Plan:   Post-operative Plan: Extubation in OR  Informed Consent: I have reviewed the patients History and Physical, chart, labs and discussed the procedure including the risks, benefits and alternatives for the proposed anesthesia with the patient or authorized representative who has indicated his/her understanding and acceptance.   Dental advisory given  Plan Discussed with: CRNA, Anesthesiologist and Surgeon  Anesthesia Plan Comments:        Anesthesia Quick Evaluation

## 2011-11-27 LAB — CBC
Hemoglobin: 9 g/dL — ABNORMAL LOW (ref 12.0–15.0)
MCH: 26.9 pg (ref 26.0–34.0)
Platelets: 114 10*3/uL — ABNORMAL LOW (ref 150–400)
RBC: 3.34 MIL/uL — ABNORMAL LOW (ref 3.87–5.11)
WBC: 11.5 10*3/uL — ABNORMAL HIGH (ref 4.0–10.5)

## 2011-11-27 MED ORDER — IBUPROFEN 600 MG PO TABS: 600.0000 mg | ORAL_TABLET | Freq: Four times a day (QID) | ORAL | Status: AC | PRN

## 2011-11-27 MED ORDER — NITROFURANTOIN MONOHYD MACRO 100 MG PO CAPS: 100.0000 mg | ORAL_CAPSULE | ORAL | Status: AC

## 2011-11-27 MED ORDER — DOCUSATE SODIUM 100 MG PO CAPS: 100.0000 mg | ORAL_CAPSULE | Freq: Two times a day (BID) | ORAL | Status: AC

## 2011-11-27 MED ORDER — OXYCODONE-ACETAMINOPHEN 5-325 MG PO TABS: 1.0000 | ORAL_TABLET | ORAL | Status: AC | PRN

## 2011-11-27 MED ORDER — FERROUS SULFATE 325 (65 FE) MG PO TABS: 325.0000 mg | ORAL_TABLET | Freq: Every day | ORAL | Status: DC

## 2011-11-27 NOTE — Discharge Instructions (Signed)
POST-OPERATIVE INSTRUCTIONS TO PATIENT  Call [facility]  for excessive pain, bleeding or temperature greater than or equal to 100.4 degrees (orally).    No driving for 1 week No lifting (more than 20 lbs) for 3 weeks No sexual activity for 6 weeks  Pain management: Use Ibuprofen 600 mg every 6 hours for 5 days and then as needed.           Use your pain medication as needed to maintain a pain level at or            below 3/10             Use Colace 1-2 capsules per day as [place] as you are using pain            medication to avoid constipation.       Diet: normal  Bathing: may shower day after surgery  Wound Care: keep incisions clean and dry  Return to Dr. Seymour Bars in 3 wks  Return to work: To be determined at post operative visit.    Valma Rotenberg,MARIE-LYNE MD 11/27/2011 5:13 PM

## 2011-11-27 NOTE — Progress Notes (Signed)
1 Day Post-Op Procedure(s) (LRB): ROBOTIC ASSISTED LAPAROSCOPIC SACROCOLPOPEXY (N/A) TRANSVAGINAL TAPE (TVT) PROCEDURE (N/A) CYSTOSCOPY (N/A) POSTERIOR REPAIR (RECTOCELE) (N/A) ROBOTIC ASSISTED LAPAROSCOPIC LYSIS OF ADHESION (N/A)  Subjective: Patient reports that pain is well managed.  Tolerating normal diet as tolerated  diet without difficulty. No nausea / vomiting.  Ambulating and good diuresis, Foley in place.  Objective: BP 89/46  Pulse 87  Temp(Src) 97.7 F (36.5 C) (Oral)  Resp 16  Ht 5\' 1"  (1.549 m)  Wt 68.493 kg (151 lb)  BMI 28.53 kg/m2  SpO2 95% Lungs: clear Heart: normal rate and rhythm Abdomen:soft and appropriately tender Extremities: Homans sign is negative, no sign of DVT Incision: healing well Mesh removed this am  Assessment: s/p Procedure(s): ROBOTIC ASSISTED LAPAROSCOPIC SACROCOLPOPEXY TRANSVAGINAL TAPE (TVT) PROCEDURE CYSTOSCOPY POSTERIOR REPAIR (RECTOCELE) ROBOTIC ASSISTED LAPAROSCOPIC LYSIS OF ADHESION: progressing well  Plan: Discharge home.  Post op recommendations given.  D/C with Foley, ABTx PO, D/C Foley Friday at [facility].  LOS: 1 day    Jennifer Rubio,MARIE-LYNE 11/27/2011, 4:58 PM

## 2011-11-27 NOTE — Progress Notes (Signed)
Pt notes "bottom sore". No abdominal pain. tol liquids last pm, Hungry for breakfast this am. Pain controlled. Out of bed this am for trial of void. Pt notes dizziness w/ getting up. Did not pass trial of void, unable to void. Pt notes minimal vaginal spotting, no bleeeding. No CP/ SOB.  PE:  Filed Vitals:   11/26/11 1630 11/26/11 2041 11/27/11 0142 11/27/11 0532  BP: 107/69 103/67 91/60 91/51   Pulse: 100 91 87 73  Temp: 97.5 F (36.4 C) 98.1 F (36.7 C) 98.6 F (37 C) 98.3 F (36.8 C)  TempSrc:  Oral Oral Oral  Resp: 14 16 16 16   Height:      Weight:      SpO2: 99% 100% 99% 98%   CV- RRR Pulm- CTAB Abd: soft, approp tender, ND,  Inc: dermabond, well approximated, no erythema. GU: no staining on pad LE: SCDs on, no edema  CBC    Component Value Date/Time   WBC 11.5* 11/27/2011 0540   RBC 3.34* 11/27/2011 0540   HGB 9.0* 11/27/2011 0540   HCT 28.5* 11/27/2011 0540   PLT 114* 11/27/2011 0540   MCV 85.3 11/27/2011 0540   MCH 26.9 11/27/2011 0540   MCHC 31.6 11/27/2011 0540   RDW 14.6 11/27/2011 0540   LYMPHSABS 2.5 10/19/2011 2204   MONOABS 0.6 10/19/2011 2204   EOSABS 0.1 10/19/2011 2204   BASOSABS 0.0 10/19/2011 2204    A/P: POD#1 sacrocolpoxy, rectocele repair, TVT sling. - post-op. Recovering appropriately. Cont po pain meds, advance diet. - failed trial of void, otherwise good Uout. Will d/c home w/ foley, f/u in office on Friday for repeat trial of void. Foley teaching today. Macrobid prophylaxis. - anemia. Start iron/ colace. - Dispo, home today, f/u in office  Kashten Gowin A. 11/27/2011 8:30 AM

## 2011-11-27 NOTE — Addendum Note (Signed)
Addendum  created 11/27/11 0800 by Truitt Leep, CRNA   Modules edited:Notes Section

## 2011-11-27 NOTE — Plan of Care (Signed)
Problem: Phase II Progression Outcomes Goal: Foley discontinued Outcome: Not Applicable Date Met:  11/27/11 Patient being sent home with Foley and to return to office on Friday

## 2011-11-27 NOTE — Progress Notes (Signed)
Pt discharged to home with adult female, Ethelene Browns.  Condition stable.  Pt to car via wheelchair with NT.  Pt discharged home with foley catheter.  Prior to discharge, instructed pt regarding emptying large drainage bag, switching between large drainage bag and leg bag, and cleaning catheter daily in shower.  Pt provided return demonstration for first two points above.  Pt stated she was uncomfortable going home with catheter, but that she understood instructions and felt that she could care for it without difficulty.

## 2011-11-27 NOTE — Discharge Summary (Signed)
  Physician Discharge Summary  Patient ID: Jennifer Rubio MRN: [id number] DOB/AGE: 06-07-1951 61 y.o.  Admit date: 11/26/2011 Discharge date: 11/27/2011  Admission Diagnoses: Colpocele, Cystocele, Rectocele  Discharge Diagnoses: Colpocele, Cystocele, Rectocele        Active Problems:  * No active hospital problems. *    Discharged Condition: good  Hospital Course:   Consults: None  Treatments: surgery: Robotic Sacrocolpopexy with extensive lysis of adhesions, TVT, cystoscopy, posterior repair.  Disposition: 01-Home or Self Care   Medication List  As of 11/27/2011  5:11 PM   TAKE these medications         amLODipine 5 MG tablet   Commonly known as: NORVASC   Take 5 mg by mouth every morning.      docusate sodium 100 MG capsule   Commonly known as: COLACE   Take 1 capsule (100 mg total) by mouth every 12 (twelve) hours.      ferrous sulfate 325 (65 FE) MG tablet   Take 1 tablet (325 mg total) by mouth daily with breakfast.      ibuprofen 600 MG tablet   Commonly known as: ADVIL,MOTRIN   Take 1 tablet (600 mg total) by mouth every 6 (six) hours as needed for pain (mild pain).      lisinopril-hydrochlorothiazide 20-12.5 MG per tablet   Commonly known as: PRINZIDE,ZESTORETIC   Take 1 tablet by mouth every morning.      nitrofurantoin (macrocrystal-monohydrate) 100 MG capsule   Commonly known as: MACROBID   Take 1 capsule (100 mg total) by mouth 1 day or 1 dose.      oxyCODONE-acetaminophen 5-325 MG per tablet   Commonly known as: PERCOCET   Take 1-2 tablets by mouth every 3 (three) hours as needed (moderate to severe pain (when tolerating fluids)).             SignedGenia Del, MD 11/27/2011, 5:11 PM

## 2011-11-27 NOTE — Anesthesia Postprocedure Evaluation (Signed)
  Anesthesia Post-op Note  Patient: Jennifer Rubio  Procedure(s) Performed: Procedure(s) (LRB): ROBOTIC ASSISTED LAPAROSCOPIC SACROCOLPOPEXY (N/A) TRANSVAGINAL TAPE (TVT) PROCEDURE (N/A) CYSTOSCOPY (N/A) POSTERIOR REPAIR (RECTOCELE) (N/A) ROBOTIC ASSISTED LAPAROSCOPIC LYSIS OF ADHESION (N/A)  Patient Location: PACU and [facility]'s Unit  Anesthesia Type: General  Level of Consciousness: awake, alert  and oriented  Airway and Oxygen Therapy: Patient Spontanous Breathing  Post-op Pain: mild  Post-op Assessment: Post-op Vital signs reviewed, Patient's Cardiovascular Status Stable, Respiratory Function Stable and Pain level controlled  Post-op Vital Signs: stable  Complications: No apparent anesthesia complications

## 2011-12-03 ENCOUNTER — Encounter (HOSPITAL_COMMUNITY): Payer: Self-pay | Admitting: Obstetrics & Gynecology

## 2012-01-21 ENCOUNTER — Emergency Department (HOSPITAL_COMMUNITY): Payer: BC Managed Care – PPO

## 2012-01-21 ENCOUNTER — Emergency Department (HOSPITAL_COMMUNITY)
Admission: EM | Admit: 2012-01-21 | Discharge: 2012-01-22 | Disposition: A | Discharge status: Home / Self Care | Payer: BC Managed Care – PPO | Attending: Emergency Medicine | Admitting: Emergency Medicine

## 2012-01-21 ENCOUNTER — Encounter (HOSPITAL_COMMUNITY): Payer: Self-pay | Admitting: *Deleted

## 2012-01-21 DIAGNOSIS — I10 Essential (primary) hypertension: Secondary | ICD-10-CM | POA: Insufficient documentation

## 2012-01-21 DIAGNOSIS — W010XXA Fall on same level from slipping, tripping and stumbling without subsequent striking against object, initial encounter: Secondary | ICD-10-CM | POA: Insufficient documentation

## 2012-01-21 DIAGNOSIS — R079 Chest pain, unspecified: Secondary | ICD-10-CM | POA: Insufficient documentation

## 2012-01-21 DIAGNOSIS — S20219A Contusion of unspecified front wall of thorax, initial encounter: Secondary | ICD-10-CM | POA: Insufficient documentation

## 2012-01-21 IMAGING — CR DG RIBS W/ CHEST 3+V*L*
4 series · 4 of 4 positions shown · non-contrast
Comparison: None.

CLINICAL DATA: Post fall, now with left anterior and lateral rib
pain

LEFT RIBS AND CHEST - 3+ VIEW

[view not recorded (1 of 4)]
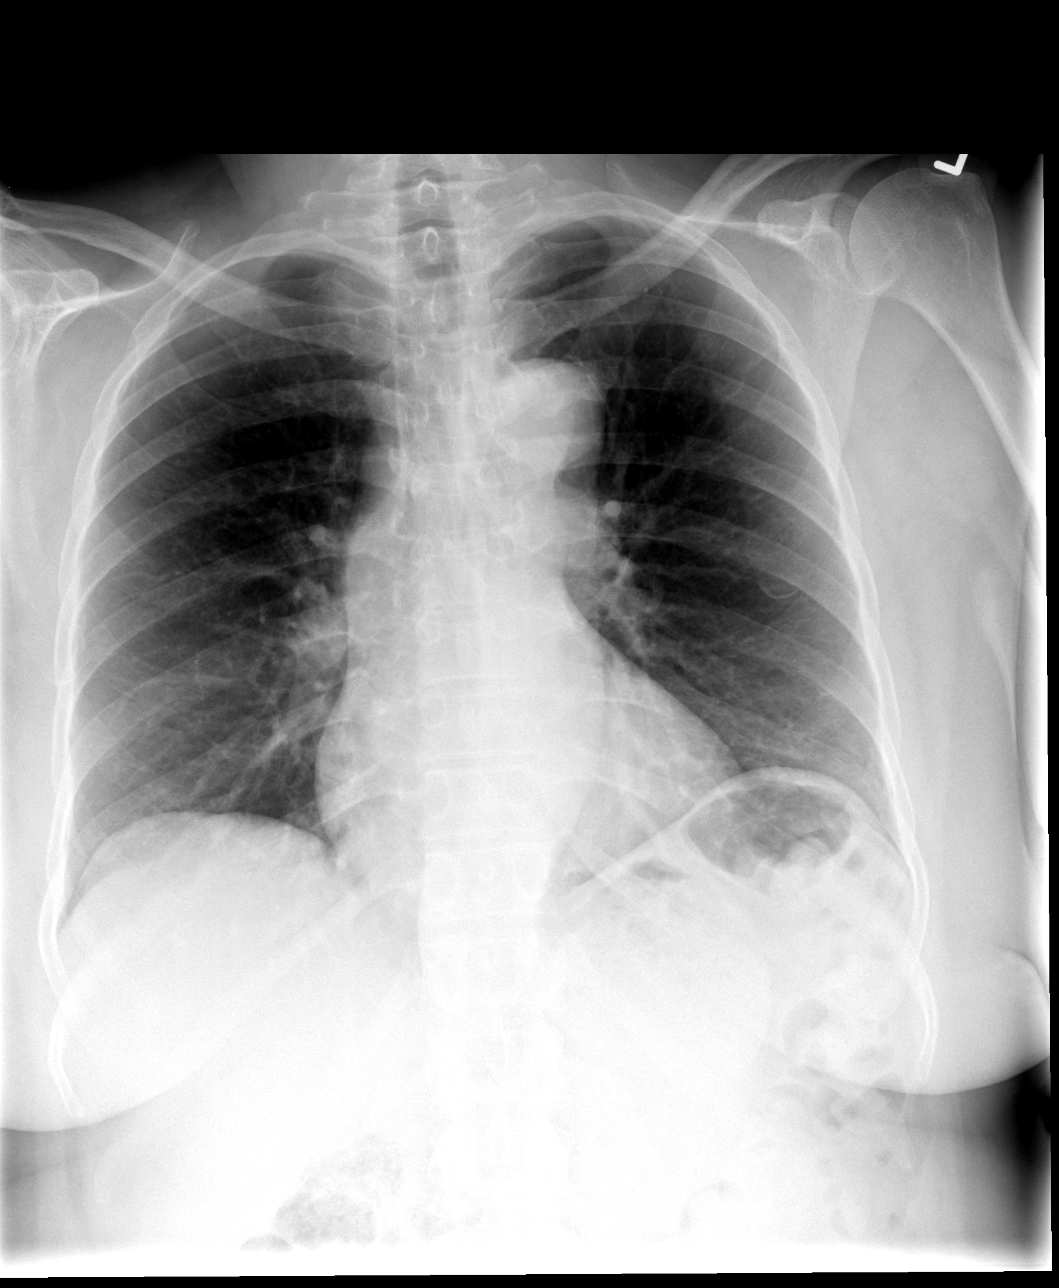

[view not recorded (2 of 4)]
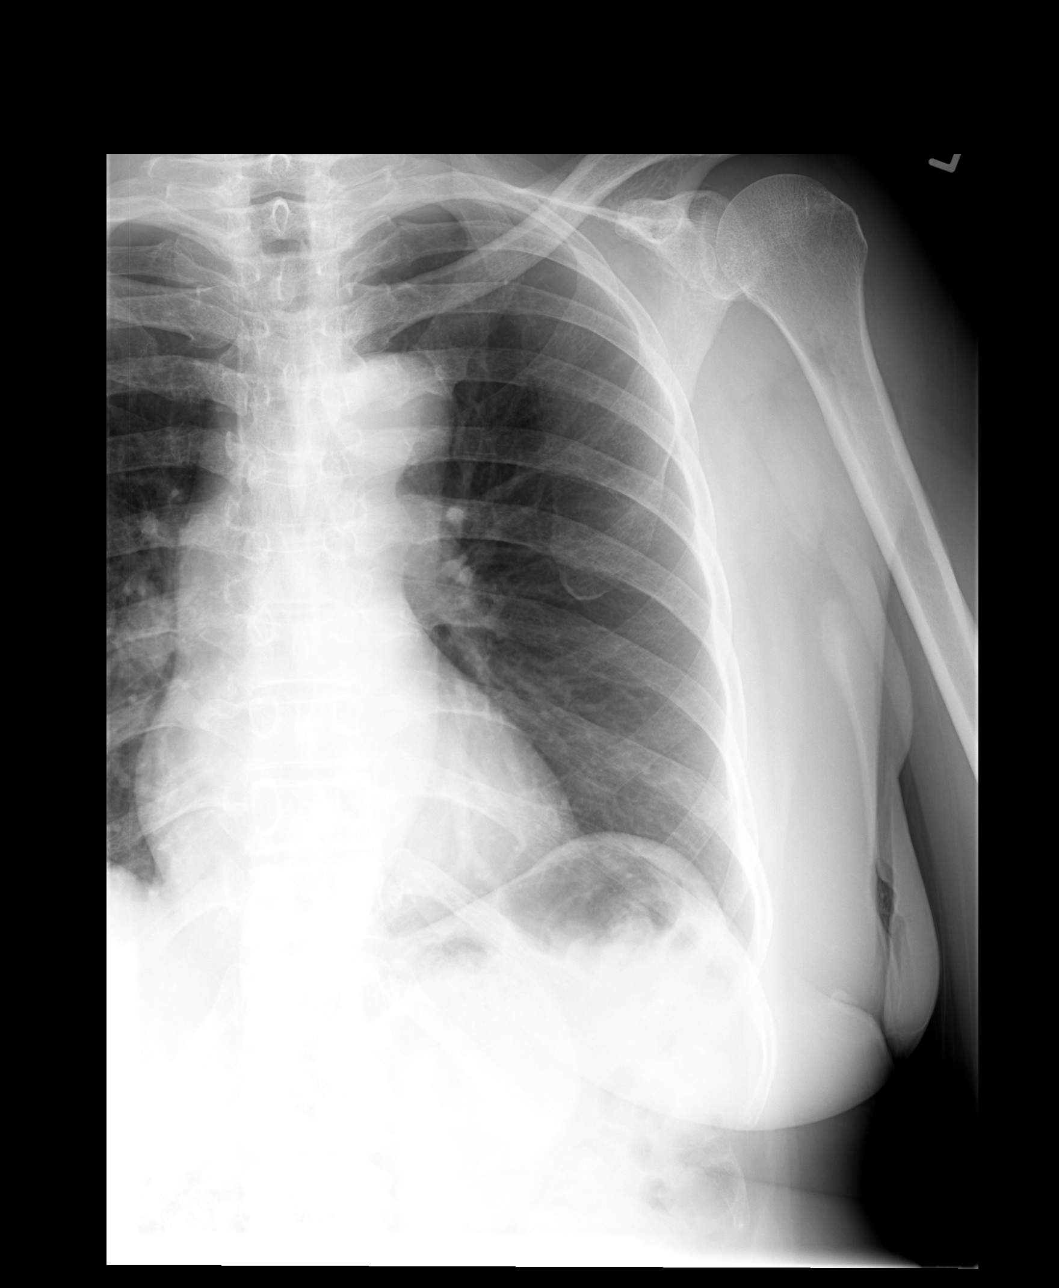

[view not recorded (3 of 4)]
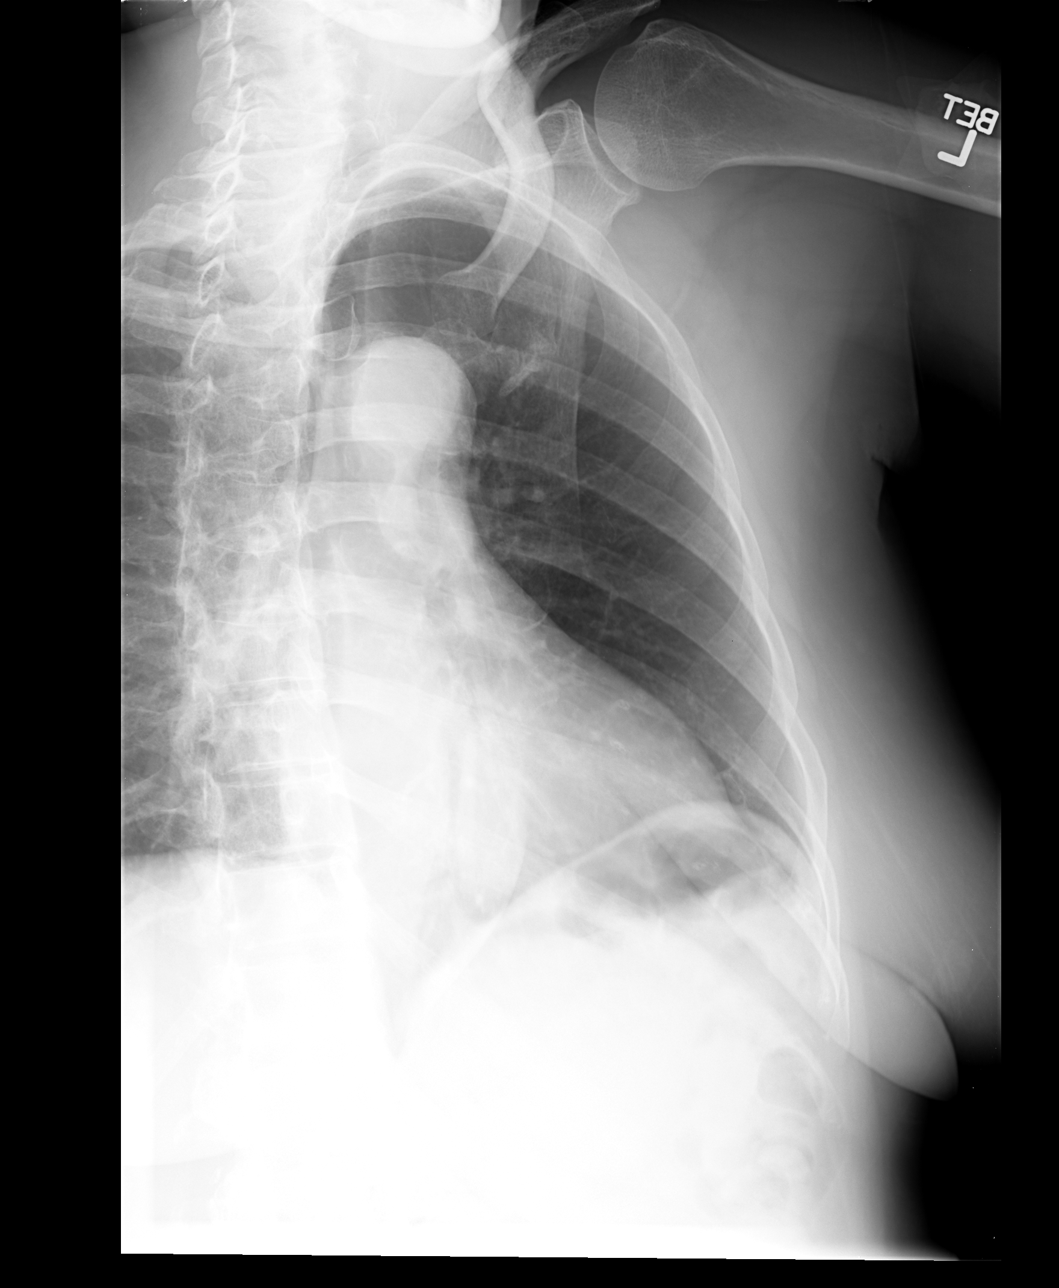

[view not recorded (4 of 4)]
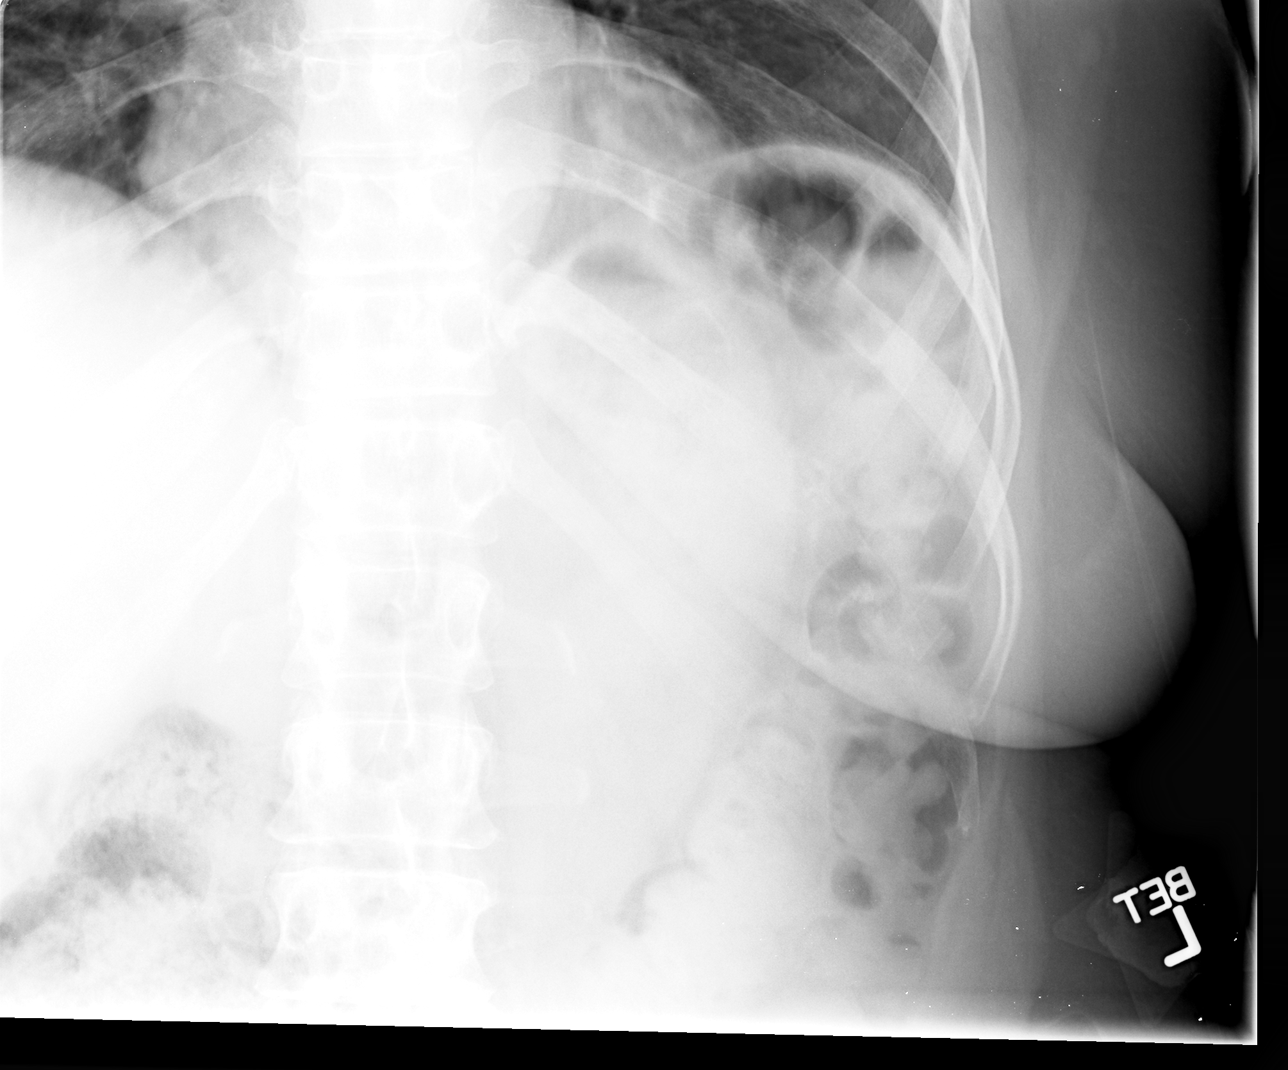

[4 of 4 positions shown; findings below may reference images not displayed]

FINDINGS: Normal cardiac silhouette.  There is mild ectasia and tortuosity
thoracic aorta.  There is mild eventration of the bilateral
hemidiaphragms.  No focal airspace opacities.  No definite pleural
effusion or pneumothorax.

No displaced rib fractures.
IMPRESSION: 1.  No acute cardiopulmonary disease, specifically, no displaced
left-sided rib fractures.
2.  Mild ectasia and tortuosity of the thoracic aorta.

## 2012-01-21 MED ORDER — HYDROCODONE-ACETAMINOPHEN 5-325 MG PO TABS: ORAL_TABLET | ORAL | Status: AC

## 2012-01-21 MED ORDER — HYDROCODONE-ACETAMINOPHEN 5-325 MG PO TABS
1.0000 | ORAL_TABLET | Freq: Once | ORAL | Status: AC
  Administered 2012-01-21: 1 via ORAL
  Filled 2012-01-21: qty 1

## 2012-01-21 NOTE — ED Provider Notes (Signed)
History     CSN: [id number]  Arrival date & time 01/21/12  2209   First MD Initiated Contact with Patient 01/21/12 2325      Chief Complaint  Patient presents with  . Fall    (Consider location/radiation/quality/duration/timing/severity/associated sxs/prior treatment) HPI Comments: Patient c/o localized pain to her left anterior ribs for nine days.  States the pain began after she tripped and fell, striking her left chest wall on the wooden frame of her bed.  She states the pain improves with rest and worsens with deep breathing and movement.  She also states the pain became worse today after work.  States she does lifting, pushing and pulling at work.  She denies abdominal pain, shortness of breath, nausea/vomiting  or back pain   The history is provided by the patient.    Past Medical History  Diagnosis Date  . Hypertension   . Osteoarthritis   . Cellulitis and abscess of leg January 2013  . Urinary tract infection January 2013    Past Surgical History  Procedure Date  . Abdominal hysterectomy   . Knee arthroscopy     both knees  . Robotic assisted laparoscopic sacrocolpopexy 11/26/2011    Procedure: ROBOTIC ASSISTED LAPAROSCOPIC SACROCOLPOPEXY;  Surgeon: Genia Del, MD;  Location: [facility];  Service: Gynecology;  Laterality: N/A;  . Bladder suspension 11/26/2011    Procedure: TRANSVAGINAL TAPE (TVT) PROCEDURE;  Surgeon: Genia Del, MD;  Location: [facility];  Service: Gynecology;  Laterality: N/A;  . Cystoscopy 11/26/2011    Procedure: CYSTOSCOPY;  Surgeon: Genia Del, MD;  Location: [facility];  Service: Gynecology;  Laterality: N/A;  . Rectocele repair 11/26/2011    Procedure: POSTERIOR REPAIR (RECTOCELE);  Surgeon: Genia Del, MD;  Location: [facility];  Service: Gynecology;  Laterality: N/A;    Family History  Problem Relation Age of Onset  . Hypertension Father   . Diabetes Sister   . Diabetes Brother     History  Substance Use Topics  . Smoking  status: Never Smoker   . Smokeless tobacco: Not on file  . Alcohol Use: No    OB History    Grav Para Term Preterm Abortions TAB SAB Ect Mult Living                  Review of Systems  Constitutional: Negative for activity change and appetite change.  HENT: Negative for neck pain.   Respiratory: Negative for cough, chest tightness, shortness of breath, wheezing and stridor.   Cardiovascular: Positive for chest pain.  Gastrointestinal: Negative for nausea, vomiting and abdominal pain.  Genitourinary: Negative for hematuria, flank pain and difficulty urinating.  Musculoskeletal: Negative for back pain.  Skin: Negative.   Neurological: Negative for dizziness, weakness and numbness.  All other systems reviewed and are negative.    Allergies  Sulfa antibiotics  Home Medications   Current Outpatient Rx  Name Route Sig Dispense Refill  . AMLODIPINE BESYLATE 5 MG PO TABS Oral Take 5 mg by mouth every morning.    Marland Kitchen FERROUS SULFATE 325 (65 FE) MG PO TABS Oral Take 1 tablet (325 mg total) by mouth daily with breakfast. 30 tablet 11  . LISINOPRIL-HYDROCHLOROTHIAZIDE 20-12.5 MG PO TABS Oral Take 1 tablet by mouth every morning.      BP 111/75  Pulse 85  Temp(Src) 97.9 F (36.6 C) (Oral)  Resp 16  Ht 5\' 1"  (1.549 m)  Wt 154 lb (69.854 kg)  BMI 29.10 kg/m2  SpO2 100%  Physical Exam  Nursing note and vitals reviewed. Constitutional: She is oriented to person, place, and time. She appears well-developed and well-nourished. No distress.  HENT:  Head: Normocephalic and atraumatic.  Neck: Normal range of motion. Neck supple.  Cardiovascular: Normal rate, regular rhythm, normal heart sounds and intact distal pulses.   No murmur heard. Pulmonary/Chest: Effort normal and breath sounds normal. No respiratory distress. She has no wheezes. She has no rales. She exhibits tenderness. She exhibits no laceration, no crepitus, no edema, no deformity, no swelling and no retraction.          Localized ttp of the anterolateral left ribs.  No crepitus, bruising or abrasions  Abdominal: Soft. She exhibits no distension. There is no tenderness. There is no rebound and no guarding.  Musculoskeletal: Normal range of motion. She exhibits no edema.  Lymphadenopathy:    She has no cervical adenopathy.  Neurological: She is alert and oriented to person, place, and time. She exhibits normal muscle tone. Coordination normal.  Skin: Skin is warm and dry.  Psychiatric: She has a normal mood and affect.    ED Course  Procedures (including critical care time)  Labs Reviewed - No data to display Dg Ribs Unilateral W/chest Left  01/21/2012  *RADIOLOGY REPORT*  Clinical Data: Post fall, now with left anterior and lateral rib pain  LEFT RIBS AND CHEST - 3+ VIEW  Comparison: None.  Findings:  Normal cardiac silhouette.  There is mild ectasia and tortuosity thoracic aorta.  There is mild eventration of the bilateral hemidiaphragms.  No focal airspace opacities.  No definite pleural effusion or pneumothorax.  No displaced rib fractures.  IMPRESSION: 1.  No acute cardiopulmonary disease, specifically, no displaced left-sided rib fractures. 2.  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  Original Report Authenticated By: Waynard Reeds, M.D.        MDM    Previous medical charts, nursing notes and vitals signs from this visit were reviewed by me   All laboratory results and/or imaging results performed on this visit, if applicable, were reviewed by me and discussed with the patient and/or parent as well as recommendation for follow-up    MEDICATIONS GIVEN IN ED:  norco po  Patient has ttp of the anterior left ribs.  No crepitus, edema or bruising.  Pain is also reproduced with cough.  abd is soft, NT.  Lung sounds are CTA bilaterally.  Likely chest wall contusion vs occult rib fx.  Pt agrees to close f/u with her PMD .  She has an incentive spirometer at home and I have advised her to  use.      PRESCRIPTIONS GIVEN AT DISCHARGE: norco     Pt stable in ED with no significant deterioration in condition. Pt feels improved after observation and/or treatment in ED. Patient / Family / Caregiver understand and agree with initial ED impression and plan with expectations set for ED visit.  Patient agrees to return to ED for any worsening symptoms          Karuna Balducci L. [place], [place] 01/22/12 [phone]

## 2012-01-21 NOTE — ED Notes (Signed)
Slipped and fell 4/21,  Pain lt antero lat ribs.

## 2012-01-21 NOTE — Discharge Instructions (Signed)
Chest Contusion A contusion is a deep bruise. Bruises happen when an injury causes bleeding under the skin. Signs of bruising include pain, puffiness (swelling), and discolored skin. The bruise may turn blue, purple, or yellow. Pay attention to how you are doing. HOME CARE  Put ice on the injured area.   Put ice in a plastic bag.   Place a towel between the skin and the bag.   Leave the ice on for 15 to 20 minutes at a time, 3 to 4 times a day for the first 48 hours.   Rest.   Do not lift anything heavy.   Limit your activity as told by your doctor   Take 3 to 4 deep breaths every hour while awake. Hold your hand or a pillow over the sore area for support.   Breathe from the belly (abdomen).   Breathe in through the nose, as if you are smelling a flower.   Breathe out through the mouth, as if you are blowing out a candle.   Only take medicine as told by your doctor.  GET HELP RIGHT AWAY IF:   You have trouble breathing or cough up thick spit (mucus).   You have chest pain that goes into the arms or jaw.   The skin is wet and pale.   You have a fever.   You feel dizzy, weak, or pass out (faint).   You cannot breathe easily.   The bruise is getting worse.  MAKE SURE YOU:   Understand these instructions.   Will watch your condition.   Will get help right away if you are not doing well or get worse.  Document Released: 02/27/2008 Document Revised: 08/30/2011 Document Reviewed: 02/27/2008 [facility] Patient Information 2012 [place], [place].Rib Contusion A rib contusion (bruise) can occur by a blow to the chest or by a fall against a hard object. Usually these will be much better in a couple weeks. If X-rays were taken today and there are no broken bones (fractures), the diagnosis of bruising is made. However, broken ribs may not show up for several days, or may be discovered later on a routine X-ray when signs of healing show up. If this happens to you, it does not mean  that something was missed on the X-ray, but simply that it did not show up on the first X-rays. Earlier diagnosis will not usually change the treatment. HOME CARE INSTRUCTIONS   Avoid strenuous activity. Be careful during activities and avoid bumping the injured ribs. Activities that pull on the injured ribs and cause pain should be avoided, if possible.   For the first day or two, an ice pack used every 20 minutes while awake may be helpful. Put ice in a plastic bag and put a towel between the bag and the skin.   Eat a normal, well-balanced diet. Drink plenty of fluids to avoid constipation.   Take deep breaths several times a day to keep lungs free of infection. Try to cough several times a day. Splint the injured area with a pillow while coughing to ease pain. Coughing can help prevent pneumonia.   Wear a rib belt or binder only if told to do so by your caregiver. If you are wearing a rib belt or binder, you must do the breathing exercises as directed by your caregiver. If not used properly, rib belts or binders restrict breathing which can lead to pneumonia.   Only take over-the-counter or prescription medicines for pain, discomfort, or fever as  directed by your caregiver.  SEEK MEDICAL CARE IF:   You or your child has an oral temperature above 102 F (38.9 C).   Your baby is older than 3 months with a rectal temperature of 100.5 F (38.1 C) or higher for more than 1 day.   You develop a cough, with thick or bloody sputum.  SEEK IMMEDIATE MEDICAL CARE IF:   You have difficulty breathing.   You feel sick to your stomach (nausea), have vomiting or belly (abdominal) pain.   You have worsening pain, not controlled with medications, or there is a change in the location of the pain.   You develop sweating or radiation of the pain into the arms, jaw or shoulders, or become light headed or faint.   You or your child has an oral temperature above 102 F (38.9 C), not controlled by  medicine.   Your or your baby is older than 3 months with a rectal temperature of 102 F (38.9 C) or higher.   Your baby is 31 months old or younger with a rectal temperature of 100.4 F (38 C) or higher.  MAKE SURE YOU:   Understand these instructions.   Will watch your condition.   Will get help right away if you are not doing well or get worse.  Document Released: 06/05/2001 Document Revised: 08/30/2011 Document Reviewed: 04/28/2008 [facility] Patient Information 2012 [place], [place].

## 2012-01-22 NOTE — ED Provider Notes (Signed)
Medical screening examination/treatment/procedure(s) were performed by non-physician practitioner and as supervising physician I was immediately available for consultation/collaboration.   Leasa Kincannon D Jaeleen Inzunza, MD 01/22/12 0332 

## 2012-09-09 ENCOUNTER — Ambulatory Visit: Payer: Self-pay

## 2012-09-17 ENCOUNTER — Encounter (HOSPITAL_COMMUNITY): Payer: Self-pay | Admitting: Emergency Medicine

## 2012-09-17 ENCOUNTER — Emergency Department (HOSPITAL_COMMUNITY): Payer: BC Managed Care – PPO

## 2012-09-17 ENCOUNTER — Emergency Department (HOSPITAL_COMMUNITY)
Admission: EM | Admit: 2012-09-17 | Discharge: 2012-09-17 | Disposition: A | Discharge status: Home / Self Care | Payer: BC Managed Care – PPO | Attending: Emergency Medicine | Admitting: Emergency Medicine

## 2012-09-17 DIAGNOSIS — I10 Essential (primary) hypertension: Secondary | ICD-10-CM | POA: Insufficient documentation

## 2012-09-17 DIAGNOSIS — J111 Influenza due to unidentified influenza virus with other respiratory manifestations: Secondary | ICD-10-CM | POA: Insufficient documentation

## 2012-09-17 DIAGNOSIS — Z872 Personal history of diseases of the skin and subcutaneous tissue: Secondary | ICD-10-CM | POA: Insufficient documentation

## 2012-09-17 DIAGNOSIS — IMO0001 Reserved for inherently not codable concepts without codable children: Secondary | ICD-10-CM | POA: Insufficient documentation

## 2012-09-17 DIAGNOSIS — R52 Pain, unspecified: Secondary | ICD-10-CM | POA: Insufficient documentation

## 2012-09-17 DIAGNOSIS — J3489 Other specified disorders of nose and nasal sinuses: Secondary | ICD-10-CM | POA: Insufficient documentation

## 2012-09-17 DIAGNOSIS — R51 Headache: Secondary | ICD-10-CM | POA: Insufficient documentation

## 2012-09-17 DIAGNOSIS — Z87440 Personal history of urinary (tract) infections: Secondary | ICD-10-CM | POA: Insufficient documentation

## 2012-09-17 DIAGNOSIS — J029 Acute pharyngitis, unspecified: Secondary | ICD-10-CM | POA: Insufficient documentation

## 2012-09-17 DIAGNOSIS — Z79899 Other long term (current) drug therapy: Secondary | ICD-10-CM | POA: Insufficient documentation

## 2012-09-17 DIAGNOSIS — M1990 Unspecified osteoarthritis, unspecified site: Secondary | ICD-10-CM | POA: Insufficient documentation

## 2012-09-17 LAB — CBC WITH DIFFERENTIAL/PLATELET
Eosinophils Absolute: 0 10*3/uL (ref 0.0–0.7)
Eosinophils Relative: 1 % (ref 0–5)
HCT: 36 % (ref 36.0–46.0)
Lymphocytes Relative: 27 % (ref 12–46)
Lymphs Abs: 1.2 10*3/uL (ref 0.7–4.0)
MCH: 28 pg (ref 26.0–34.0)
MCV: 84.7 fL (ref 78.0–100.0)
Monocytes Absolute: 0.6 10*3/uL (ref 0.1–1.0)
Monocytes Relative: 13 % — ABNORMAL HIGH (ref 3–12)
RBC: 4.25 MIL/uL (ref 3.87–5.11)
WBC: 4.5 10*3/uL (ref 4.0–10.5)

## 2012-09-17 LAB — BASIC METABOLIC PANEL
BUN: 13 mg/dL (ref 6–23)
CO2: 26 mEq/L (ref 19–32)
Calcium: 9 mg/dL (ref 8.4–10.5)
Creatinine, Ser: 0.79 mg/dL (ref 0.50–1.10)
GFR calc non Af Amer: 89 mL/min — ABNORMAL LOW (ref >90)
Glucose, Bld: 85 mg/dL (ref 70–99)

## 2012-09-17 IMAGING — CR DG CHEST 2V
2 series · 2 of 2 positions shown · non-contrast
Comparison: [DATE]

CLINICAL DATA: Generalized body ache, cough

CHEST - 2 VIEW

[view not recorded (1 of 2)]
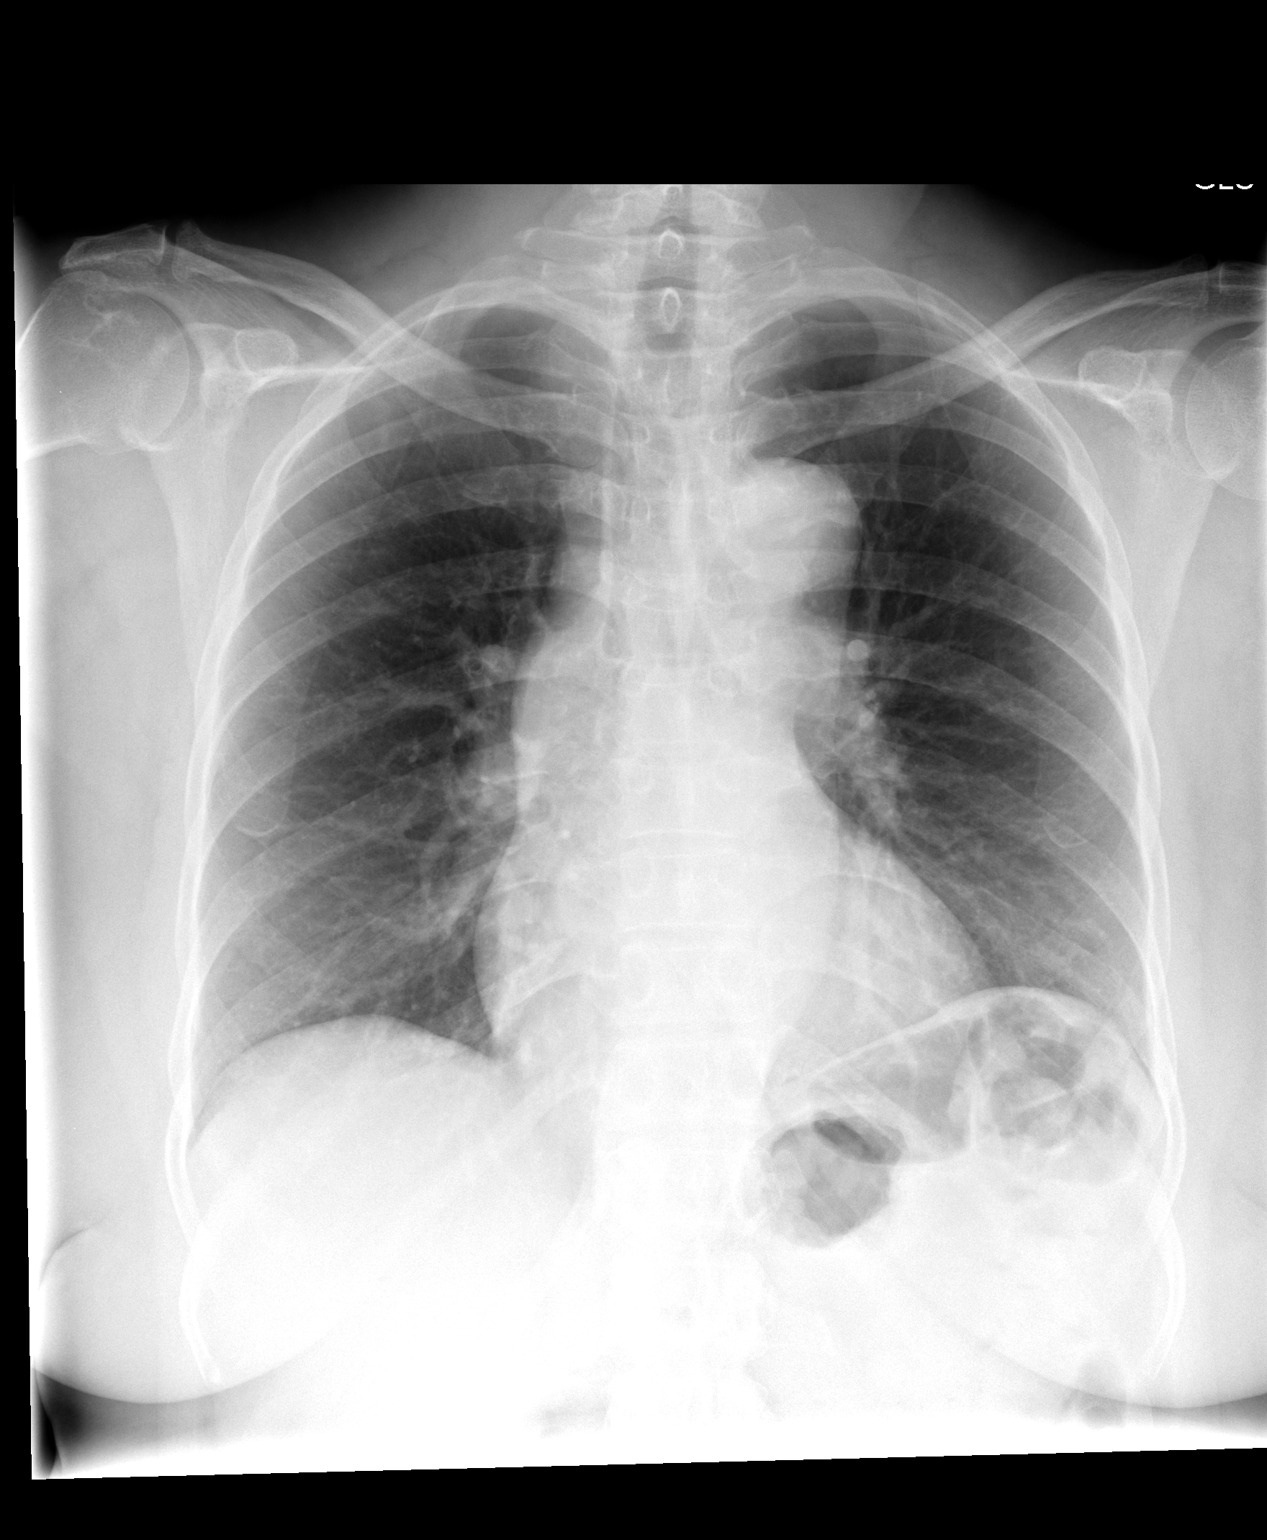

[view not recorded (2 of 2)]
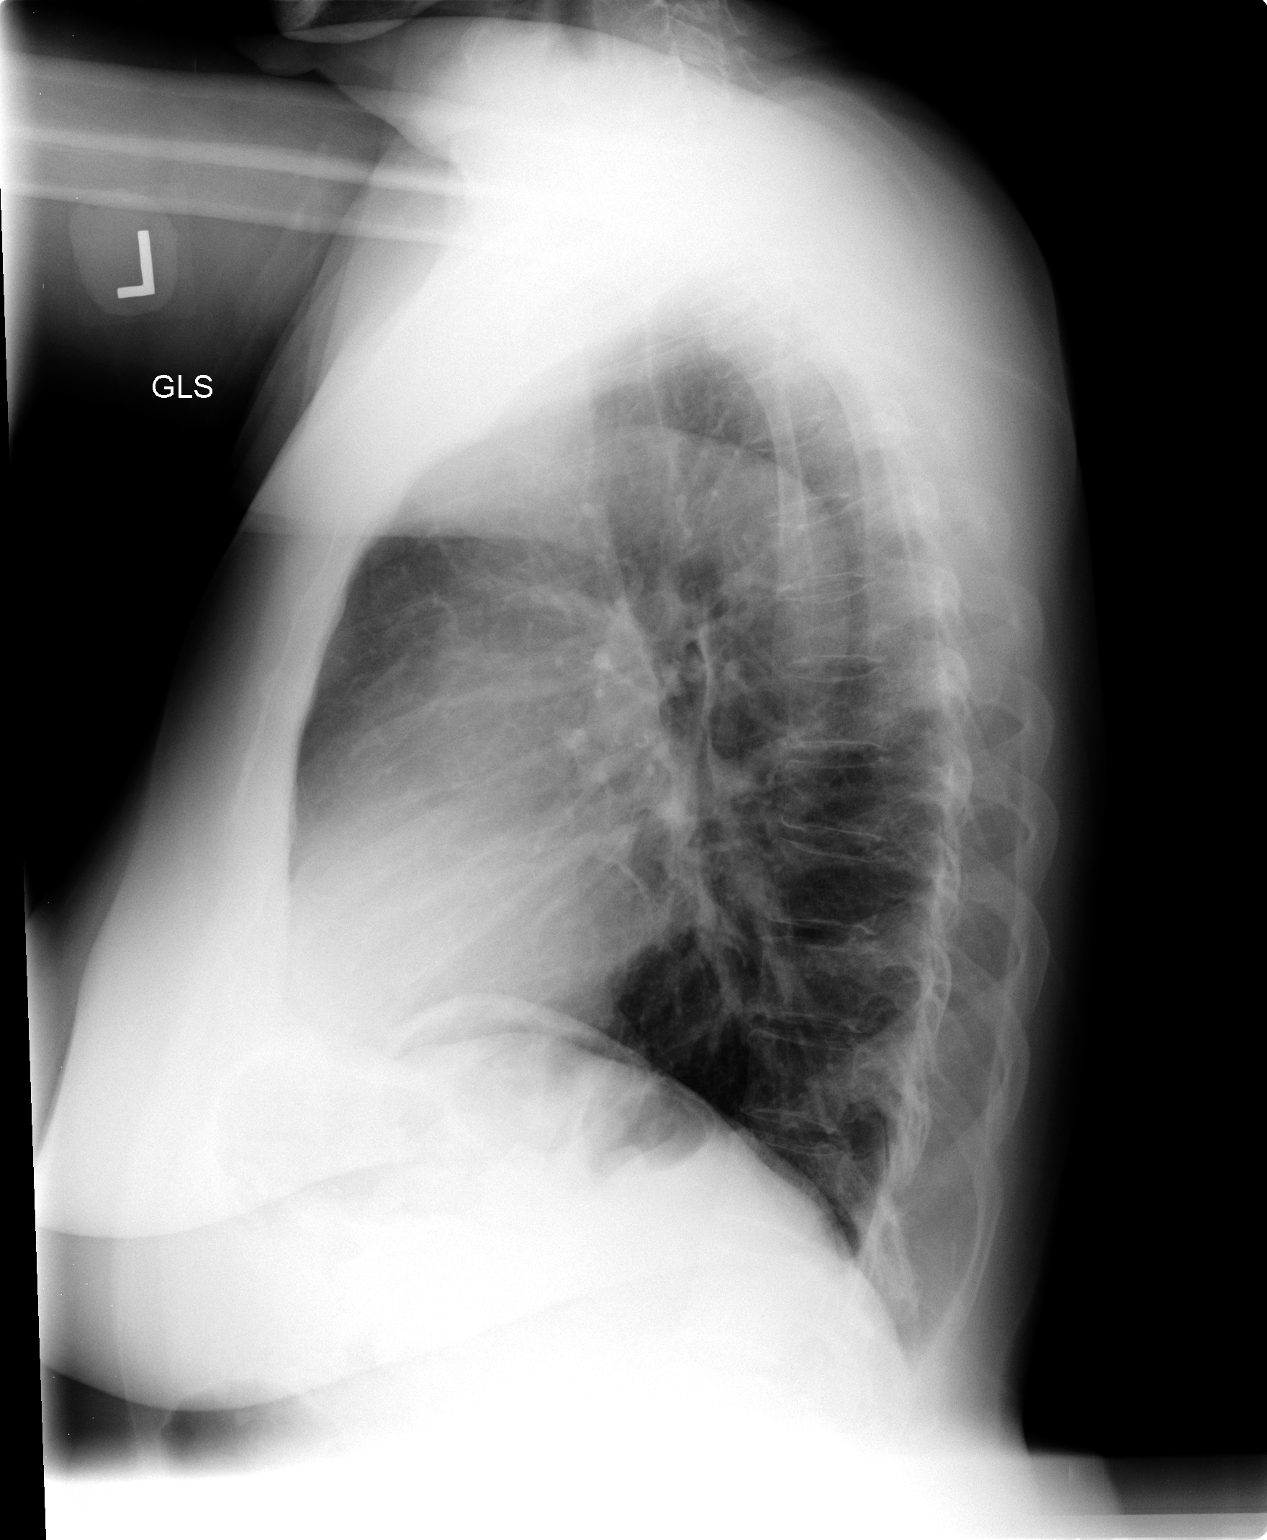

[2 of 2 positions shown; findings below may reference images not displayed]

FINDINGS: Cardiomediastinal silhouette is stable.  No acute
infiltrate or pleural effusion.  No pulmonary edema.  Bony thorax
is stable.
IMPRESSION: No active disease.  No significant change.

## 2012-09-17 MED ORDER — HYDROCODONE-ACETAMINOPHEN 5-325 MG PO TABS
1.0000 | ORAL_TABLET | Freq: Once | ORAL | Status: AC
  Administered 2012-09-17: 1 via ORAL
  Filled 2012-09-17: qty 1

## 2012-09-17 MED ORDER — SODIUM CHLORIDE 0.9 % IV SOLN: INTRAVENOUS | Status: DC

## 2012-09-17 MED ORDER — TRAMADOL HCL 50 MG PO TABS: 50.0000 mg | ORAL_TABLET | Freq: Four times a day (QID) | ORAL | Status: DC | PRN

## 2012-09-17 MED ORDER — SODIUM CHLORIDE 0.9 % IV BOLUS (SEPSIS)
1000.0000 mL | Freq: Once | INTRAVENOUS | Status: AC
  Administered 2012-09-17: 1000 mL via INTRAVENOUS

## 2012-09-17 NOTE — ED Notes (Signed)
Patient c/o generalized body aches, headache, and productive cough with thick green sputum. Denies any fevers.

## 2012-09-17 NOTE — ED Provider Notes (Signed)
History  This chart was scribed for Shelda Jakes, MD by Bennett Scrape, ED Scribe. This patient was seen in room APA04/APA04 and the patient's care was started at 11:41 AM.  CSN: [id number]  Arrival date & time 09/17/12  1019   First MD Initiated Contact with Patient 09/17/12 1141      Chief Complaint  Patient presents with  . Generalized Body Aches  . Headache  . Cough     The history is provided by the patient. No language interpreter was used.    Jennifer Rubio is a 61 y.o. female who presents to the Emergency Department complaining of 3 to 4 days of gradual onset, gradually worsening, constant generalized myalgias with associated generalized HA, cough productive of phlegm, nasal congestion, and sore throat. The sore throat is worse with swallowing and the myalgias are worse with coughing. She denies taking OTC medications at home to improve symptoms. She reports that her daughter is sick with similar symptoms at home. She denies fever, CP, nausea and diarrhea as associated symptoms. She has a h/o HTN and OA and she denies smoking and alcohol use. She denies having a flu vaccine this year.  PCP is Dr. Jamesetta Orleans in [place], [place].  Past Medical History  Diagnosis Date  . Hypertension   . Osteoarthritis   . Cellulitis and abscess of leg January 2013  . Urinary tract infection January 2013    Past Surgical History  Procedure Date  . Abdominal hysterectomy   . Knee arthroscopy     both knees  . Robotic assisted laparoscopic sacrocolpopexy 11/26/2011    Procedure: ROBOTIC ASSISTED LAPAROSCOPIC SACROCOLPOPEXY;  Surgeon: Genia Del, MD;  Location: [facility];  Service: Gynecology;  Laterality: N/A;  . Bladder suspension 11/26/2011    Procedure: TRANSVAGINAL TAPE (TVT) PROCEDURE;  Surgeon: Genia Del, MD;  Location: [facility];  Service: Gynecology;  Laterality: N/A;  . Cystoscopy 11/26/2011    Procedure: CYSTOSCOPY;  Surgeon: Genia Del, MD;  Location: [facility];  Service:  Gynecology;  Laterality: N/A;  . Rectocele repair 11/26/2011    Procedure: POSTERIOR REPAIR (RECTOCELE);  Surgeon: Genia Del, MD;  Location: [facility];  Service: Gynecology;  Laterality: N/A;    Family History  Problem Relation Age of Onset  . Hypertension Father   . Diabetes Sister   . Diabetes Brother     History  Substance Use Topics  . Smoking status: Never Smoker   . Smokeless tobacco: Not on file  . Alcohol Use: No    OB History    Grav Para Term Preterm Abortions TAB SAB Ect Mult Living   2 2 2       2       Review of Systems  HENT: Positive for congestion and sore throat. Negative for neck pain.   Respiratory: Positive for cough. Negative for shortness of breath.   Cardiovascular: Negative for chest pain.  Gastrointestinal: Negative for nausea, vomiting, abdominal pain and diarrhea.  Genitourinary: Negative for dysuria and hematuria.  Musculoskeletal: Positive for myalgias.  Skin: Negative for rash.  Neurological: Positive for headaches. Negative for weakness.  All other systems reviewed and are negative.    Allergies  Sulfa antibiotics  Home Medications   Current Outpatient Rx  Name  Route  Sig  Dispense  Refill  . AMLODIPINE BESYLATE 5 MG PO TABS   Oral   Take 5 mg by mouth every morning.         Marland Kitchen LISINOPRIL-HYDROCHLOROTHIAZIDE 20-12.5 MG PO TABS  Oral   Take 1 tablet by mouth every morning.         Marland Kitchen TRAMADOL HCL 50 MG PO TABS   Oral   Take 1 tablet (50 mg total) by mouth every 6 (six) hours as needed for pain.   15 tablet   0     Triage Vitals: BP 112/72  Pulse 85  Temp 98 F (36.7 C) (Oral)  Resp 16  Ht 5\' 2"  (1.575 m)  Wt 153 lb (69.4 kg)  BMI 27.98 kg/m2  SpO2 100%  Physical Exam  Nursing note and vitals reviewed. Constitutional: She is oriented to person, place, and time. She appears well-developed and well-nourished. No distress.  HENT:  Head: Normocephalic and atraumatic.       Moist mucous membranes, mildly  erythematous oropharynx, no exudate   Eyes: Conjunctivae normal and EOM are normal. Pupils are equal, round, and reactive to light. No scleral icterus.       Eyes track well  Neck: Neck supple. No tracheal deviation present.  Cardiovascular: Normal rate and regular rhythm.   No murmur heard. Pulmonary/Chest: Effort normal and breath sounds normal. No respiratory distress.  Abdominal: Soft. Bowel sounds are normal. There is no tenderness.  Musculoskeletal: Normal range of motion. She exhibits no edema.  Lymphadenopathy:    She has no cervical adenopathy.  Neurological: She is alert and oriented to person, place, and time. No cranial nerve deficit.       Pt able to move both sets of fingers and toes  Skin: Skin is warm and dry.  Psychiatric: She has a normal mood and affect. Her behavior is normal.    ED Course  Procedures (including critical care time)  DIAGNOSTIC STUDIES: Oxygen Saturation is 100% on room air, normal by my interpretation.    COORDINATION OF CARE: 11:55 AM- Discussed treatment plan which includes CXR and IV fluids with pt at bedside and pt agreed to plan.  12:45 PM- Ordered 1,000 mL of Bolus   Labs Reviewed  CBC WITH DIFFERENTIAL - Abnormal; Notable for the following:    Hemoglobin 11.9 (*)     Monocytes Relative 13 (*)     All other components within normal limits  BASIC METABOLIC PANEL - Abnormal; Notable for the following:    GFR calc non Af Amer 89 (*)     All other components within normal limits   Dg Chest 2 View  09/17/2012  *RADIOLOGY REPORT*  Clinical Data: Generalized body ache, cough  CHEST - 2 VIEW  Comparison: 01/21/2012  Findings: Cardiomediastinal silhouette is stable.  No acute infiltrate or pleural effusion.  No pulmonary edema.  Bony thorax is stable.  IMPRESSION: No active disease.  No significant change.   Original Report Authenticated By: Natasha Mead, M.D.    Results for orders placed during the hospital encounter of 09/17/12  CBC WITH  DIFFERENTIAL      Component Value Range   WBC 4.5  4.0 - 10.5 K/uL   RBC 4.25  3.87 - 5.11 MIL/uL   Hemoglobin 11.9 (*) 12.0 - 15.0 g/dL   HCT 16.1  09.6 - 04.5 %   MCV 84.7  78.0 - 100.0 fL   MCH 28.0  26.0 - 34.0 pg   MCHC 33.1  30.0 - 36.0 g/dL   RDW 40.9  81.1 - 91.4 %   Platelets 221  150 - 400 K/uL   Neutrophils Relative 60  43 - 77 %   Neutro Abs 2.7  1.7 -  7.7 K/uL   Lymphocytes Relative 27  12 - 46 %   Lymphs Abs 1.2  0.7 - 4.0 K/uL   Monocytes Relative 13 (*) 3 - 12 %   Monocytes Absolute 0.6  0.1 - 1.0 K/uL   Eosinophils Relative 1  0 - 5 %   Eosinophils Absolute 0.0  0.0 - 0.7 K/uL   Basophils Relative 0  0 - 1 %   Basophils Absolute 0.0  0.0 - 0.1 K/uL  BASIC METABOLIC PANEL      Component Value Range   Sodium 136  135 - 145 mEq/L   Potassium 3.6  3.5 - 5.1 mEq/L   Chloride 101  96 - 112 mEq/L   CO2 26  19 - 32 mEq/L   Glucose, Bld 85  70 - 99 mg/dL   BUN 13  6 - 23 mg/dL   Creatinine, Ser 1.61  0.50 - 1.10 mg/dL   Calcium 9.0  8.4 - 09.6 mg/dL   GFR calc non Af Amer 89 (*) >90 mL/min   GFR calc Af Amer >90  >90 mL/min     1. Influenza       MDM  The symptoms seem to be flulike in nature. Patient's had symptoms for greater than 2 days. No signs of pneumonia. Patient nontoxic no acute distress. Improves some with fluid hydration in the emergency department. Will treat with tramadol for the bodyaches and Mucinex DM if the cough gets worse.   I personally performed the services described in this documentation, which was scribed in my presence. The recorded information has been reviewed and is accurate.        Shelda Jakes, MD 09/17/12 [phone]

## 2012-09-17 NOTE — ED Notes (Signed)
Called to room due to IV not running. Flushed IV with 10 mls Normal saline. IV infusing without difficulty.

## 2013-09-10 ENCOUNTER — Ambulatory Visit: Payer: Self-pay

## 2013-09-10 IMAGING — MG MM DIGITAL SCREENING BILAT W/ CAD
1 series · 6 of 6 positions shown · non-contrast
Comparison: Previous exam(s).

CLINICAL DATA: Screening.

EXAM:
DIGITAL SCREENING BILATERAL MAMMOGRAM WITH CAD

[R CC · right · 6 of 6 slices shown]
[im 1/6]
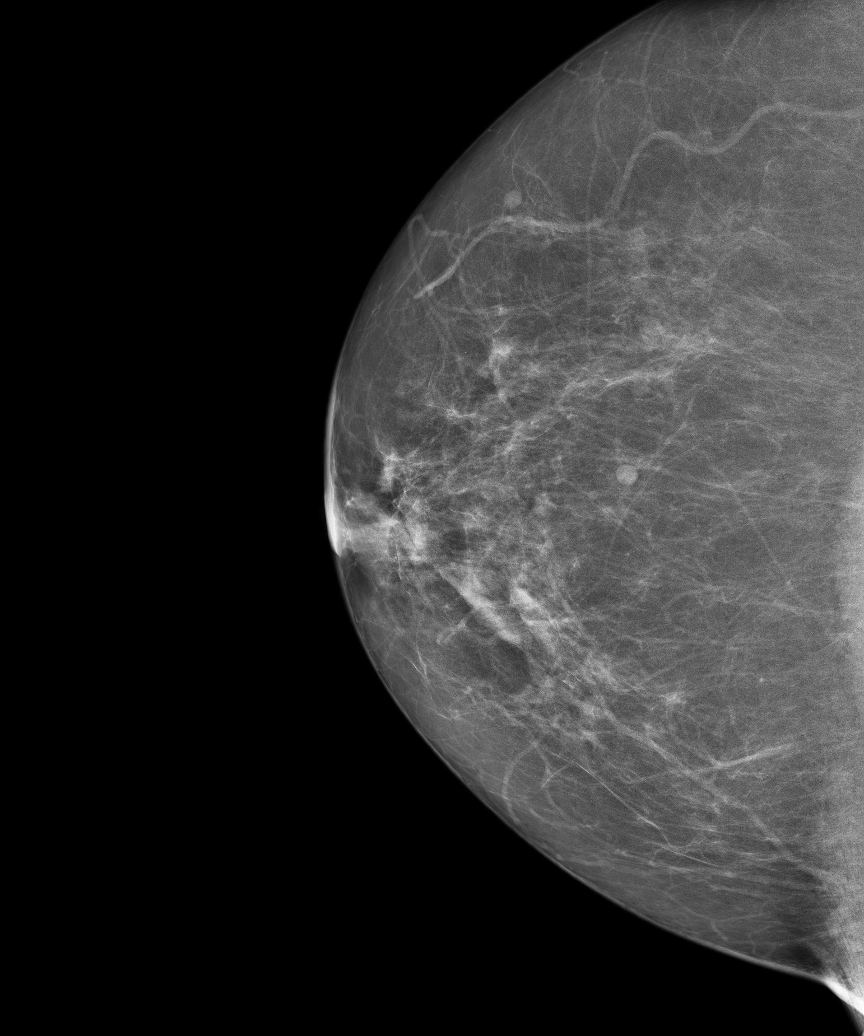
[im 2/6]
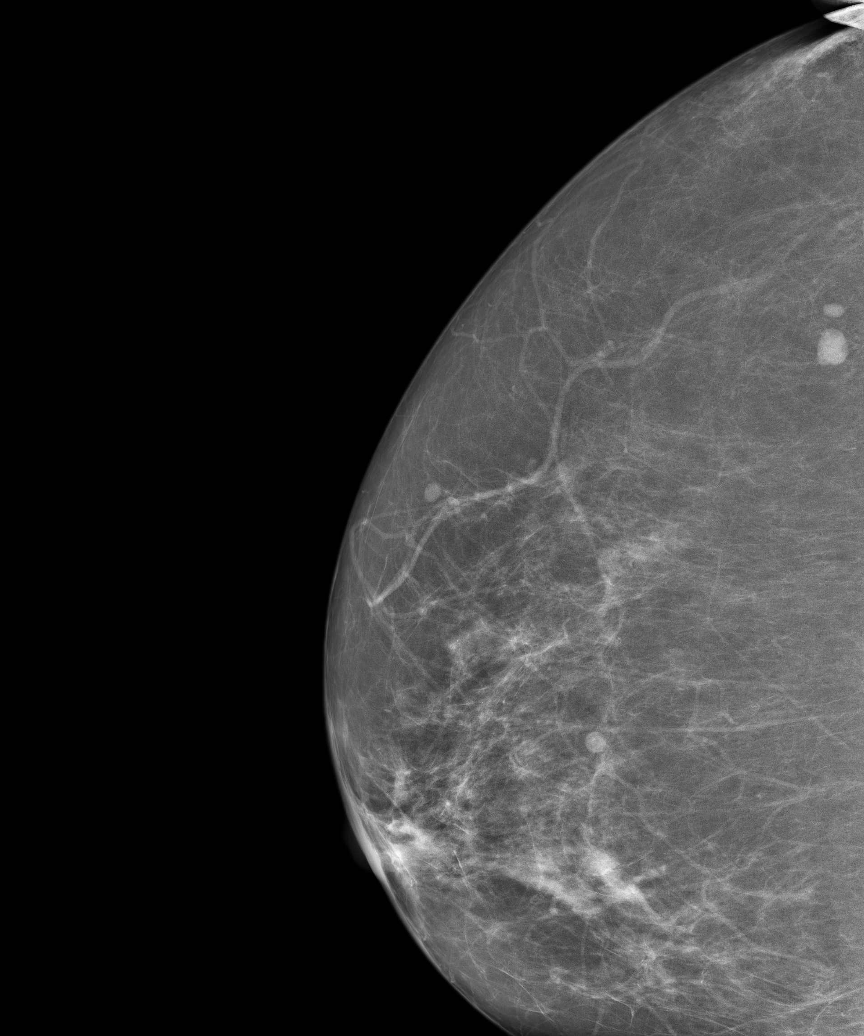
[im 3/6]
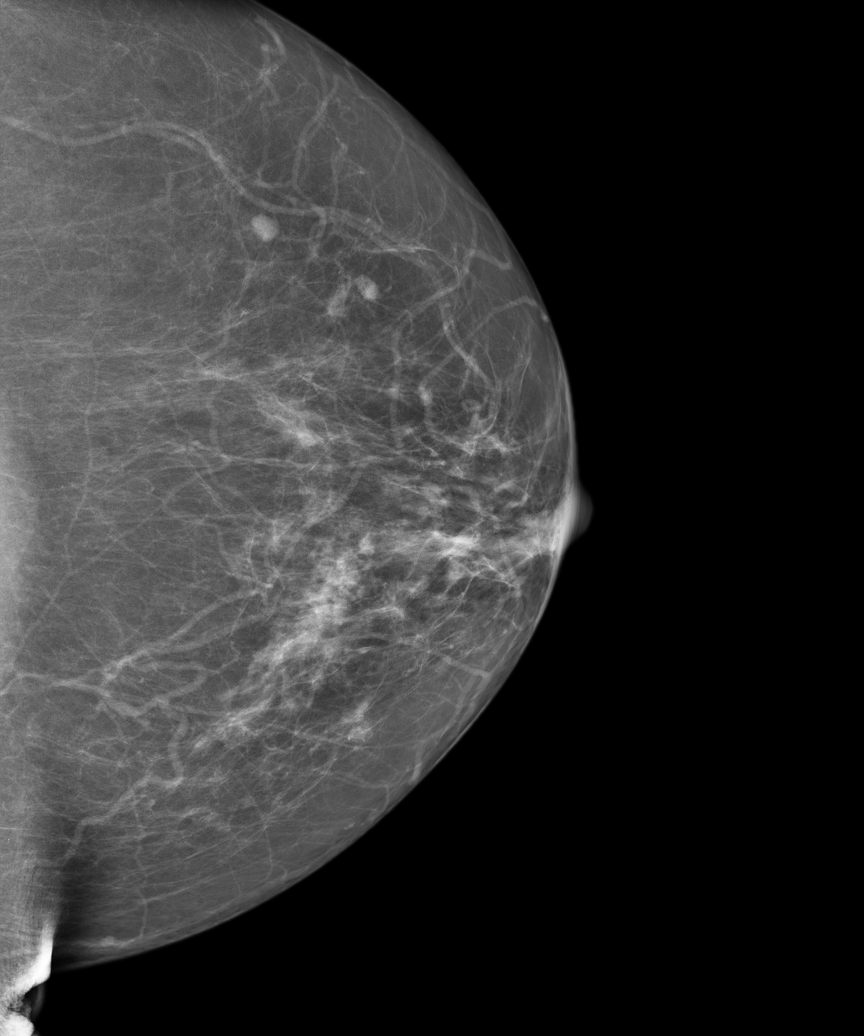
[im 4/6]
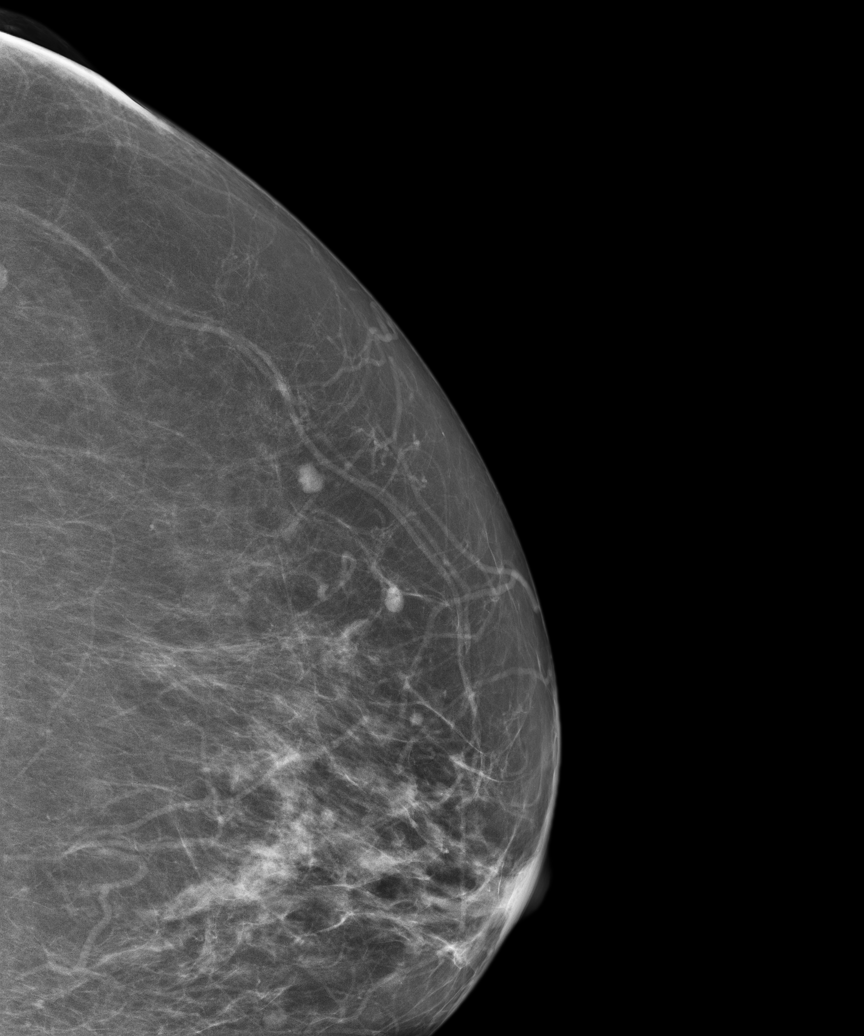
[im 5/6]
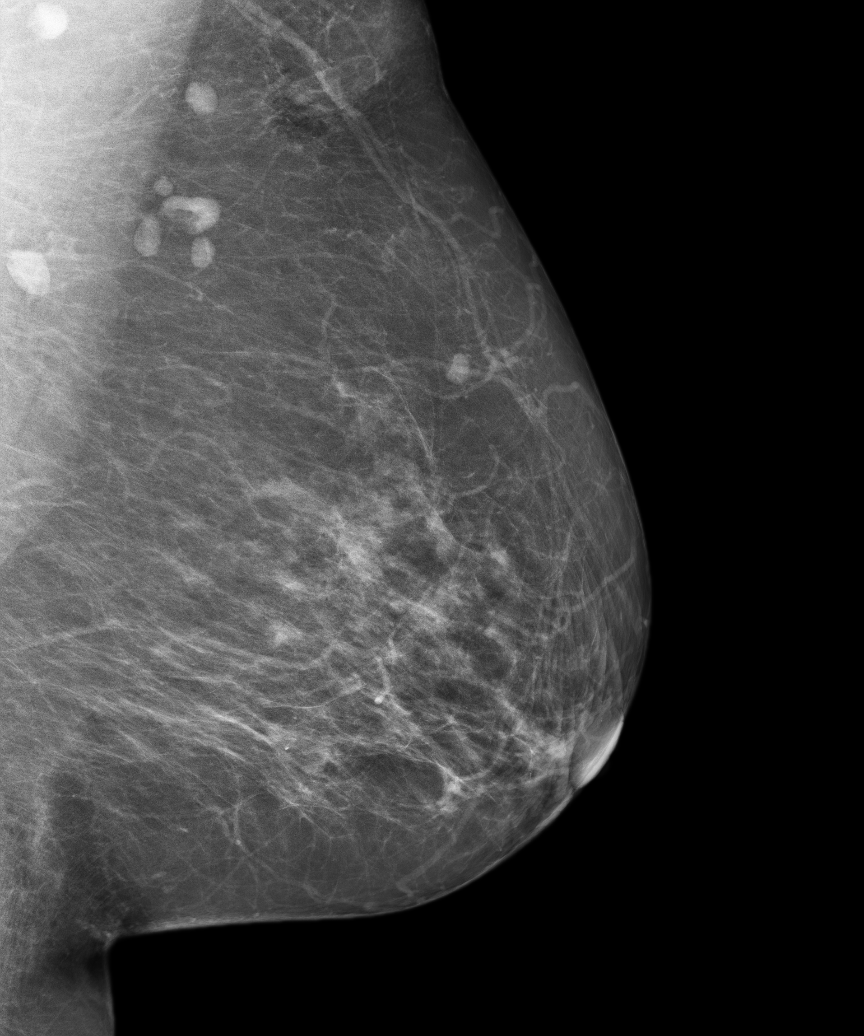
[im 6/6]
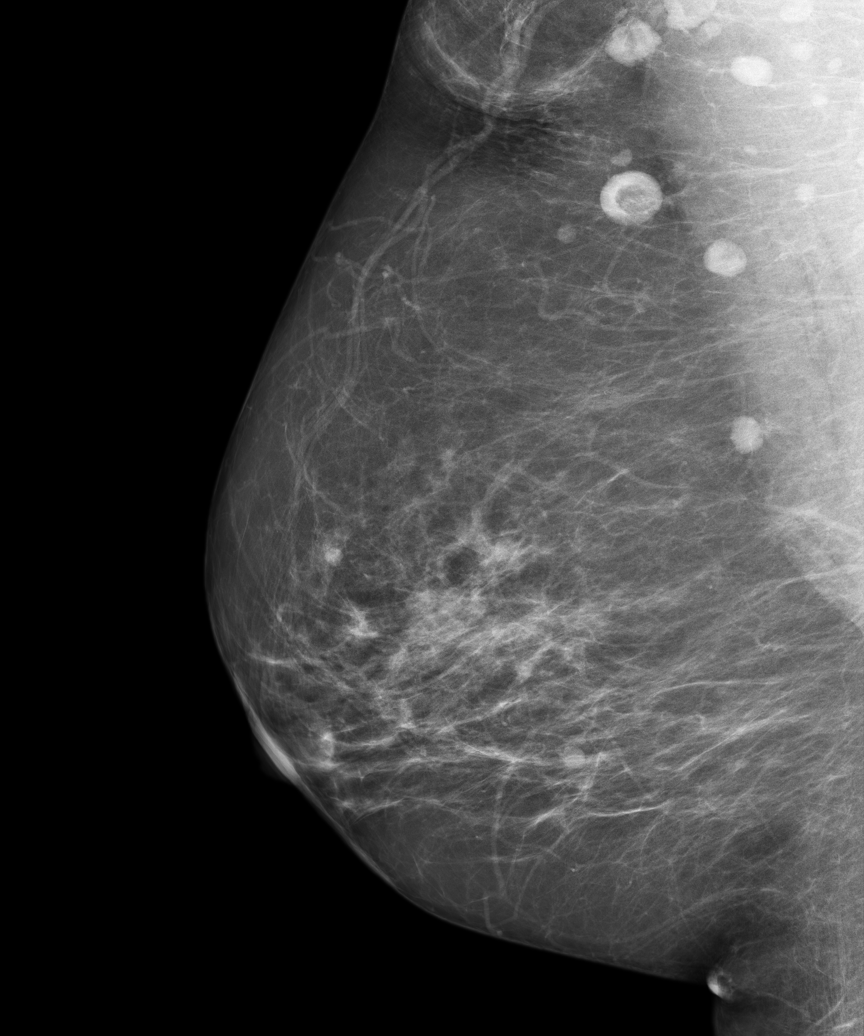

[6 of 6 positions shown; findings below may reference images not displayed]

ACR Breast Density Category b: There are scattered areas of
fibroglandular density.
FINDINGS: There are no findings suspicious for malignancy. Images were
processed with CAD.
IMPRESSION: No mammographic evidence of malignancy. A result letter of this
screening mammogram will be mailed directly to the patient.

RECOMMENDATION:
Screening mammogram in one year. (Code:[HN])

BI-RADS CATEGORY  1: Negative

## 2013-10-14 ENCOUNTER — Ambulatory Visit: Payer: Self-pay | Admitting: Specialist

## 2013-10-14 LAB — URINALYSIS, COMPLETE
Bilirubin,UR: NEGATIVE
GLUCOSE, UR: NEGATIVE mg/dL (ref 0–75)
Ketone: NEGATIVE
NITRITE: POSITIVE
Ph: 5 (ref 4.5–8.0)
Protein: NEGATIVE
RBC,UR: 7 /HPF (ref 0–5)
SPECIFIC GRAVITY: 1.024 (ref 1.003–1.030)
Squamous Epithelial: 12
WBC UR: 26 /HPF (ref 0–5)

## 2013-10-14 LAB — CBC
HCT: 37.5 % (ref 35.0–47.0)
HGB: 12.4 g/dL (ref 12.0–16.0)
MCH: 28.1 pg (ref 26.0–34.0)
MCHC: 33.1 g/dL (ref 32.0–36.0)
MCV: 85 fL (ref 80–100)
Platelet: 296 10*3/uL (ref 150–440)
RBC: 4.4 10*6/uL (ref 3.80–5.20)
RDW: 14.2 % (ref 11.5–14.5)
WBC: 6.7 10*3/uL (ref 3.6–11.0)

## 2013-10-14 LAB — BASIC METABOLIC PANEL
Anion Gap: 6 — ABNORMAL LOW (ref 7–16)
BUN: 18 mg/dL (ref 7–18)
CHLORIDE: 105 mmol/L (ref 98–107)
CO2: 26 mmol/L (ref 21–32)
CREATININE: 0.97 mg/dL (ref 0.60–1.30)
Calcium, Total: 9 mg/dL (ref 8.5–10.1)
EGFR (African American): >60
EGFR (Non-African Amer.): >60
GLUCOSE: 111 mg/dL — AB (ref 65–99)
Osmolality: 276 (ref 275–301)
POTASSIUM: 3.3 mmol/L — AB (ref 3.5–5.1)
SODIUM: 137 mmol/L (ref 136–145)

## 2013-10-14 LAB — MRSA PCR SCREENING

## 2013-10-14 LAB — APTT: Activated PTT: 29.9 secs (ref 23.6–35.9)

## 2013-10-14 LAB — PROTIME-INR
INR: 1
Prothrombin Time: 13.1 secs (ref 11.5–14.7)

## 2013-10-20 ENCOUNTER — Inpatient Hospital Stay: Payer: Self-pay | Admitting: Specialist

## 2013-10-20 LAB — CREATININE, SERUM
CREATININE: 0.83 mg/dL (ref 0.60–1.30)
EGFR (African American): >60

## 2013-10-20 IMAGING — CR DG KNEE 1-2V*R*
1 series · 2 of 2 positions shown · non-contrast
Comparison: None.

CLINICAL DATA: Status post right knee replacement.

EXAM:
RIGHT KNEE - 1-2 VIEW

[Series 1: ap · 0.17mm/px · 2 of 2 slices shown]
[im 1/2]
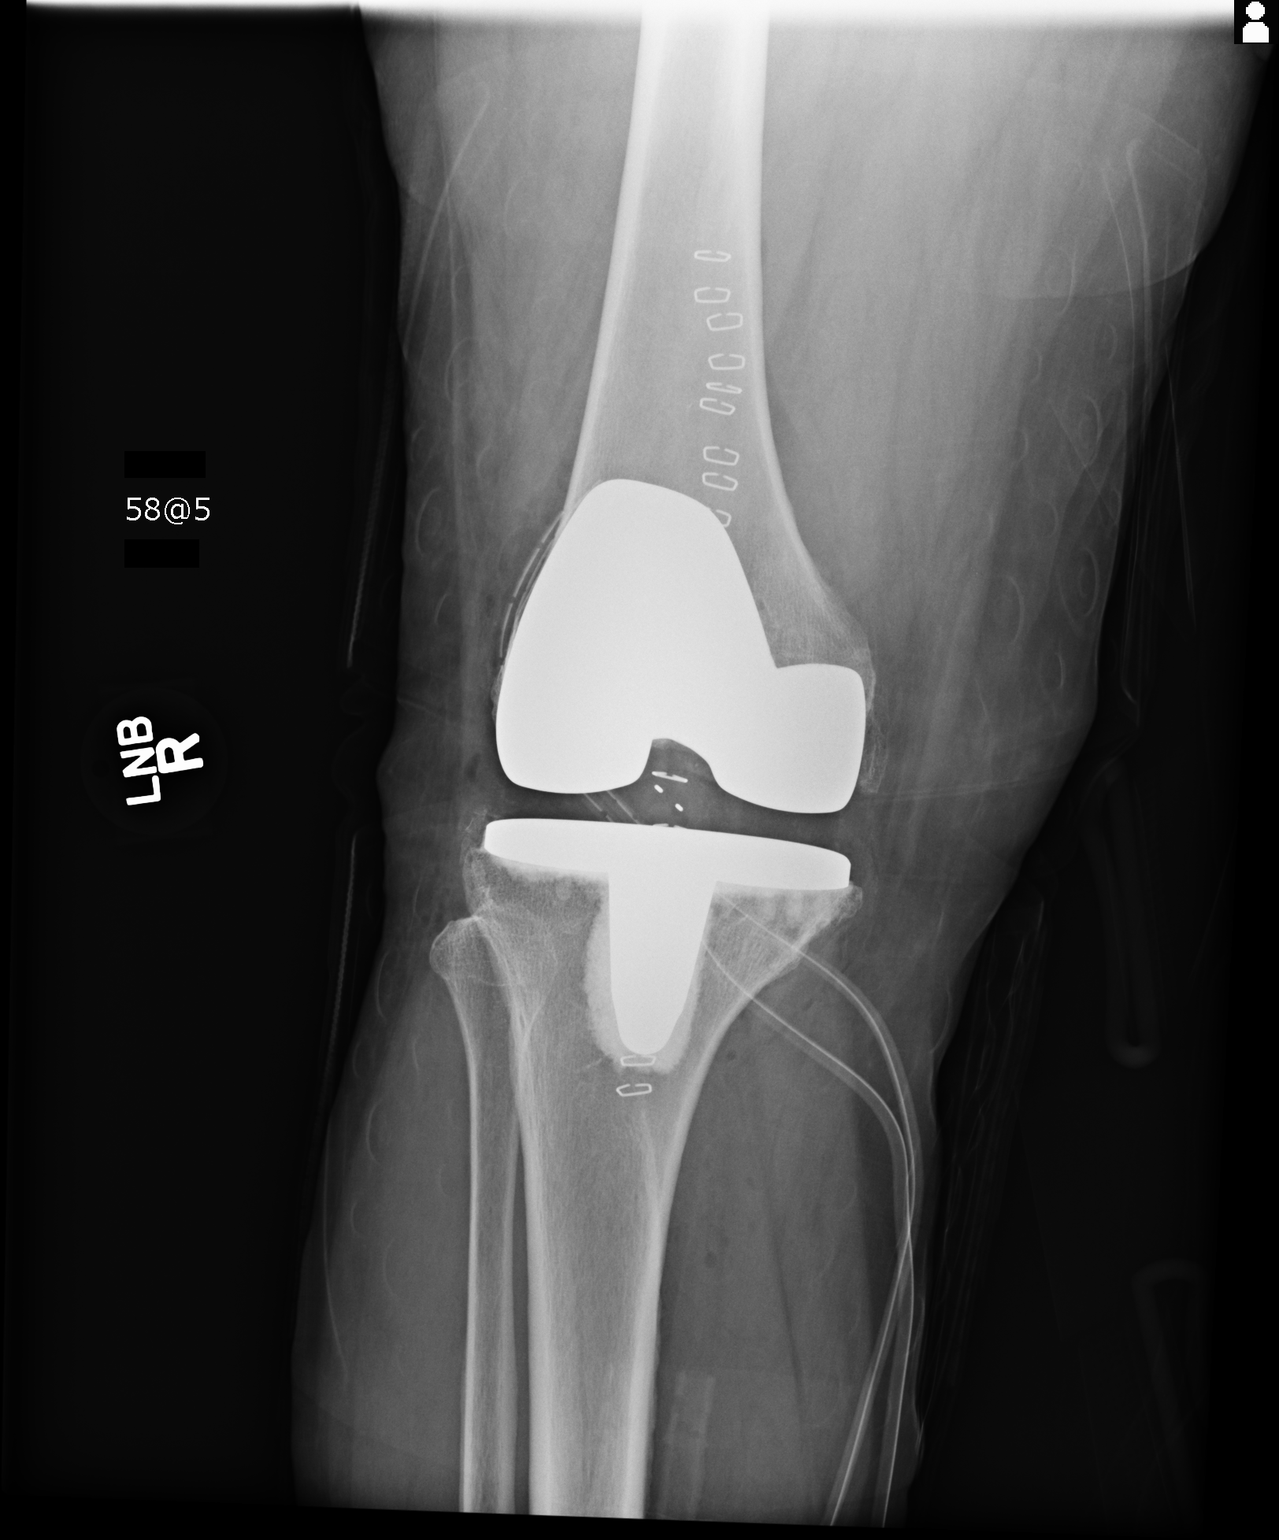
[im 2/2]
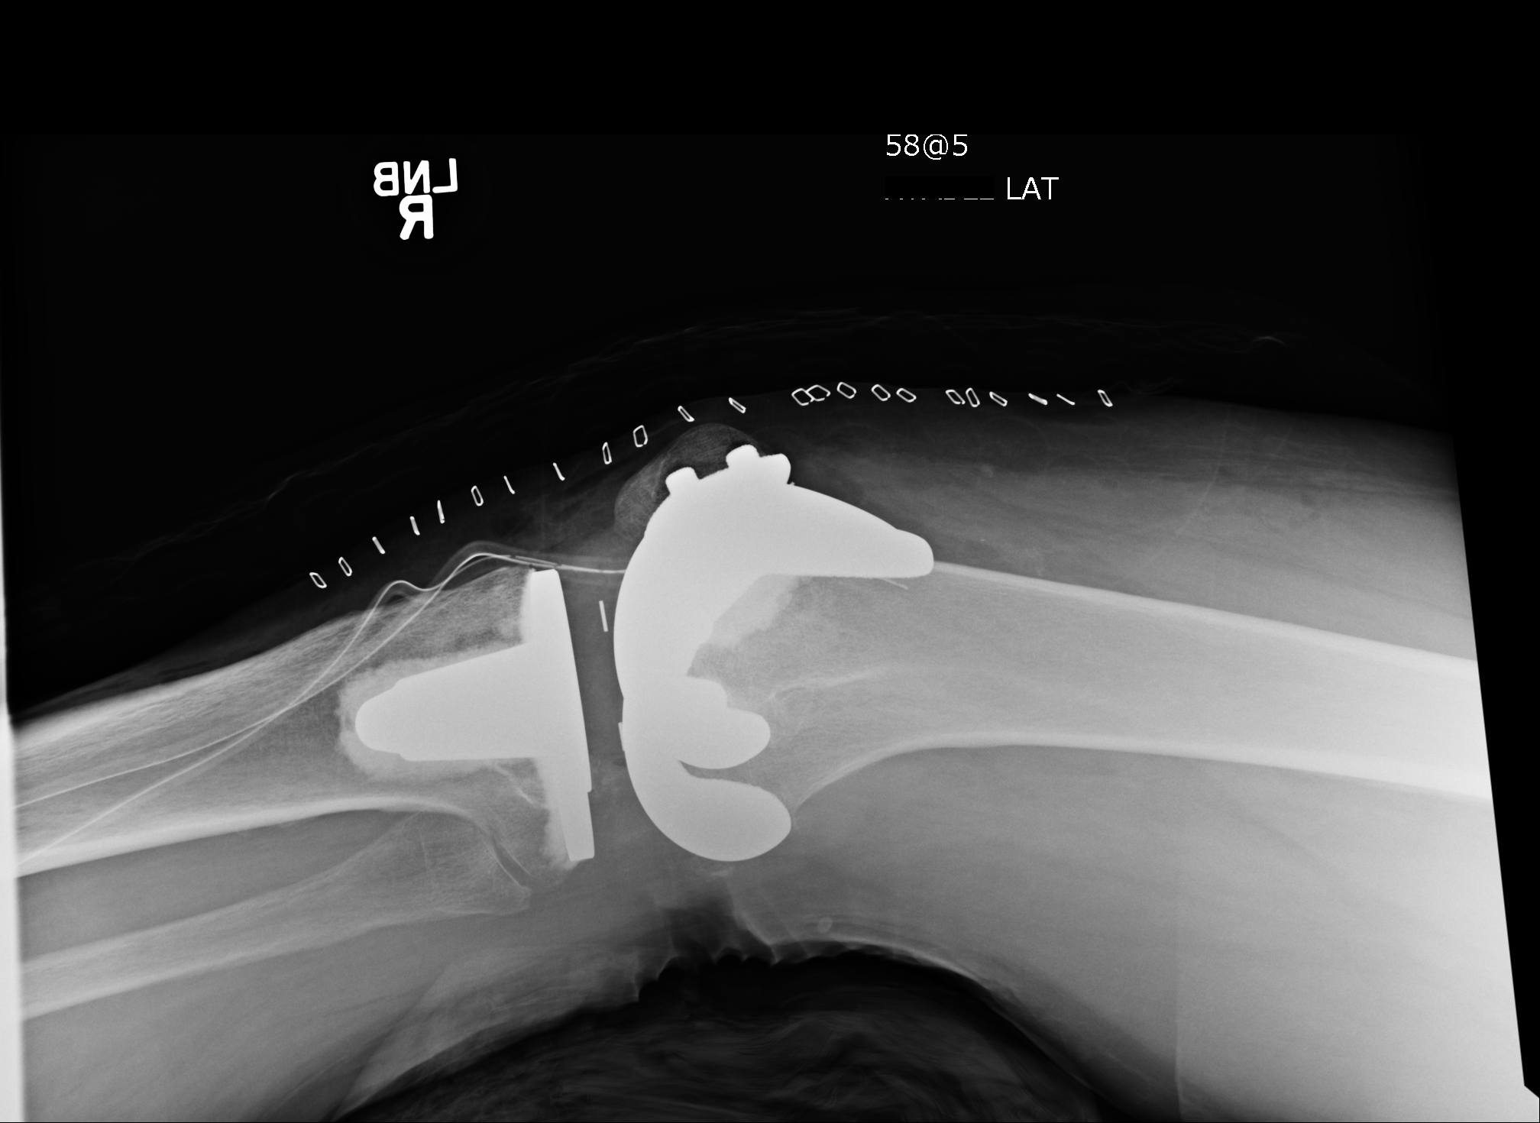

[2 of 2 positions shown; findings below may reference images not displayed]

FINDINGS: Right total knee arthroplasty is in place. Surgical drain and
staples are noted. Hardware is intact and appears well positioned.
There is no fracture.
IMPRESSION: Right total knee replacement without evidence of complication.

## 2013-10-21 LAB — HEMOGLOBIN: HGB: 11 g/dL — ABNORMAL LOW (ref 12.0–16.0)

## 2013-10-23 ENCOUNTER — Encounter: Payer: Self-pay | Admitting: Internal Medicine

## 2013-10-24 LAB — CREATININE, SERUM: Creatinine: 0.97 mg/dL (ref 0.60–1.30)

## 2013-10-25 ENCOUNTER — Encounter: Payer: Self-pay | Admitting: Internal Medicine

## 2014-07-19 ENCOUNTER — Ambulatory Visit: Payer: Self-pay | Admitting: Specialist

## 2014-07-19 IMAGING — US US EXTREM LOW VENOUS*R*
1 series · 14 of 24 positions shown · non-contrast
Comparison: None.

CLINICAL DATA: Right leg swelling and pain.

EXAM:
RIGHT LOWER EXTREMITY VENOUS DOPPLER ULTRASOUND
TECHNIQUE: Gray-scale sonography with graded compression, as well as color
Doppler and duplex ultrasound, were performed to evaluate the deep
venous system from the level of the common femoral vein through the
popliteal and proximal calf veins. Spectral Doppler was utilized to
evaluate flow at rest and with distal augmentation maneuvers.

[Series 1: us extrem low venous*right* · 0.08mm/px · 49 acquisitions, 14 frames shown]
[im 1/49]
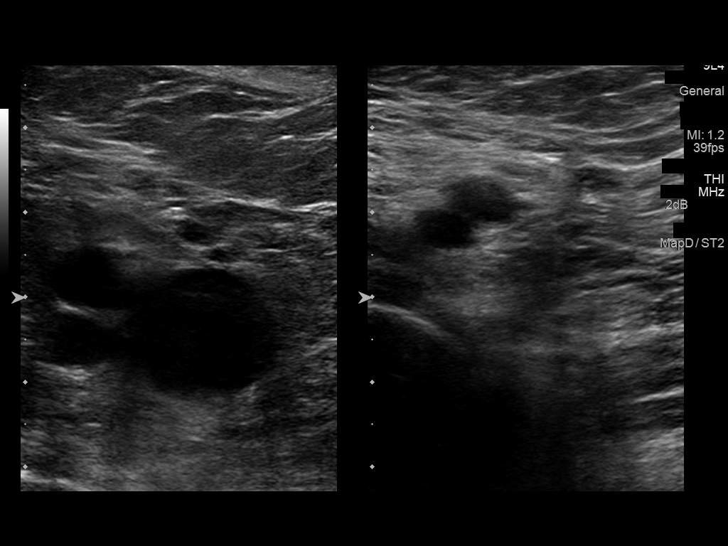
[im 5/49]
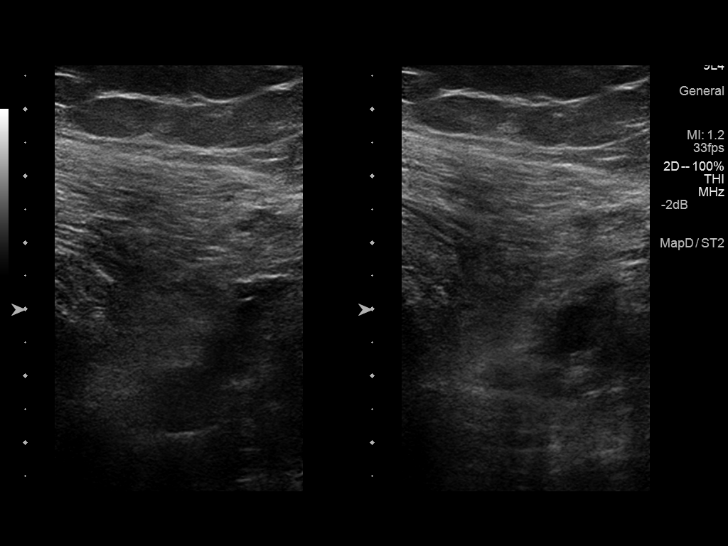
[im 9/49]
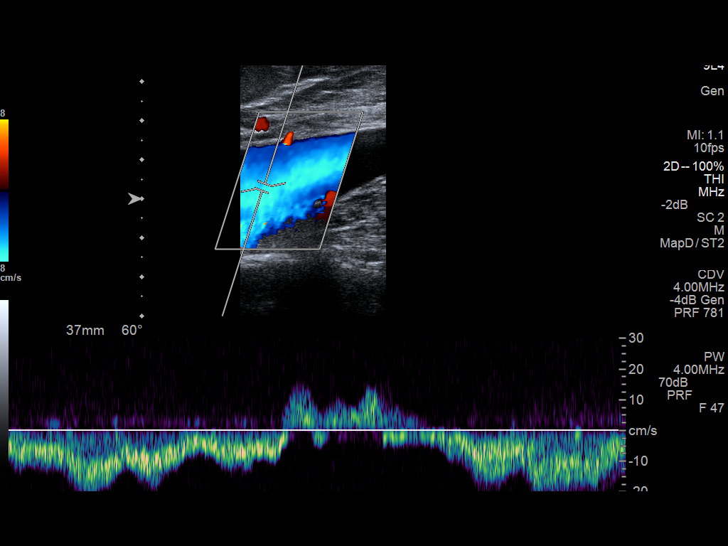
[im 13/49]
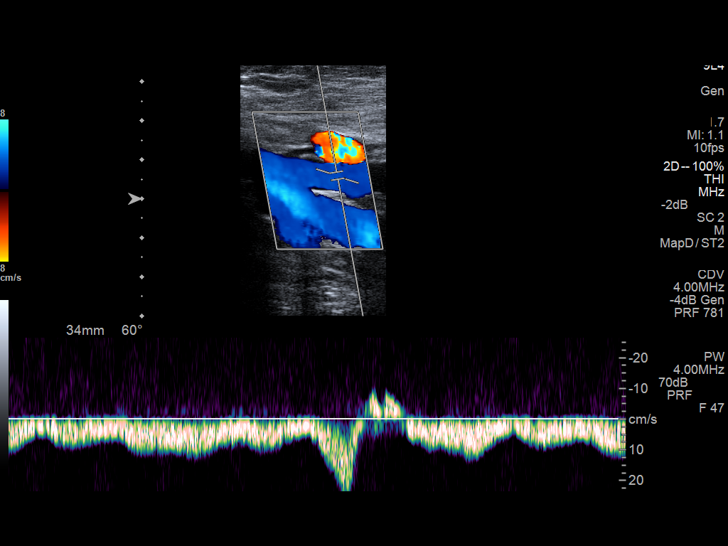
[im 15/49]
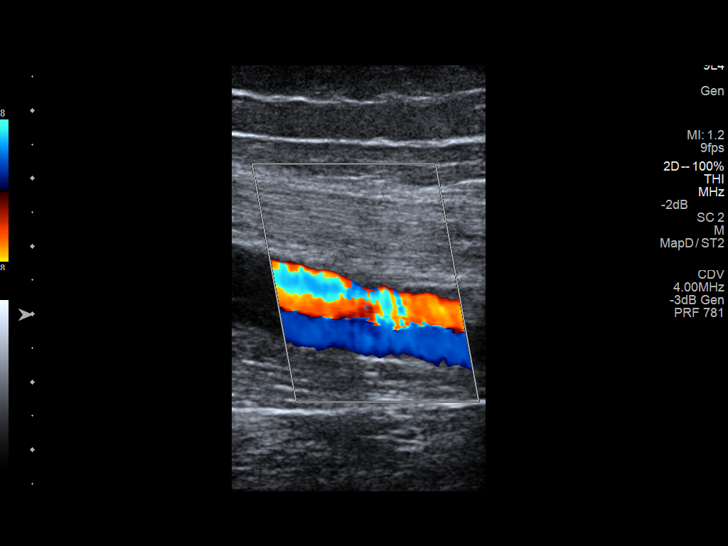
[im 19/49]
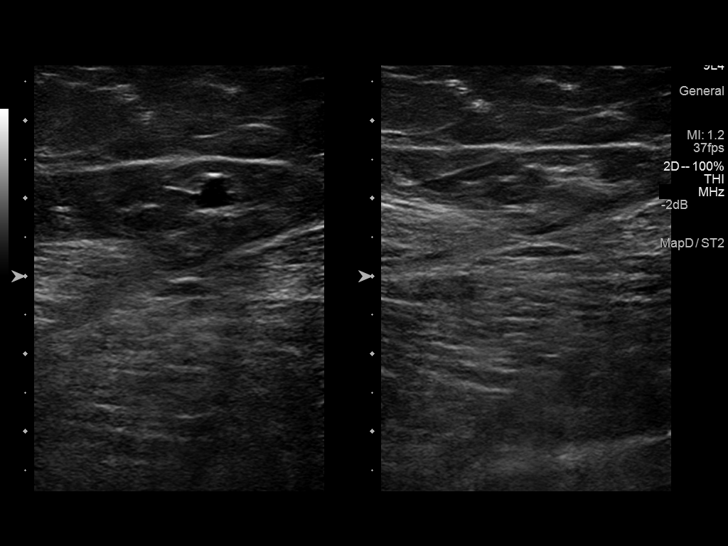
[im 23/49]
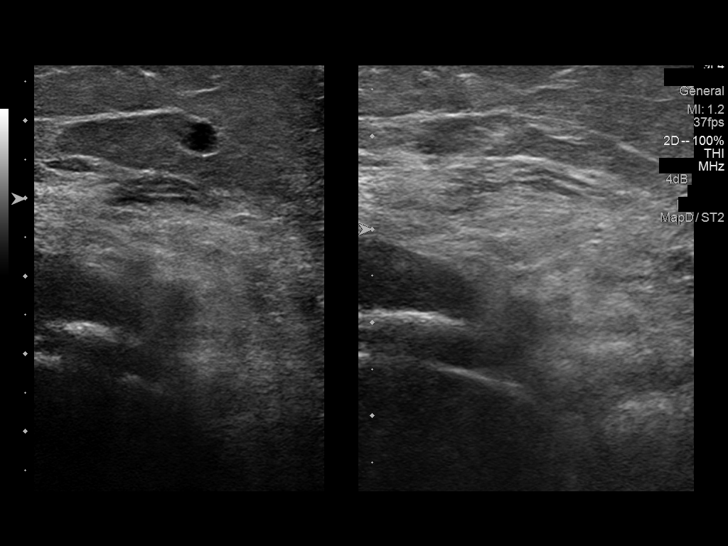
[im 28/49]
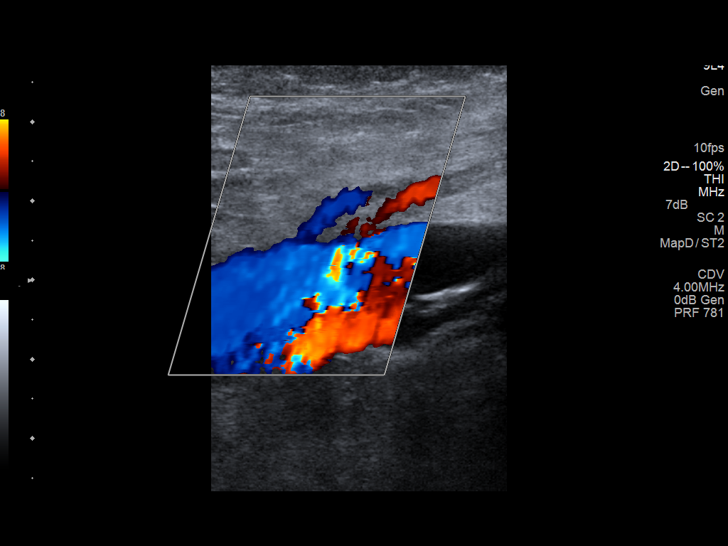
[im 32/49]
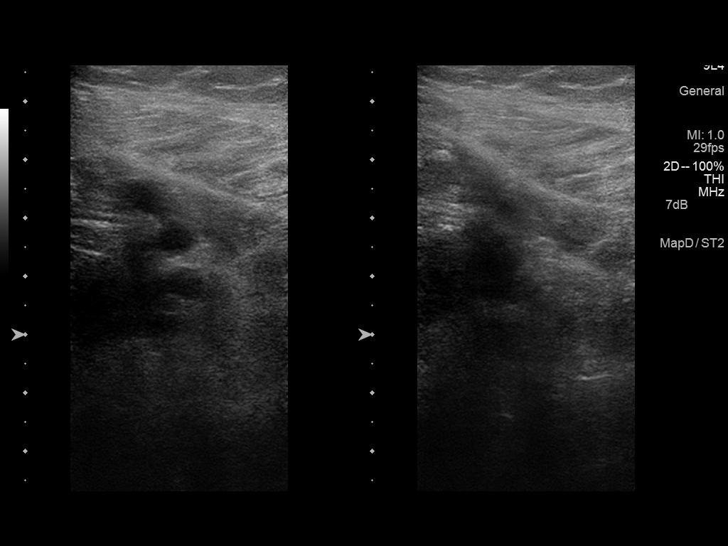
[im 36/49]
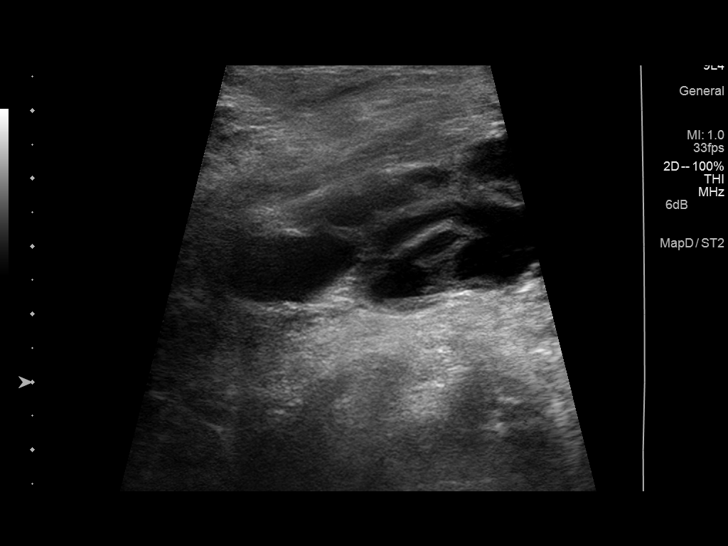
[im 40/49]
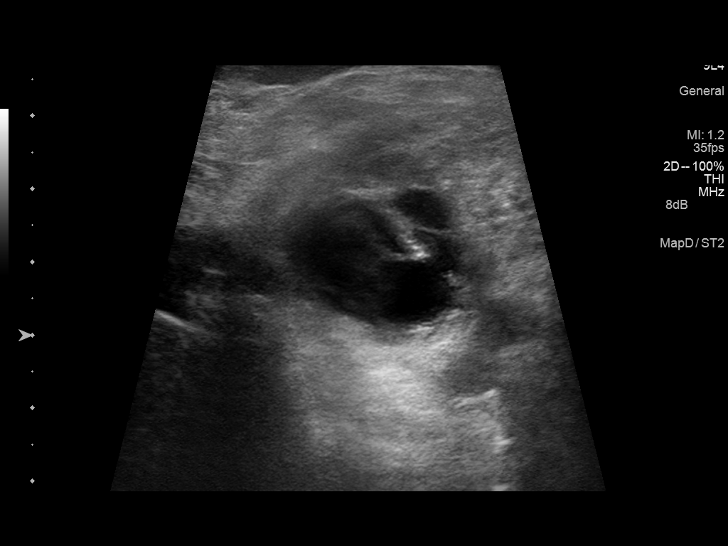
[im 42/49]
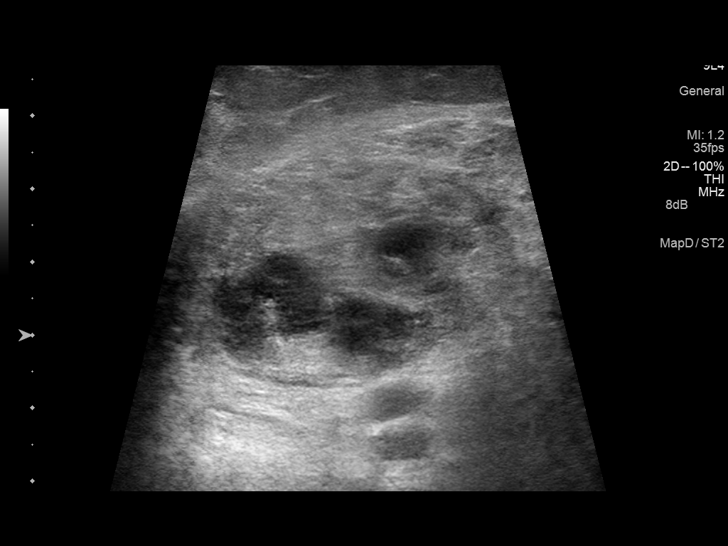
[im 44/49]
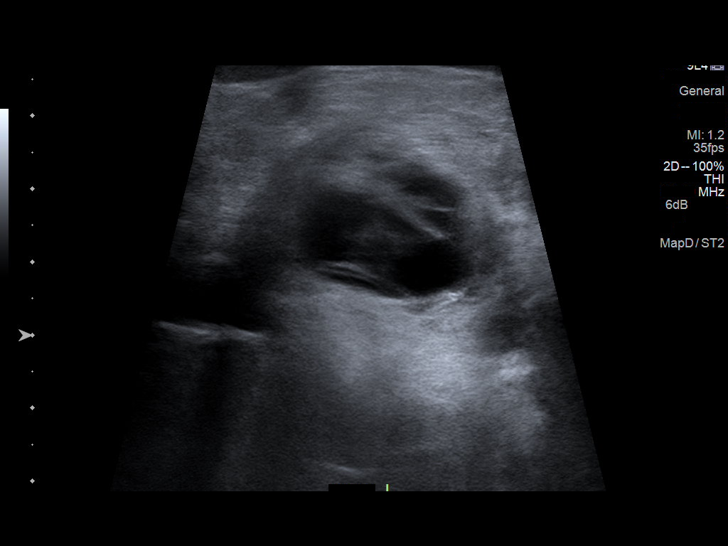
[im 49/49]
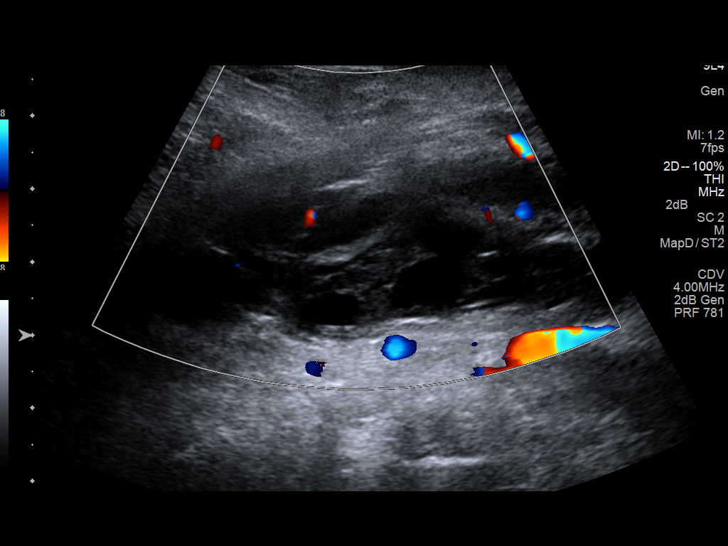

[14 of 24 positions shown; findings below may reference images not displayed]

FINDINGS: Normal compressibility, augmentation and color Doppler flow in the
right common femoral vein, right femoral vein and right popliteal
vein. Right great saphenous vein is patent. Visualized deep calf
veins are patent.

There is a complex hypoechoic structure in the right popliteal
fossa. This probably represents complex cystic collections. This
structure measures 7.9 x 2.8 x 2.0 cm. Collection extends into the
upper calf.
IMPRESSION: Negative for right lower extremity deep vein thrombosis.

Complex collection in the right popliteal fossa and right calf.
Findings could represent the sequelae of a ruptured Baker's cyst or
soft tissue injury.

## 2014-07-26 ENCOUNTER — Encounter (HOSPITAL_COMMUNITY): Payer: Self-pay | Admitting: Emergency Medicine

## 2014-10-04 ENCOUNTER — Ambulatory Visit: Payer: Self-pay

## 2014-10-04 IMAGING — MG MM DIGITAL SCREENING BILAT W/ CAD
4 series · 4 of 4 positions shown · non-contrast
Comparison: Previous exam(s).

CLINICAL DATA: Screening.

EXAM:
DIGITAL SCREENING BILATERAL MAMMOGRAM WITH CAD

[L CC]
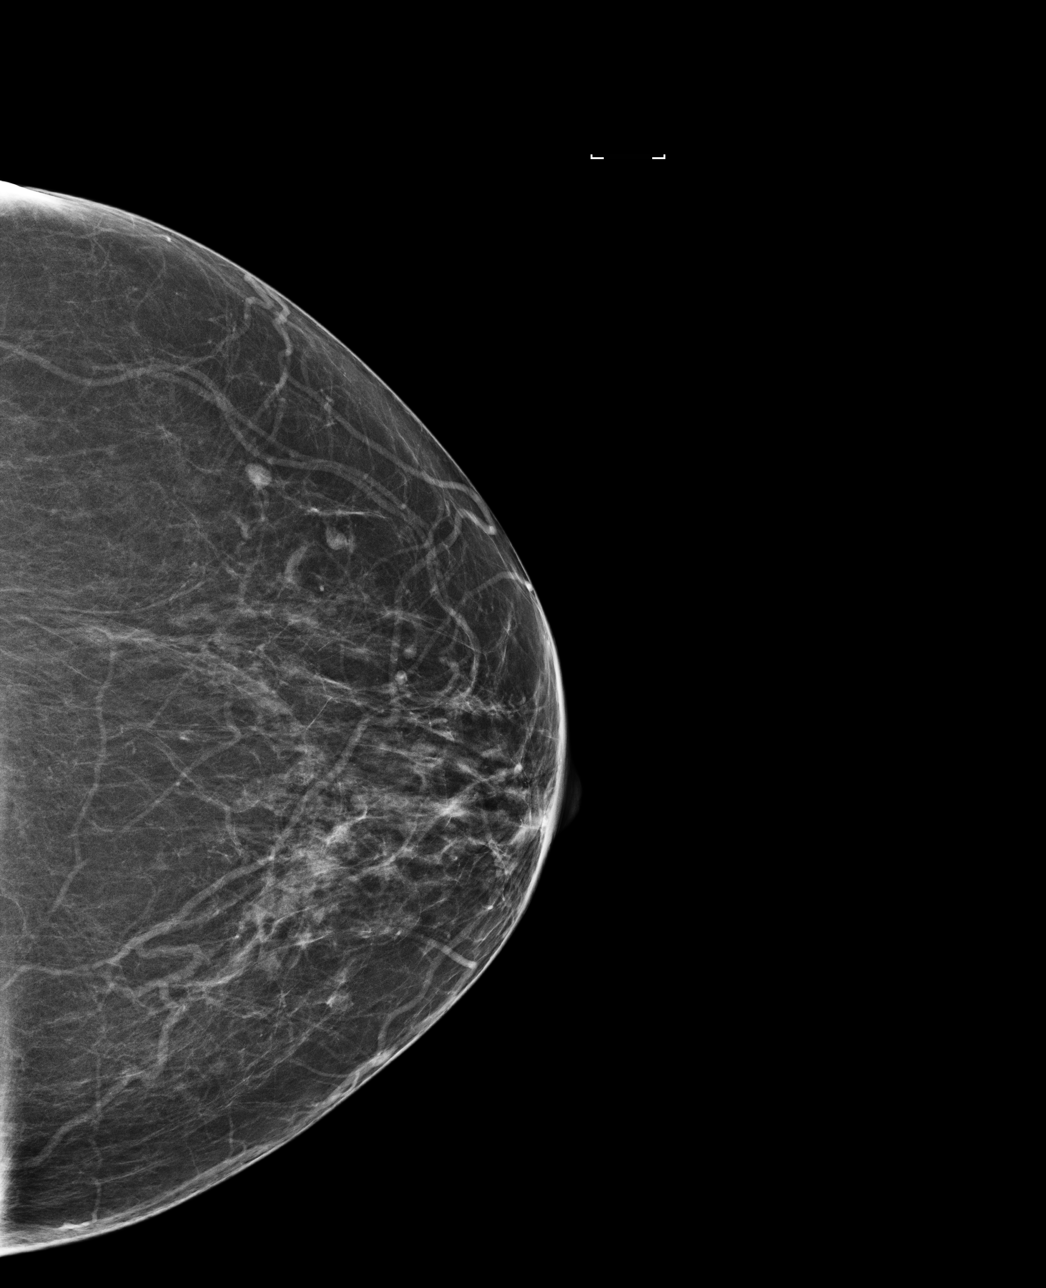

[R CC]
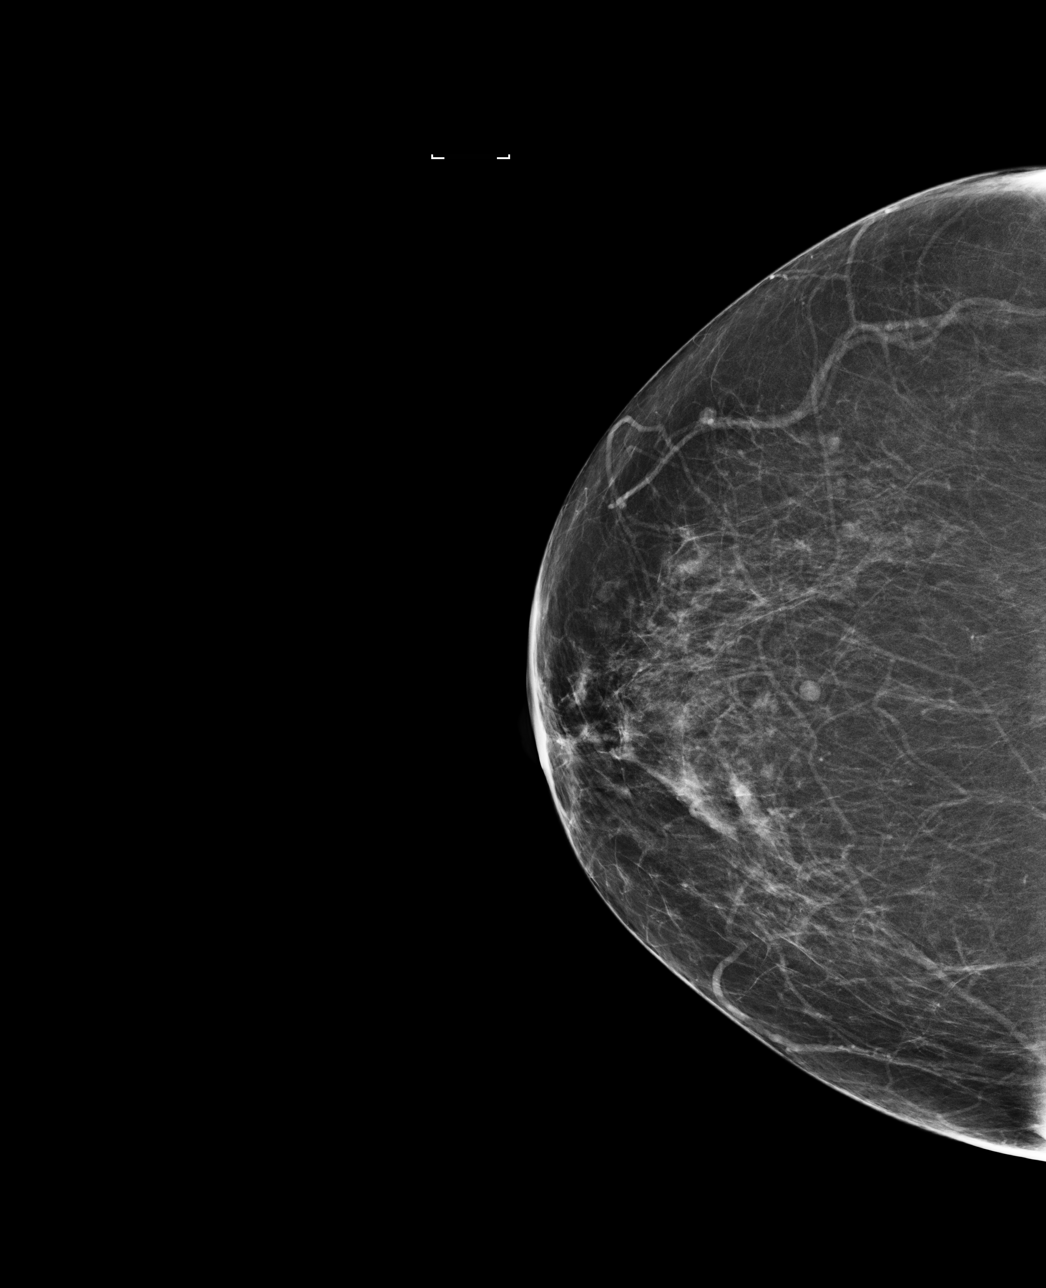

[R MLO]
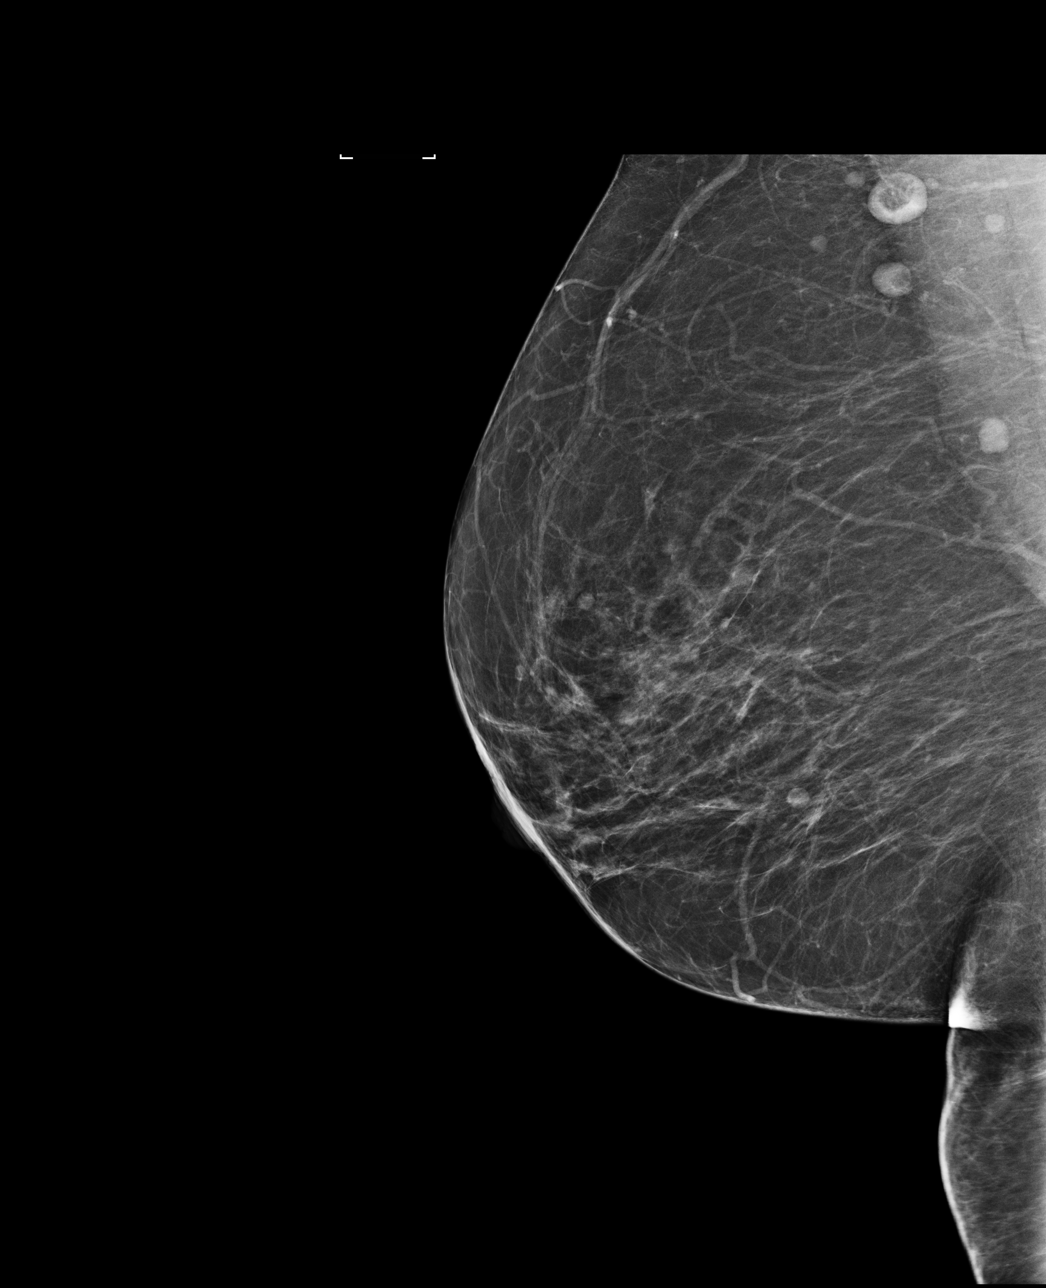

[L MLO]
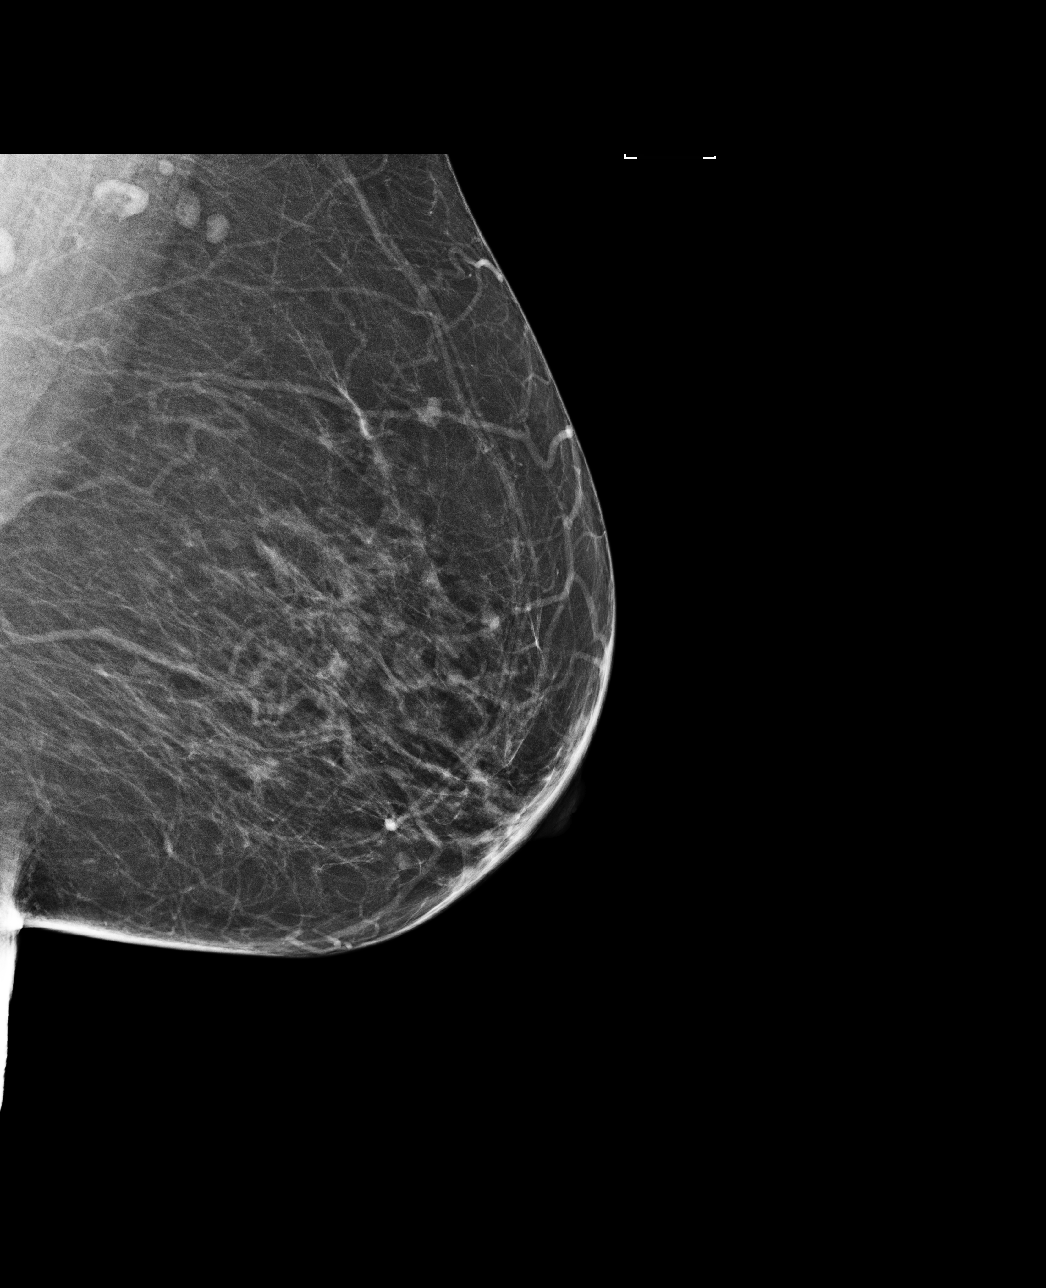

[4 of 4 positions shown; findings below may reference images not displayed]

ACR Breast Density Category c: The breast tissue is heterogeneously
dense, which may obscure small masses.
FINDINGS: There are no findings suspicious for malignancy. Images were
processed with CAD.
IMPRESSION: No mammographic evidence of malignancy. A result letter of this
screening mammogram will be mailed directly to the patient.

RECOMMENDATION:
Screening mammogram in one year. (Code:[0J])

BI-RADS CATEGORY  1: Negative.

## 2015-01-15 NOTE — Discharge Summary (Signed)
PATIENT NAME:  Jennifer Rubio, Jennifer Rubio MR#:  [id number] DATE OF BIRTH:  10/11/1950  DATE OF ADMISSION:  10/20/2013 DATE OF DISCHARGE:    DISCHARGE DIAGNOSES: 1.  Severe degenerative arthritis, right knee.  2.  Hypertension.    OPERATIONS OR PROCEDURES PERFORMED: Right total LCS arthroplasty on 10/20/2013.   HISTORY AND PHYSICAL EXAMINATION: As written and included in the chart.   LABORATORY DATA: As noted in the chart.   COURSE IN HOSPITAL: Following full laboratory and medical evaluation and informed consent, the patient was taken to the Operating Room on 10/20/2013, where right total knee arthroplasty was performed without difficulty. The patient tolerated the procedure quite well. She did have moderate pain and moderate nausea postop, which did resolve. She was advanced in physical therapy to weight-bearing as tolerated with a walker and range of motion of the knee. She was seen on 10/23/2013, and seen to be doing well. Arrangements have been made for her to be discharged tomorrow morning if she remains stable, to her home, with home health physical therapy. Her medications are as indicated on the discharge document. She is to be on enteric-coated aspirin 325 mg twice a day with food as prophylaxis for venous thrombosis and pulmonary embolism. She is to be given a return appointment to the office to see Dr. Katrinka BlazingSmith in approximately 12 days.   ____________________________ [name]. Berda Shelvin, MD ces:cg D: 10/23/2013 18:11:52 ET T: 10/24/2013 06:25:00 ET JOB#: [id number]  cc: [name]. Marlyn Tondreau, MD, <Dictator> [name] Cameran Ahmed MD ELECTRONICALLY SIGNED 10/24/2013 9:27

## 2015-01-15 NOTE — Op Note (Signed)
PATIENT NAME:  Jennifer Rubio, Jennifer Rubio MR#:  [id number] DATE OF BIRTH:  07-08-1951  DATE OF PROCEDURE:  10/20/2013  PREOPERATIVE DIAGNOSIS: Severe degenerative arthritis, right knee.   POSTOPERATIVE DIAGNOSIS: Severe degenerative arthritis, right knee.   PROCEDURE: LCS right total knee arthroplasty.   SURGEON: Myra Rudehristopher Kiriana Worthington, M.D.   ANESTHESIA: Spinal.   COMPLICATIONS: None.   TOURNIQUET TIME: Approximately 75 minutes.   DESCRIPTION OF PROCEDURE: Two grams of Ancef given intravenously prior to the procedure. Spinal anesthesia is induced. Tourniquet is applied to the right lower extremity. The right lower extremity is thoroughly prepped with alcohol and ChloraPrep and draped in standard sterile fashion. The extremity is wrapped out with the Esmarch bandage and pneumatic tourniquet elevated to 325 mmHg. Standard longitudinal total knee incision is made and the dissection carried down and the medial and lateral retinaculum is carefully dissected out. Hemostasis is maintained at all times with the Bovie. Medial parapatellar incision is then made and the knee is flexed and the patella reflected laterally. Standard spur debridement and synovial debridement is performed. Retractors are placed and the proximal tibial cutting block is put into place in the appropriate alignment and pinned. The proximal tibia is then cut off at the appropriate angle. The distal femur is sized as a standard plus. The central drill hole is made and then the femoral rotation guide is put into position and the cutting block pinned. Anterior and posterior cuts are made. A 12.5 spacer block is placed with the knee in flexion and is seen to be in good alignment and stable. Medial release is then performed using the periosteal elevator and the knife. The distal femoral 5 degree valgus cutting block is impacted into place and pinned and the appropriate cut made. The 12.5 mm spacing block is put into position in extension and alignment is  seen to be stable and satisfactory. Distal femoral shaping guide is impacted into place and the appropriate cuts and drill holes made. Proximal tibia is sized as a +4 and the template is pinned. Hypertrophic osteophytes are resected anteriorly. Central hole is made. All trial components are then inserted and this is seen to be satisfactory and stable with an excellent range of motion. The patella is prepared in the standard fashion. All trial components are then removed and the joint is thoroughly irrigated multiple times. The LCS proximal tibial tray is then cemented into place and this is a +4 size. The 12.5 polyethylene rotating platform is put into position. The standard plus distal femoral porous-coated LCS component is impacted into place and the knee is extended and there is seen to be a good fit. All cement is removed. Press-fit patella is then impacted into place and seen to be a good fit. Range of motion is done and this is seen to be excellent. The joint is thoroughly irrigated multiple times. Two Autovac drains are brought out through separate stab wound incisions. Medial retinaculum is then closed with #2 Ethibond. Subcutaneous tissue is carefully closed with 5-0 Vicryl. The skin is closed with the skin stapler. A soft bulky dressing is applied along with a Polar Care and a knee immobilizer. The patient is returned to the recovery room in satisfactory condition having tolerated the procedure quite well.  ____________________________ Jennifer Gandyhristopher Rubio. Teon Hudnall, MD ces:sb D: 10/20/2013 11:25:00 ET T: 10/20/2013 11:45:04 ET JOB#: [id number]  cc: Jennifer Gandyhristopher Rubio. Jesse Hirst, MD, <Dictator> Jennifer GandyHRISTOPHER Rubio Sabian Kuba MD ELECTRONICALLY SIGNED 10/21/2013 8:43

## 2015-04-08 ENCOUNTER — Ambulatory Visit (INDEPENDENT_AMBULATORY_CARE_PROVIDER_SITE_OTHER): Payer: BLUE CROSS/BLUE SHIELD | Admitting: Unknown Physician Specialty

## 2015-04-08 ENCOUNTER — Encounter: Payer: Self-pay | Admitting: Unknown Physician Specialty

## 2015-04-08 VITALS — BP 128/80 | HR 71 | Temp 97.6°F | Ht 61.2 in | Wt 165.8 lb

## 2015-04-08 DIAGNOSIS — L989 Disorder of the skin and subcutaneous tissue, unspecified: Secondary | ICD-10-CM | POA: Diagnosis not present

## 2015-04-08 DIAGNOSIS — I10 Essential (primary) hypertension: Secondary | ICD-10-CM

## 2015-04-08 MED ORDER — CLOTRIMAZOLE-BETAMETHASONE 1-0.05 % EX CREA: 1.0000 "application " | TOPICAL_CREAM | Freq: Two times a day (BID) | CUTANEOUS | Status: DC

## 2015-04-08 MED ORDER — LOSARTAN POTASSIUM-HCTZ 50-12.5 MG PO TABS: 1.0000 | ORAL_TABLET | Freq: Every day | ORAL | Status: DC

## 2015-04-08 NOTE — Assessment & Plan Note (Signed)
Stable on just Losartan/HCTZ

## 2015-04-08 NOTE — Patient Instructions (Signed)
Please call for another appointment for a biopsy if skin lesion no better with cream.

## 2015-04-08 NOTE — Progress Notes (Signed)
BP 128/80 mmHg  Pulse 71  Temp(Src) 97.6 F (36.4 C)  Ht 5' 1.2" (1.554 m)  Wt 165 lb 12.8 oz (75.206 kg)  BMI 31.14 kg/m2  SpO2 100%  LMP  (LMP Unknown)   Subjective:    Patient ID: Jennifer Crossebecca E Rubio, female    DOB: [birth year], 64 y.o.   MRN: [id number]  HPI: Jennifer Rubio is a 64 y.o. female  Chief Complaint  Patient presents with  . Hypertension  . Rash    pt states spot appeared about 2 weeks ago, started as a little bump and has slower gotten bigger. pt states she also has some itching with the spot    Relevant past medical, surgical, family and social history reviewed and updated as indicated. Interim medical history since our last visit reviewed. Allergies and medications reviewed and updated.  Hypertension This is a chronic (Stopped Amlodipine for low and low normal BP.  She is still  taking  Losartan.  ) problem. The problem is controlled. Pertinent negatives include no chest pain, neck pain, palpitations, shortness of breath or sweats. There are no compliance problems.   Rash This is a new problem. The current episode started 1 to 4 weeks ago (Started as a small bump and now bigger). The problem has been gradually worsening since onset. The affected locations include the chest. She was exposed to nothing. Pertinent negatives include no shortness of breath. Past treatments include nothing. Her past medical history is significant for eczema. There is no history of allergies.     Review of Systems  Respiratory: Negative for shortness of breath.   Cardiovascular: Negative for chest pain and palpitations.  Musculoskeletal: Negative for neck pain.  Skin: Positive for rash.    Per HPI unless specifically indicated above     Objective:    BP 128/80 mmHg  Pulse 71  Temp(Src) 97.6 F (36.4 C)  Ht 5' 1.2" (1.554 m)  Wt 165 lb 12.8 oz (75.206 kg)  BMI 31.14 kg/m2  SpO2 100%  LMP  (LMP Unknown)  Wt Readings from Last 3 Encounters:  04/08/15 165 lb 12.8 oz (75.206  kg)  09/28/14 164 lb (74.39 kg)  09/17/12 153 lb (69.4 kg)    Physical Exam  Constitutional: She is oriented to person, place, and time. She appears well-developed and well-nourished. No distress.  HENT:  Head: Normocephalic and atraumatic.  Eyes: Conjunctivae and lids are normal. Right eye exhibits no discharge. Left eye exhibits no discharge. No scleral icterus.  Cardiovascular: Normal rate and regular rhythm.   Pulmonary/Chest: Effort normal and breath sounds normal. No respiratory distress.  Abdominal: Normal appearance. She exhibits no distension. There is no splenomegaly or hepatomegaly. There is no tenderness.  Musculoskeletal: Normal range of motion.  Neurological: She is alert and oriented to person, place, and time.  Skin: Skin is warm, dry and intact.  Noted papule upper right side of chest.    Psychiatric: She has a normal mood and affect. Her behavior is normal. Judgment and thought content normal.       Assessment & Plan:   Problem List Items Addressed This Visit      Cardiovascular and Mediastinum   HTN (hypertension), benign (Chronic)    Stable on just Losartan/HCTZ      Relevant Medications   aspirin EC 81 MG tablet    Other Visit Diagnoses    Skin lesion    -  Primary    Skin lesion upper right chest of uncertain cause.  Will rx combination steroid antifungal cream.  Needs a shave biopsy if no improvement.           Follow up plan: Return in about 6 months (around 10/09/2015), or if symptoms worsen or fail to improve, for Physical and as needed.Marland Kitchen

## 2015-06-29 ENCOUNTER — Ambulatory Visit (INDEPENDENT_AMBULATORY_CARE_PROVIDER_SITE_OTHER): Payer: BLUE CROSS/BLUE SHIELD | Admitting: Unknown Physician Specialty

## 2015-06-29 ENCOUNTER — Encounter: Payer: Self-pay | Admitting: Unknown Physician Specialty

## 2015-06-29 VITALS — BP 104/71 | HR 71 | Temp 98.3°F | Ht 61.2 in | Wt 162.4 lb

## 2015-06-29 DIAGNOSIS — Z23 Encounter for immunization: Secondary | ICD-10-CM

## 2015-06-29 DIAGNOSIS — S86912A Strain of unspecified muscle(s) and tendon(s) at lower leg level, left leg, initial encounter: Secondary | ICD-10-CM | POA: Diagnosis not present

## 2015-06-29 MED ORDER — CYCLOBENZAPRINE HCL 10 MG PO TABS: 10.0000 mg | ORAL_TABLET | Freq: Every day | ORAL | Status: DC

## 2015-06-29 NOTE — Progress Notes (Signed)
BP 104/71 mmHg  Pulse 71  Temp(Src) 98.3 F (36.8 C)  Ht 5' 1.2" (1.554 m)  Wt 162 lb 6.4 oz (73.664 kg)  BMI 30.50 kg/m2  SpO2 98%  LMP  (LMP Unknown)   Subjective:    Patient ID: Jennifer Rubio, female    DOB: Jan 05, 1951, 64 y.o.   MRN: [id number]  HPI: Jennifer Rubio is a 64 y.o. female  Chief Complaint  Patient presents with  . Leg Pain    pt states she has been having pain in the left leg for a while now. States it hurts more at night and it like a achey feeling.   Leg Pain The pt presents with c/o left leg pain onset 1 to 2 months ago.  She states the pain occurs at night and disrupts sleep, current pain score 0/10 at night 9/10.  The quality of pain is a constant aching pain, it radiates from her left posterior hip down to the knee.  Aggravating factors laying down.  Relieving factors walking.  She is able to bear weight on the left leg.  She occasionally takes Aleve for the pain with slight relief of symptoms.  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  Pertinent negatives denies trauma, erythema, rash, warmth, chest pain, shortness of breath, calf pain, numbness/tingling, or edema.  Relevant past medical, surgical, family and social history reviewed and updated as indicated. Interim medical history since our last visit reviewed. Allergies and medications reviewed and updated.  Review of Systems  Constitutional: Negative.   Respiratory: Negative.   Cardiovascular: Negative.   Gastrointestinal: Negative.   Musculoskeletal: Negative for joint swelling, gait problem, neck pain and neck stiffness.       Intermittent left leg pain  Skin: Negative.   Neurological: Negative.   Hematological: Negative.     Per HPI unless specifically indicated above     Objective:    BP 104/71 mmHg  Pulse 71  Temp(Src) 98.3 F (36.8 C)  Ht 5' 1.2" (1.554 m)  Wt 162 lb 6.4 oz (73.664 kg)   BMI 30.50 kg/m2  SpO2 98%  LMP  (LMP Unknown)  Wt Readings from Last 3 Encounters:  06/29/15 162 lb 6.4 oz (73.664 kg)  04/08/15 165 lb 12.8 oz (75.206 kg)  09/28/14 164 lb (74.39 kg)    Physical Exam  Constitutional: She is oriented to person, place, and time. She appears well-developed and well-nourished. No distress.  HENT:  Head: Normocephalic and atraumatic.  Right Ear: External ear normal.  Left Ear: External ear normal.  Nose: Nose normal.  Neck: Normal range of motion. Neck supple.  Cardiovascular: Normal rate, regular rhythm, normal heart sounds and intact distal pulses.   Pulmonary/Chest: Effort normal and breath sounds normal. No respiratory distress. She has no wheezes.  Musculoskeletal: She exhibits no edema.  Tenderness present in left posterior leg that radiates from the hip to the knee during ROM; no tenderness present in right leg during ROM  Neurological: She is alert and oriented to person, place, and time.  Skin: Skin is warm and dry. No rash noted. She is not diaphoretic. No erythema. No pallor.  Psychiatric: She has a normal mood and affect. Her behavior is normal. Judgment and thought content normal.    Results for orders placed or performed in visit on 10/20/13  Creatinine, serum  Result Value Ref Range  Creatinine 0.83 0.60-1.30 mg/dL   EGFR (African American) >60    EGFR (Non-African Amer.) >60   Hemoglobin  Result Value Ref Range   HGB 11.0 (L) 12.0-16.0 g/dL  Creatinine, serum  Result Value Ref Range   Creatinine 0.97 0.60-1.30 mg/dL   EGFR (African American) >60    EGFR (Non-African Amer.) >60       Assessment & Plan:   Problem List Items Addressed This Visit      Unprioritized   Muscle strain of left lower extremity    Prescribed Cyclobenzaprine (Flexiril) 10 mg daily at bedtime for muscle spasms Instructed pt to notify office if symptoms persist or worsen       Other Visit Diagnoses    Immunization due    -  Primary    Relevant  Orders    Flu Vaccine QUAD 36+ mos PF IM (Fluarix & Fluzone Quad PF) (Completed)        Follow up plan: Return in about 2 months (around 08/29/2015) for CPE.

## 2015-06-29 NOTE — Assessment & Plan Note (Signed)
Prescribed Cyclobenzaprine (Flexiril) 10 mg daily at bedtime for muscle spasms Instructed pt to notify office if symptoms persist or worsen

## 2015-10-12 ENCOUNTER — Encounter: Payer: BLUE CROSS/BLUE SHIELD | Admitting: Unknown Physician Specialty

## 2015-11-02 ENCOUNTER — Encounter: Payer: BLUE CROSS/BLUE SHIELD | Admitting: Unknown Physician Specialty

## 2015-12-25 ENCOUNTER — Other Ambulatory Visit: Payer: Self-pay | Admitting: Unknown Physician Specialty

## 2015-12-25 NOTE — Telephone Encounter (Signed)
rx

## 2015-12-29 ENCOUNTER — Other Ambulatory Visit: Payer: Self-pay | Admitting: Pediatrics

## 2015-12-29 DIAGNOSIS — Z1231 Encounter for screening mammogram for malignant neoplasm of breast: Secondary | ICD-10-CM

## 2016-01-11 ENCOUNTER — Ambulatory Visit
Admission: RE | Admit: 2016-01-11 | Discharge: 2016-01-11 | Disposition: A | Discharge status: Home / Self Care | Payer: BLUE CROSS/BLUE SHIELD | Source: Ambulatory Visit | Attending: Pediatrics | Admitting: Pediatrics

## 2016-01-11 DIAGNOSIS — Z1231 Encounter for screening mammogram for malignant neoplasm of breast: Secondary | ICD-10-CM | POA: Diagnosis present

## 2016-01-11 IMAGING — MG MM DIGITAL SCREENING BILAT W/ CAD
4 series · 4 of 4 positions shown · non-contrast
Comparison: Previous exam(s).

CLINICAL DATA: Screening.

EXAM:
DIGITAL SCREENING BILATERAL MAMMOGRAM WITH CAD

[L CC]
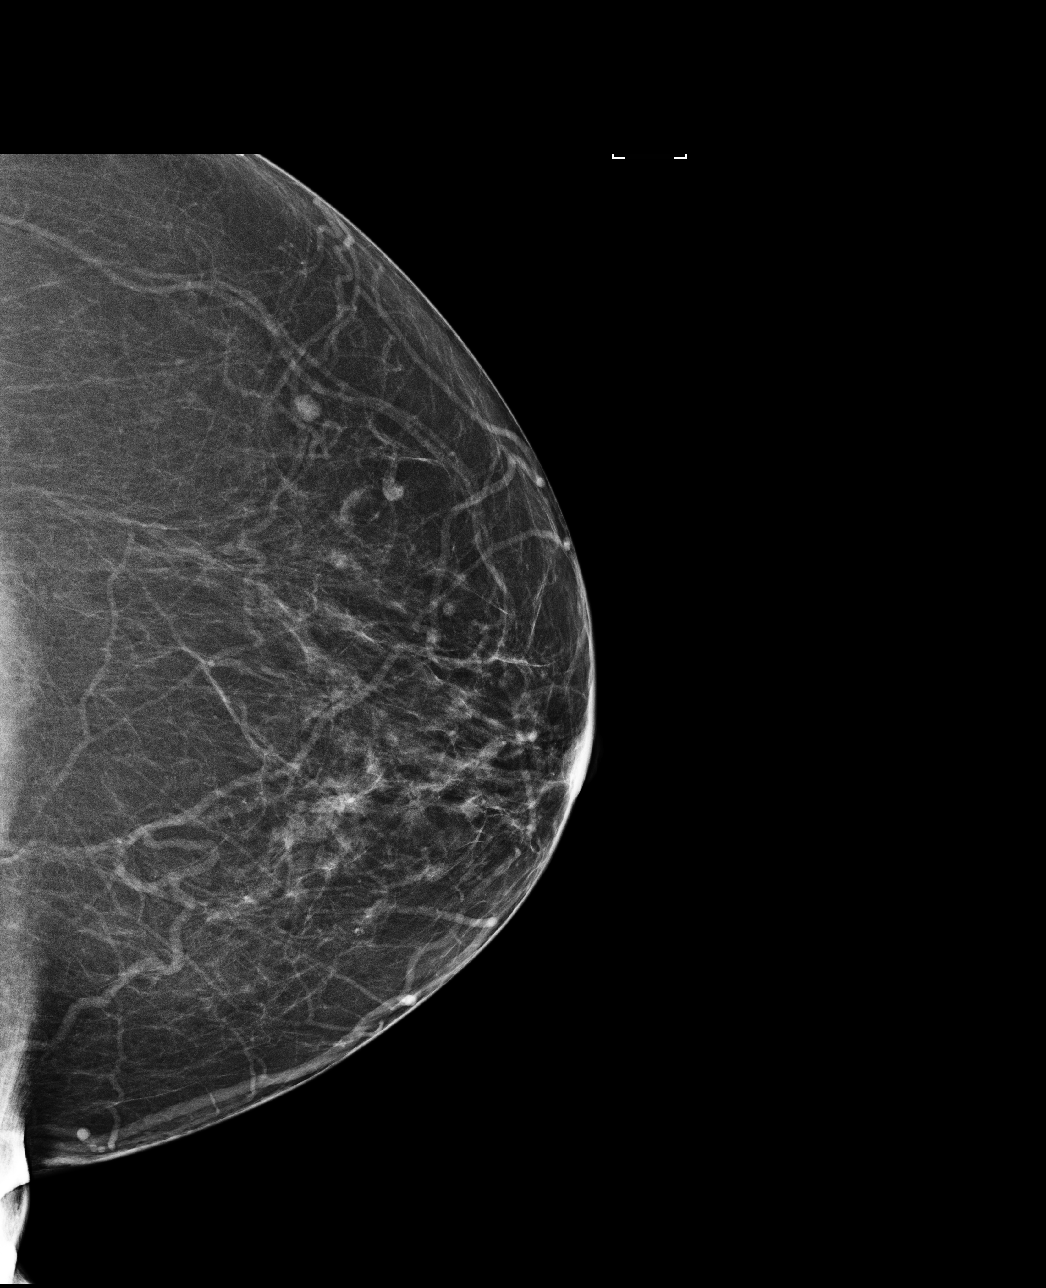

[R CC]
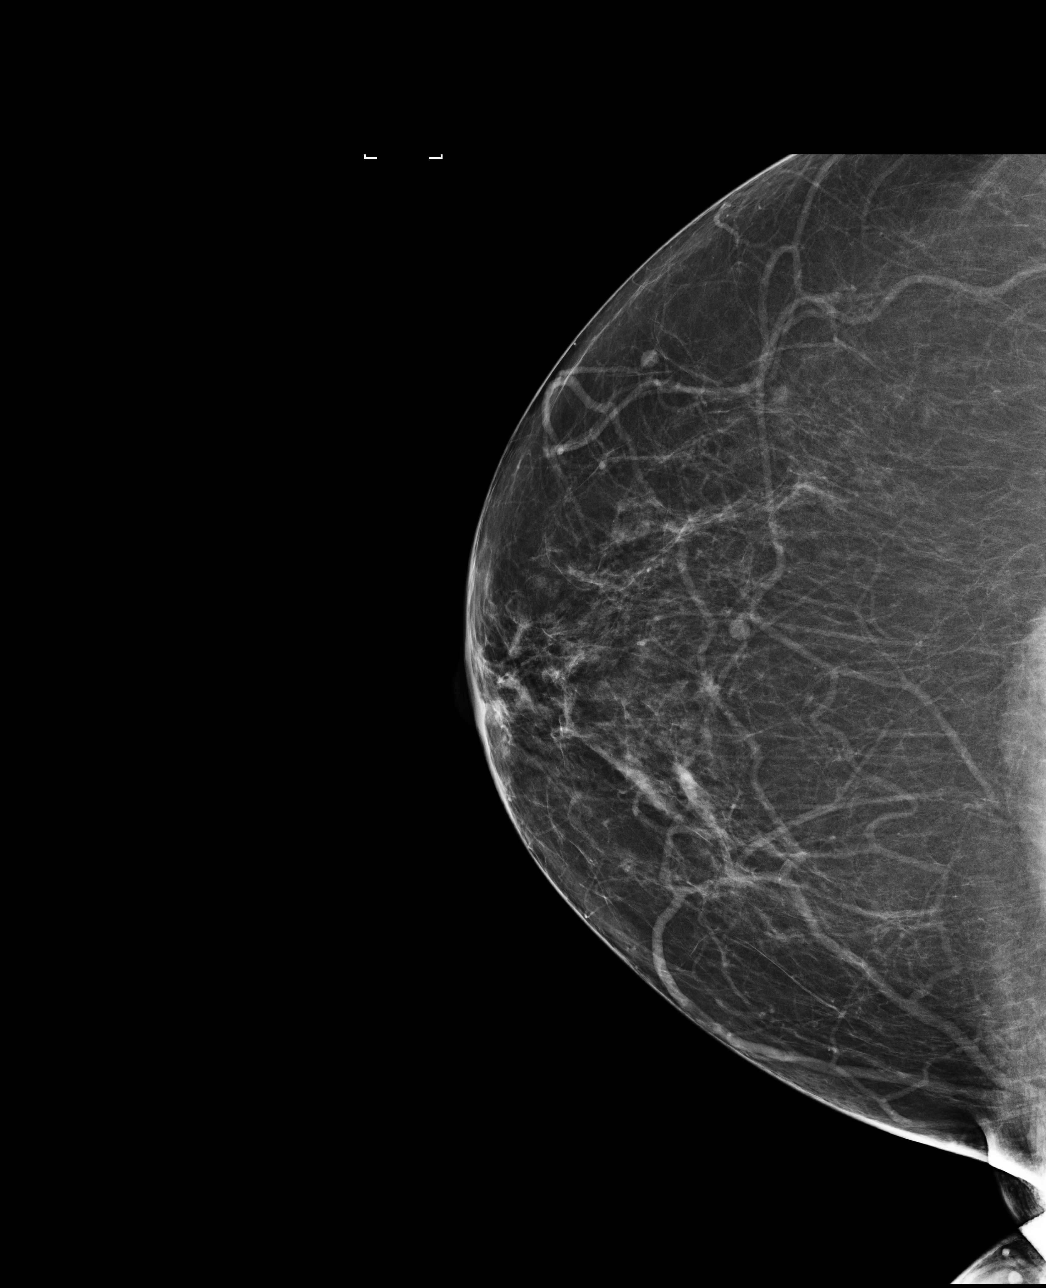

[R MLO]
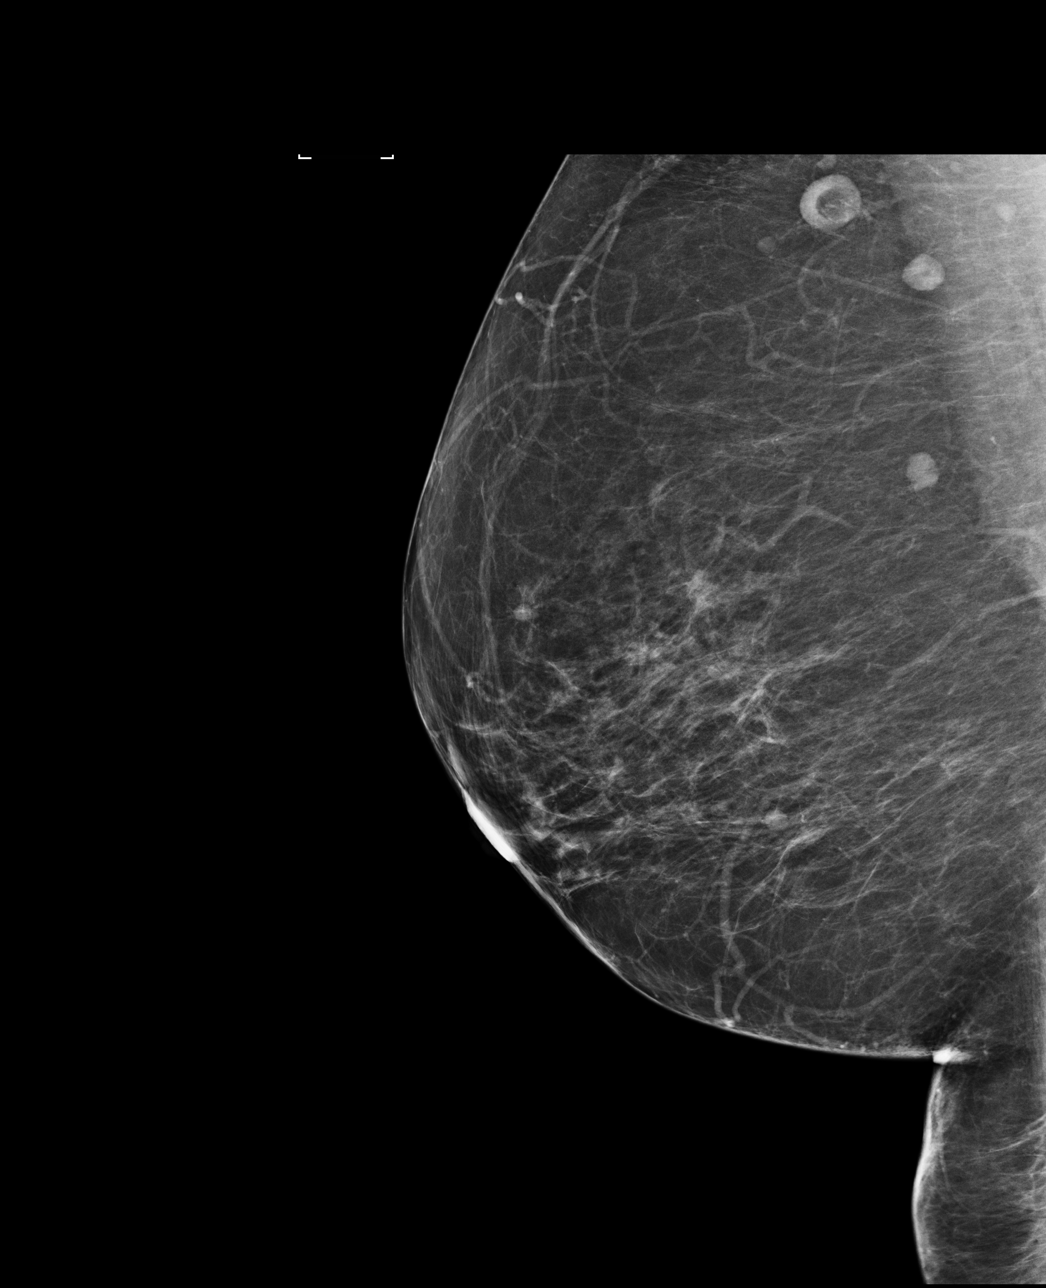

[L MLO]
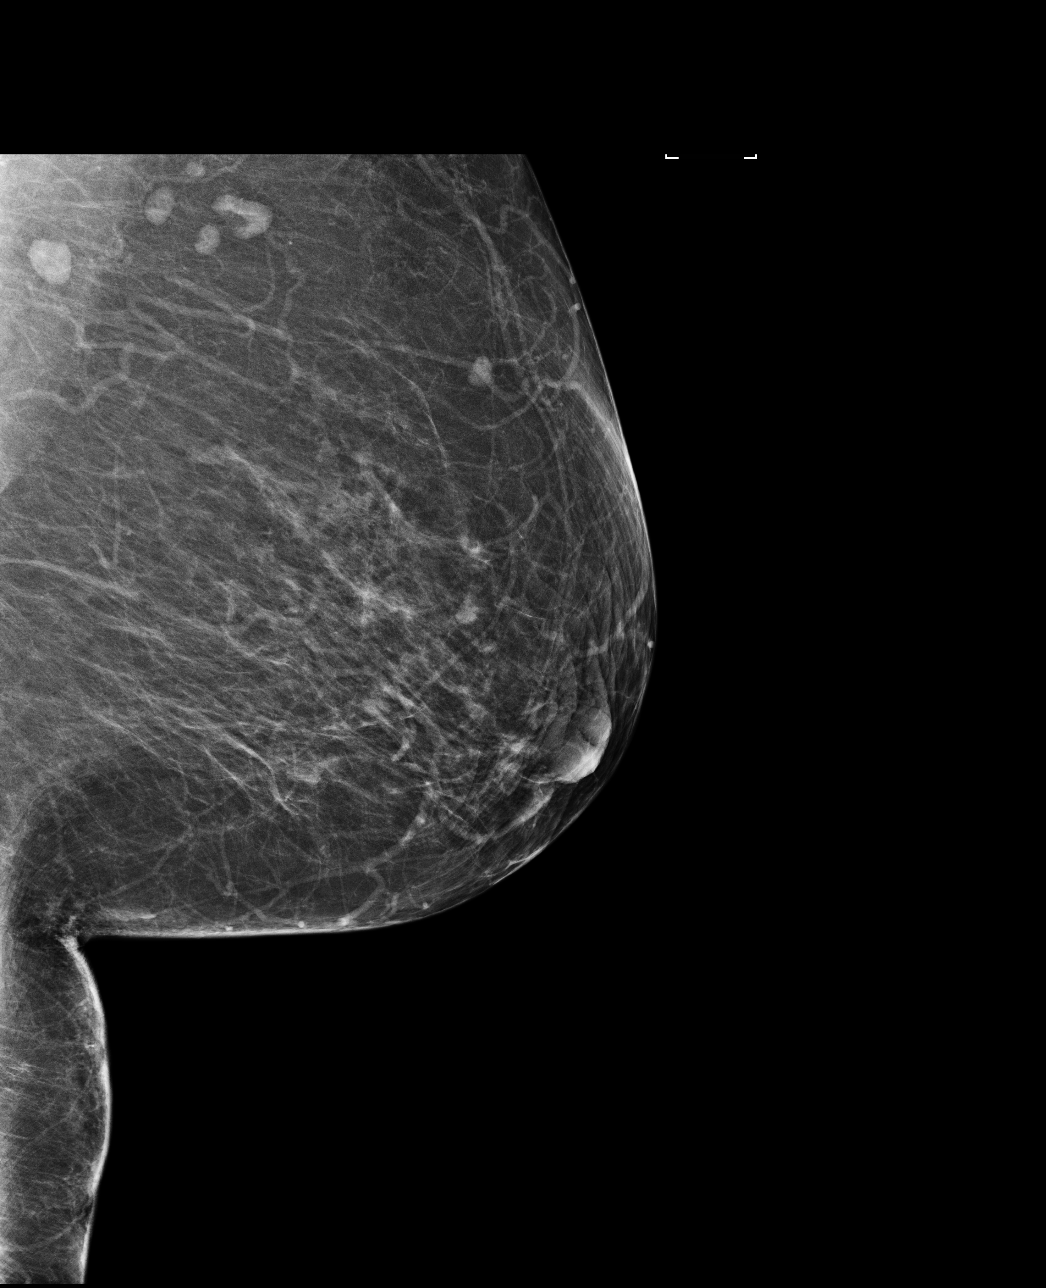

[4 of 4 positions shown; findings below may reference images not displayed]

ACR Breast Density Category b: There are scattered areas of
fibroglandular density.
FINDINGS: In the right breast, a possible asymmetry warrants further
evaluation. In the left breast, no findings suspicious for
malignancy. Images were processed with CAD.
IMPRESSION: Further evaluation is suggested for possible asymmetry in the right
breast.

RECOMMENDATION:
Diagnostic mammogram and possibly ultrasound of the right breast.
(Code:[1O])

The patient will be contacted regarding the findings, and additional
imaging will be scheduled.

BI-RADS CATEGORY  0: Incomplete. Need additional imaging evaluation
and/or prior mammograms for comparison.

## 2016-01-18 ENCOUNTER — Other Ambulatory Visit: Payer: Self-pay | Admitting: Pediatrics

## 2016-01-18 DIAGNOSIS — R928 Other abnormal and inconclusive findings on diagnostic imaging of breast: Secondary | ICD-10-CM

## 2016-01-24 ENCOUNTER — Ambulatory Visit
Admission: RE | Admit: 2016-01-24 | Discharge: 2016-01-24 | Disposition: A | Discharge status: Home / Self Care | Payer: BLUE CROSS/BLUE SHIELD | Source: Ambulatory Visit | Attending: Pediatrics | Admitting: Pediatrics

## 2016-01-24 DIAGNOSIS — R928 Other abnormal and inconclusive findings on diagnostic imaging of breast: Secondary | ICD-10-CM | POA: Diagnosis present

## 2016-01-24 IMAGING — MG MM DIGITAL DIAGNOSTIC UNILAT*R* W/ TOMO W/ CAD
6 of 9 series · 6 of 21 positions shown · non-contrast
Comparison: Previous exam(s).

CLINICAL DATA: Recall from screening mammogram.

EXAM:
2D DIGITAL DIAGNOSTIC UNILATERAL RIGHT MAMMOGRAM WITH CAD AND
ADJUNCT TOMO

[R CC (1 of 2)]
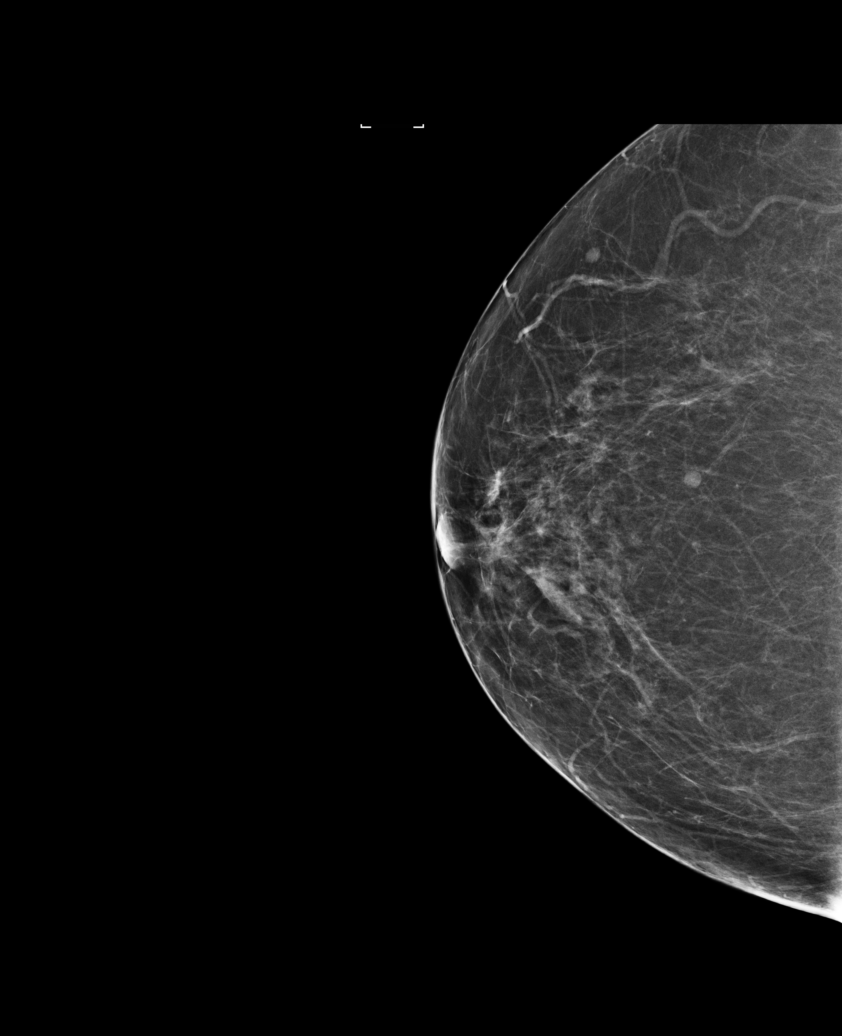

[R CC (2 of 2)]
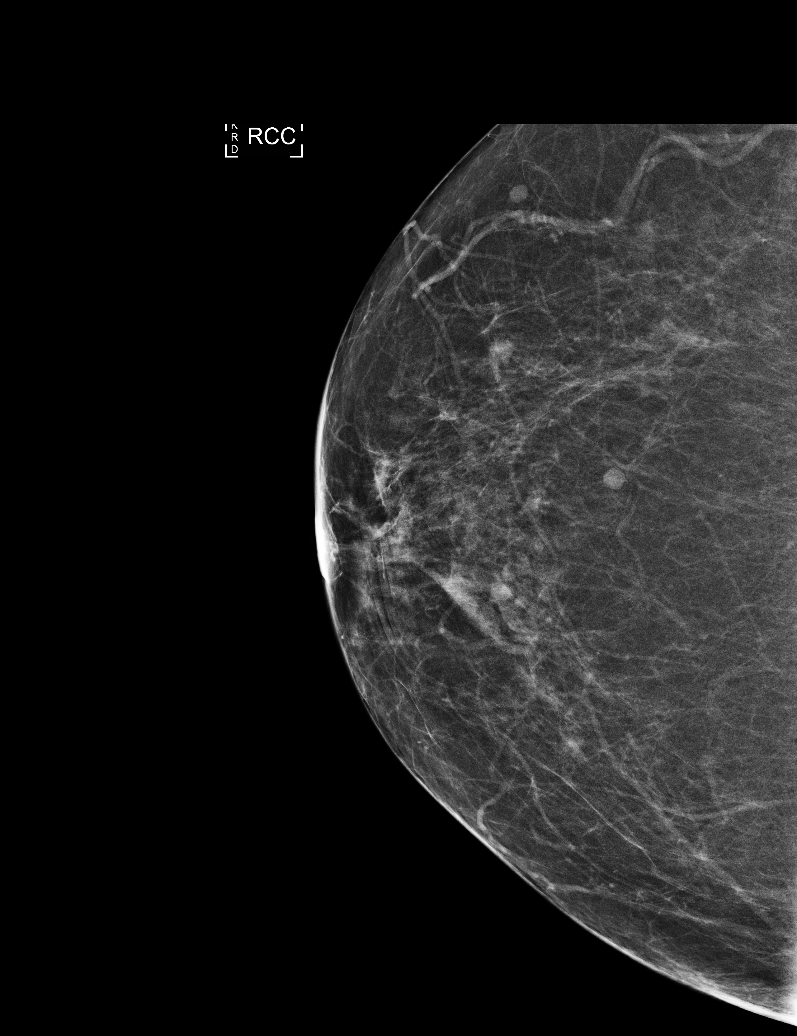

[R MLO synth-2D]
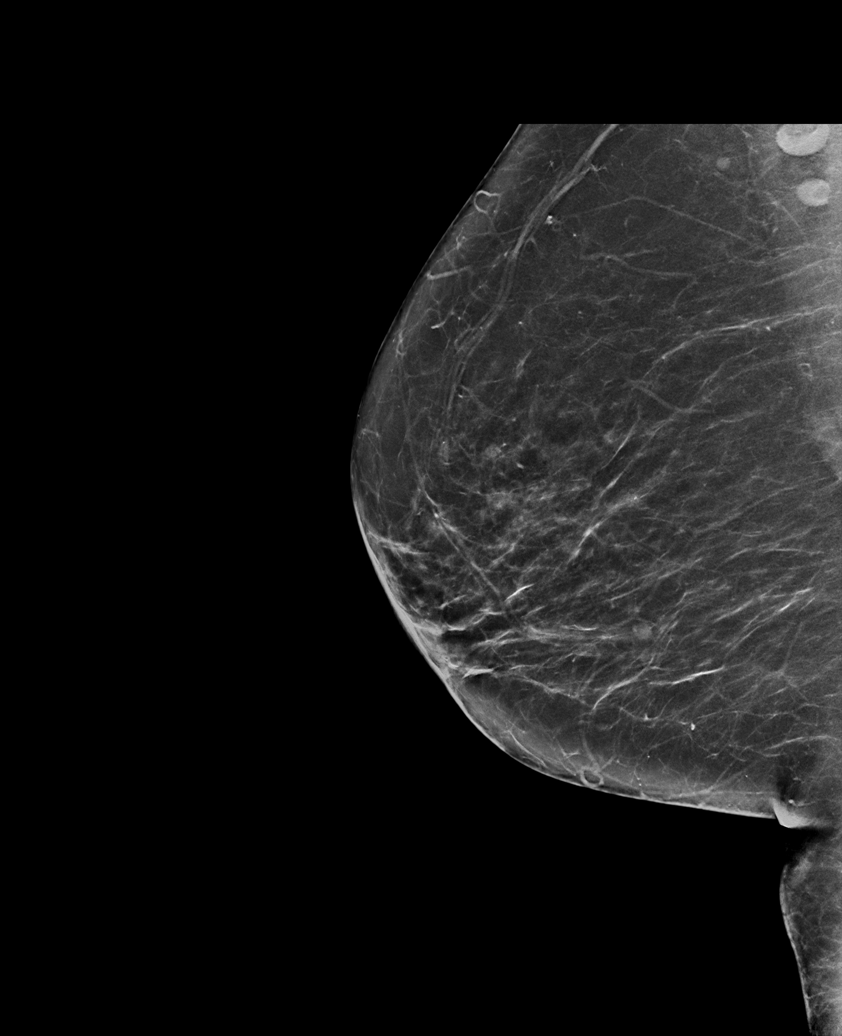

[R CC synth-2D (1 of 2)]
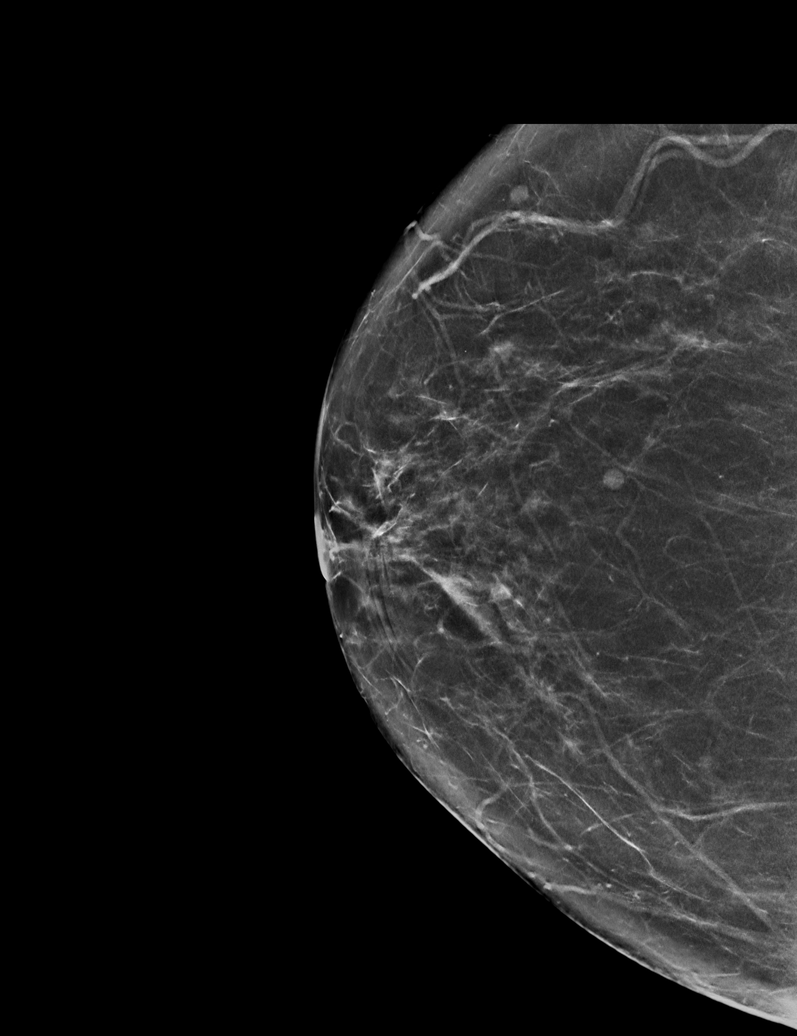

[R MLO]
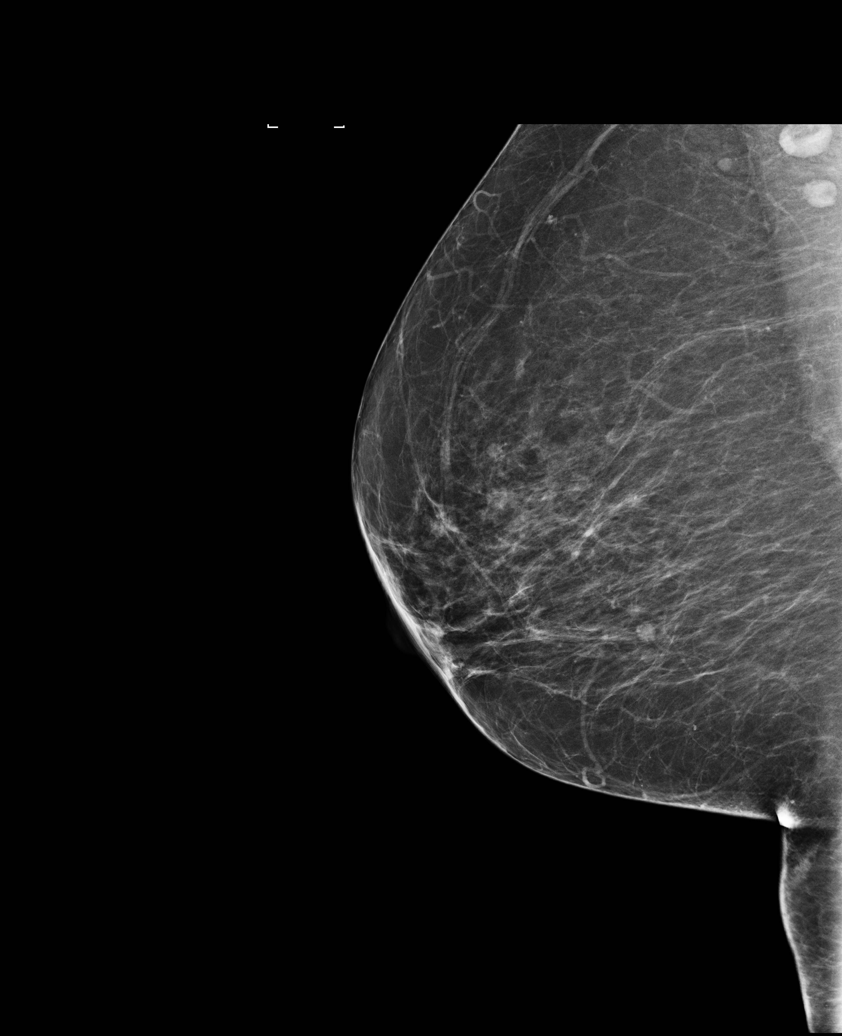

[R CC synth-2D (2 of 2)]
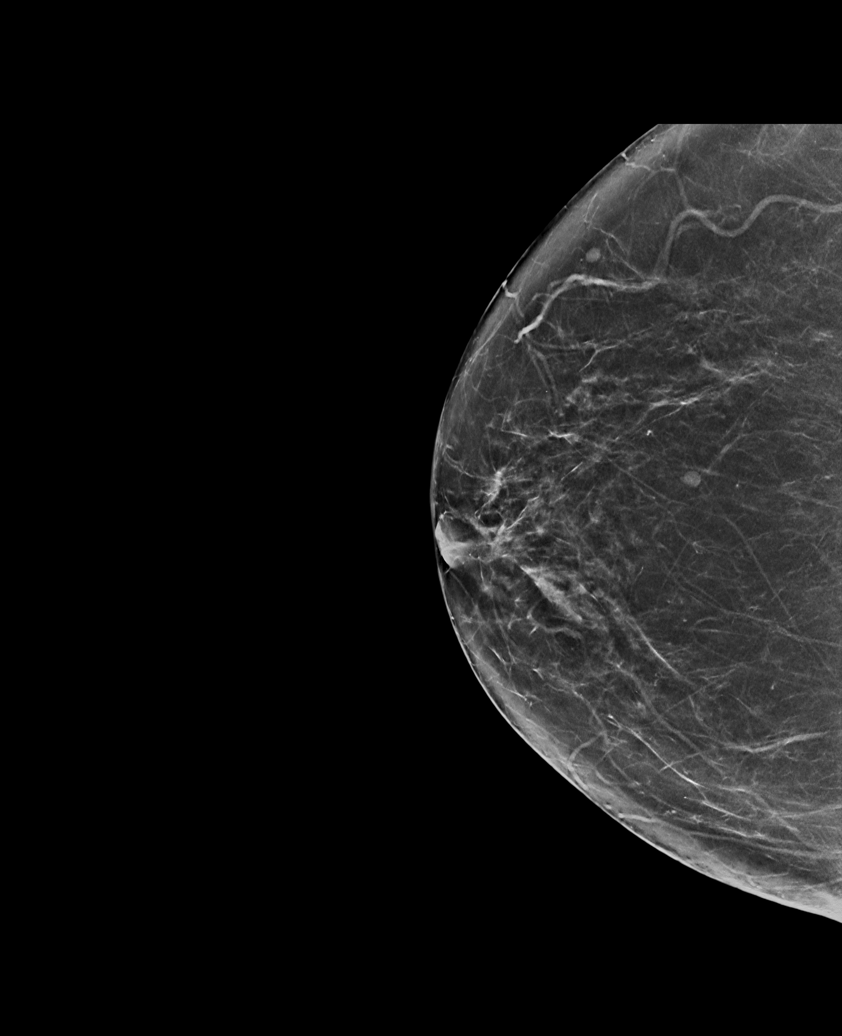

[6 of 21 positions shown; findings below may reference images not displayed]

ACR Breast Density Category b: There are scattered areas of
fibroglandular density.
FINDINGS: Additional views of the right breast demonstrate no persistent
abnormality. The appearance noted on screening study is consistent
with a summation shadow.

Mammographic images were processed with CAD.
IMPRESSION: No persistent abnormality on additional evaluation of the right
breast.

RECOMMENDATION:
Screening mammography in 1 year.

I have discussed the findings and recommendations with the patient.
Results were also provided in writing at the conclusion of the
visit. If applicable, a reminder letter will be sent to the patient
regarding the next appointment.

BI-RADS CATEGORY  1: Negative.

## 2016-07-27 ENCOUNTER — Emergency Department (HOSPITAL_COMMUNITY)
Admission: EM | Admit: 2016-07-27 | Discharge: 2016-07-27 | Disposition: A | Discharge status: Home / Self Care | Payer: No Typology Code available for payment source | Attending: Emergency Medicine | Admitting: Emergency Medicine

## 2016-07-27 ENCOUNTER — Encounter (HOSPITAL_COMMUNITY): Payer: Self-pay

## 2016-07-27 ENCOUNTER — Emergency Department (HOSPITAL_COMMUNITY): Payer: No Typology Code available for payment source

## 2016-07-27 DIAGNOSIS — I10 Essential (primary) hypertension: Secondary | ICD-10-CM | POA: Insufficient documentation

## 2016-07-27 DIAGNOSIS — M545 Low back pain: Secondary | ICD-10-CM | POA: Diagnosis not present

## 2016-07-27 DIAGNOSIS — Y939 Activity, unspecified: Secondary | ICD-10-CM | POA: Diagnosis not present

## 2016-07-27 DIAGNOSIS — Y999 Unspecified external cause status: Secondary | ICD-10-CM | POA: Insufficient documentation

## 2016-07-27 DIAGNOSIS — Z7982 Long term (current) use of aspirin: Secondary | ICD-10-CM | POA: Insufficient documentation

## 2016-07-27 DIAGNOSIS — Y92411 Interstate highway as the place of occurrence of the external cause: Secondary | ICD-10-CM | POA: Insufficient documentation

## 2016-07-27 DIAGNOSIS — Z79899 Other long term (current) drug therapy: Secondary | ICD-10-CM | POA: Diagnosis not present

## 2016-07-27 DIAGNOSIS — M79671 Pain in right foot: Secondary | ICD-10-CM | POA: Insufficient documentation
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
## 2016-07-27 MED ORDER — METHOCARBAMOL 500 MG PO TABS: 500.0000 mg | ORAL_TABLET | Freq: Two times a day (BID) | ORAL | 0 refills | Status: DC | PRN

## 2016-07-27 MED ORDER — IBUPROFEN 600 MG PO TABS: 600.0000 mg | ORAL_TABLET | Freq: Four times a day (QID) | ORAL | 0 refills | Status: DC | PRN

## 2016-07-27 MED ORDER — IBUPROFEN 400 MG PO TABS
600.0000 mg | ORAL_TABLET | Freq: Once | ORAL | Status: AC
  Administered 2016-07-27: 600 mg via ORAL
  Filled 2016-07-27: qty 2

## 2016-07-27 NOTE — Discharge Instructions (Signed)
Take your medications as prescribed as needed for pain relief. I also recommend applying ice to affected area for 15 minutes 3-4 times daily for additional pain relief. Follow-up with your primary care provider within the next week if your symptoms have not improved. Please return to the Emergency Department if symptoms worsen or new onset of fever, headache, dizziness, visual changes, neck stiffness, back pain, chest pain, difficulty breathing, abdominal pain, vomiting, numbness, tingling, weakness, swelling.
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
## 2016-11-11 ENCOUNTER — Emergency Department
Admission: EM | Admit: 2016-11-11 | Discharge: 2016-11-11 | Disposition: A | Discharge status: Home / Self Care | Payer: BLUE CROSS/BLUE SHIELD | Attending: Emergency Medicine | Admitting: Emergency Medicine

## 2016-11-11 ENCOUNTER — Emergency Department: Payer: BLUE CROSS/BLUE SHIELD

## 2016-11-11 ENCOUNTER — Encounter: Payer: Self-pay | Admitting: Emergency Medicine

## 2016-11-11 DIAGNOSIS — R0789 Other chest pain: Secondary | ICD-10-CM

## 2016-11-11 DIAGNOSIS — Z7982 Long term (current) use of aspirin: Secondary | ICD-10-CM | POA: Insufficient documentation

## 2016-11-11 DIAGNOSIS — Z79899 Other long term (current) drug therapy: Secondary | ICD-10-CM | POA: Diagnosis not present

## 2016-11-11 DIAGNOSIS — Y999 Unspecified external cause status: Secondary | ICD-10-CM | POA: Diagnosis not present

## 2016-11-11 DIAGNOSIS — Y9389 Activity, other specified: Secondary | ICD-10-CM | POA: Diagnosis not present

## 2016-11-11 DIAGNOSIS — I10 Essential (primary) hypertension: Secondary | ICD-10-CM | POA: Diagnosis not present

## 2016-11-11 DIAGNOSIS — Y92410 Unspecified street and highway as the place of occurrence of the external cause: Secondary | ICD-10-CM | POA: Diagnosis not present

## 2016-11-11 DIAGNOSIS — S299XXA Unspecified injury of thorax, initial encounter: Secondary | ICD-10-CM | POA: Diagnosis present
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
## 2016-11-11 MED ORDER — HYDROCODONE-ACETAMINOPHEN 5-325 MG PO TABS
1.0000 | ORAL_TABLET | Freq: Once | ORAL | Status: AC
  Administered 2016-11-11: 1 via ORAL
  Filled 2016-11-11: qty 1

## 2016-11-11 MED ORDER — HYDROCODONE-ACETAMINOPHEN 5-325 MG PO TABS: 1.0000 | ORAL_TABLET | Freq: Four times a day (QID) | ORAL | 0 refills | Status: DC | PRN

## 2016-11-11 MED ORDER — IBUPROFEN 800 MG PO TABS: 800.0000 mg | ORAL_TABLET | Freq: Three times a day (TID) | ORAL | 0 refills | Status: DC | PRN

## 2016-11-11 MED ORDER — IBUPROFEN 800 MG PO TABS
800.0000 mg | ORAL_TABLET | Freq: Once | ORAL | Status: AC
  Administered 2016-11-11: 800 mg via ORAL
  Filled 2016-11-11: qty 1

## 2016-11-11 NOTE — Discharge Instructions (Signed)
1.  You may take pain medicines as needed (Motrin/Norco #15). 2.  Apply moist heat to affected area several times daily. 3.  Return to the ER for worsening symptoms, persistent vomiting, difficulty breathing or other concerns. 

## 2016-11-11 NOTE — ED Notes (Signed)
Pt asked for something to eat, this RN offered pt a sandwich tray and a drink, pt stated that would be fine; upon checking the ED fridge, noticed that there were no sandwich trays available; charge nurse notified, orderly called and requested to bring up sandwich trays; pt notified by this RN that as soon as the sandwich trays the next nurse on duty will be bring one for her; pt was provided with ginger ale; pt and family expressed gratitude.
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
## 2016-11-28 ENCOUNTER — Encounter: Payer: Self-pay | Admitting: Emergency Medicine

## 2016-11-28 ENCOUNTER — Emergency Department
Admission: EM | Admit: 2016-11-28 | Discharge: 2016-11-28 | Disposition: A | Discharge status: Home / Self Care | Payer: BLUE CROSS/BLUE SHIELD | Attending: Emergency Medicine | Admitting: Emergency Medicine

## 2016-11-28 DIAGNOSIS — I10 Essential (primary) hypertension: Secondary | ICD-10-CM | POA: Diagnosis not present

## 2016-11-28 DIAGNOSIS — Z7982 Long term (current) use of aspirin: Secondary | ICD-10-CM | POA: Insufficient documentation

## 2016-11-28 DIAGNOSIS — R51 Headache: Secondary | ICD-10-CM | POA: Diagnosis present

## 2016-11-28 DIAGNOSIS — Z79899 Other long term (current) drug therapy: Secondary | ICD-10-CM | POA: Insufficient documentation

## 2016-11-28 DIAGNOSIS — R519 Headache, unspecified: Secondary | ICD-10-CM

## 2016-11-28 NOTE — Discharge Instructions (Signed)
As discussed you can try over the counter medication for your headache (eg tylenol, ibuprofen). Please seek medical attention for any high fevers, chest pain, shortness of breath, change in behavior, persistent vomiting, bloody stool or any other new or concerning symptoms.
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
## 2016-11-28 NOTE — ED Provider Notes (Signed)
[facility] Emergency Department Provider Note   ____________________________________________   I have reviewed the triage vital signs and the nursing notes.   HISTORY  Chief Complaint Headache   History limited by: Not Limited   HPI Jennifer Rubio is a 65 y.o. female who presents to the emergency department today is of concerns for continued headache. Patient states that she hit her head against a washing machine 4 days ago. She hit the front of her face. She states the glasses did cause a little cut to her forehead. Since that time she has pain in her forehead and bridge of nose. She has not tried any medication for this pain. The patient has not had any vomiting. In addition the patient states she had brief episode of abdominal pain this morning. Located in the lower abdomen. It is no longer present at the time my examination. The patient does not notice any recent change in defecation or urination. No fevers.   Past Medical History:  Diagnosis Date  . Cellulitis and abscess of leg January 2013  . Hypertension   . Osteoarthritis   . Urinary tract infection January 2013    Patient Active Problem List   Diagnosis Date Noted  . Muscle strain of left lower extremity 06/29/2015  . Anemia 10/21/2011  . UTI (lower urinary tract infection) 10/21/2011  . Cellulitis of knee, left 10/19/2011  . HTN (hypertension), benign 10/19/2011  . Obesity 10/19/2011  . Osteoarthritis 10/19/2011  . Hyperglycemia 10/19/2011    Past Surgical History:  Procedure Laterality Date  . ABDOMINAL HYSTERECTOMY    . BLADDER SUSPENSION  11/26/2011   Procedure: TRANSVAGINAL TAPE (TVT) PROCEDURE;  Surgeon: Genia Del, MD;  Location: [facility];  Service: Gynecology;  Laterality: N/A;  . BREAST CYST ASPIRATION Right   . BUNIONECTOMY    . CYSTOSCOPY  11/26/2011   Procedure: CYSTOSCOPY;  Surgeon: Genia Del, MD;  Location: [facility];  Service: Gynecology;  Laterality: N/A;  .  KNEE ARTHROSCOPY     both knees  . RECTOCELE REPAIR  11/26/2011   Procedure: POSTERIOR REPAIR (RECTOCELE);  Surgeon: Genia Del, MD;  Location: [facility];  Service: Gynecology;  Laterality: N/A;  . ROBOTIC ASSISTED LAPAROSCOPIC SACROCOLPOPEXY  11/26/2011   Procedure: ROBOTIC ASSISTED LAPAROSCOPIC SACROCOLPOPEXY;  Surgeon: Genia Del, MD;  Location: [facility];  Service: Gynecology;  Laterality: N/A;    Prior to Admission medications   Medication Sig Start Date End Date Taking? Authorizing Provider  aspirin EC 81 MG tablet Take 81 mg by mouth.    Historical Provider, MD  cyclobenzaprine (FLEXERIL) 10 MG tablet Take 1 tablet (10 mg total) by mouth at bedtime. 06/29/15   Gabriel Cirri, NP  HYDROcodone-acetaminophen (NORCO) 5-325 MG tablet Take 1 tablet by mouth every 6 (six) hours as needed for moderate pain. 11/11/16   Irean Hong, MD  ibuprofen (ADVIL,MOTRIN) 800 MG tablet Take 1 tablet (800 mg total) by mouth every 8 (eight) hours as needed for moderate pain. 11/11/16   Irean Hong, MD  losartan-hydrochlorothiazide (HYZAAR) 50-12.5 MG tablet TAKE 1 TABLET BY MOUTH DAILY. 12/26/15   Gabriel Cirri, NP  methocarbamol (ROBAXIN) 500 MG tablet Take 1 tablet (500 mg total) by mouth 2 (two) times daily as needed. 07/27/16   Barrett Henle, PA-C    Allergies Sulfa antibiotics  Family History  Problem Relation Age of Onset  . Hypertension Father   . Diabetes Sister   . Cancer Sister     breast  .  Breast cancer Sister 8550  . Diabetes Brother   . Cancer Sister     colon  . Breast cancer Other     Social History Social History  Substance Use Topics  . Smoking status: Never Smoker  . Smokeless tobacco: Never Used  . Alcohol use No    Review of Systems  Constitutional: Negative for fever. Cardiovascular: Negative for chest pain. Respiratory: Negative for shortness of breath. Gastrointestinal: Positive for abdominal pain. Genitourinary: Negative for dysuria. Musculoskeletal:  Negative for back pain. Skin: Negative for rash. Neurological: Positive for headache.  10-point ROS otherwise negative.  ____________________________________________   PHYSICAL EXAM:  VITAL SIGNS: ED Triage Vitals  Enc Vitals Group     BP 11/28/16 0609 123/87     Pulse Rate 11/28/16 0609 85     Resp 11/28/16 0609 18     Temp 11/28/16 0609 97.5 F (36.4 C)     Temp Source 11/28/16 0609 Oral     SpO2 11/28/16 0609 95 %     Weight 11/28/16 0606 162 lb (73.5 kg)     Height 11/28/16 0606 5\' 1"  (1.549 m)     Head Circumference --      Peak Flow --      Pain Score 11/28/16 0605 6   Constitutional: Alert and oriented. Well appearing and in no distress. Eyes: Conjunctivae are normal. Normal extraocular movements. ENT   Head: Normocephalic and atraumatic. No battle sign. No racoon eyes. No hemotympanum.    Nose: No congestion/rhinnorhea.   Mouth/Throat: Mucous membranes are moist.   Neck: No stridor. No midline tenderness. Hematological/Lymphatic/Immunilogical: No cervical lymphadenopathy. Cardiovascular: Normal rate, regular rhythm.  No murmurs, rubs, or gallops.  Respiratory: Normal respiratory effort without tachypnea nor retractions. Breath sounds are clear and equal bilaterally. No wheezes/rales/rhonchi. Gastrointestinal: Soft and non tender. No rebound. No guarding.  Genitourinary: Deferred Musculoskeletal: Normal range of motion in all extremities.  Neurologic:  Normal speech and language. No gross focal neurologic deficits are appreciated.  Skin:  Skin is warm, dry and intact. No rash noted. Psychiatric: Mood and affect are normal. Speech and behavior are normal. Patient exhibits appropriate insight and judgment.  ____________________________________________    LABS (pertinent positives/negatives)  None  ____________________________________________   EKG  None  ____________________________________________     RADIOLOGY  None  ____________________________________________   PROCEDURES  Procedures  ____________________________________________   INITIAL IMPRESSION / ASSESSMENT AND PLAN / ED COURSE  Pertinent labs & imaging results that were available during my care of the patient were reviewed by me and considered in my medical decision making (see chart for details).  Patient presented to the emergency department today because of concerns for continued headache after minor head injury 4 days ago. The patient has not tried any Tylenol or ibuprofen for the pain. On exam no signs of any traumatic injury. At this point patient would not benefit from CT scan given lack of any other symptoms. In terms of the abdominal pain it was brief episode that is no longer present. Abdominal exam is benign. I did discuss abdominal return precautions including vomiting, bloody stool or diarrhea, fevers, worsening or severe pain. Did encourage patient to follow-up with her primary care doctor.  ____________________________________________   FINAL CLINICAL IMPRESSION(S) / ED DIAGNOSES  Final diagnoses:  Nonintractable headache, unspecified chronicity pattern, unspecified headache type     Note: This dictation was prepared with Dragon dictation. Any transcriptional errors that result from this process are unintentional     Phineas SemenGraydon Dashiell Franchino, MD  11/28/16 0725  

## 2016-11-28 NOTE — ED Notes (Addendum)
Pt resting in bed, resp even and unlabored, lights dimmed, pt states she came up to fast from bending down and hit her left forehead on the washer, states continued headache, pt states she has not taken any OTC meds, awake and alert, denies any use of blood thinners

## 2016-12-31 ENCOUNTER — Encounter: Admission: RE | Payer: Self-pay | Source: Ambulatory Visit

## 2016-12-31 ENCOUNTER — Ambulatory Visit
Admission: RE | Admit: 2016-12-31 | Payer: BLUE CROSS/BLUE SHIELD | Source: Ambulatory Visit | Admitting: Gastroenterology

## 2016-12-31 SURGERY — COLONOSCOPY
Anesthesia: General

## 2017-05-20 ENCOUNTER — Ambulatory Visit
Admission: RE | Admit: 2017-05-20 | Payer: BLUE CROSS/BLUE SHIELD | Source: Ambulatory Visit | Admitting: Gastroenterology

## 2017-05-20 ENCOUNTER — Encounter: Admission: RE | Payer: Self-pay | Source: Ambulatory Visit

## 2017-05-20 SURGERY — COLONOSCOPY WITH PROPOFOL
Anesthesia: General

## 2018-01-17 ENCOUNTER — Emergency Department (HOSPITAL_COMMUNITY): Payer: Medicare HMO

## 2018-01-17 ENCOUNTER — Encounter (HOSPITAL_COMMUNITY): Payer: Self-pay

## 2018-01-17 ENCOUNTER — Other Ambulatory Visit: Payer: Self-pay

## 2018-01-17 ENCOUNTER — Emergency Department (HOSPITAL_COMMUNITY)
Admission: EM | Admit: 2018-01-17 | Discharge: 2018-01-17 | Disposition: A | Discharge status: Home / Self Care | Payer: Medicare HMO | Attending: Emergency Medicine | Admitting: Emergency Medicine

## 2018-01-17 DIAGNOSIS — J029 Acute pharyngitis, unspecified: Secondary | ICD-10-CM | POA: Diagnosis not present

## 2018-01-17 DIAGNOSIS — I10 Essential (primary) hypertension: Secondary | ICD-10-CM | POA: Insufficient documentation

## 2018-01-17 DIAGNOSIS — Z79899 Other long term (current) drug therapy: Secondary | ICD-10-CM | POA: Diagnosis not present

## 2018-01-17 DIAGNOSIS — R51 Headache: Secondary | ICD-10-CM | POA: Diagnosis not present

## 2018-01-17 DIAGNOSIS — Z7982 Long term (current) use of aspirin: Secondary | ICD-10-CM | POA: Diagnosis not present

## 2018-01-17 DIAGNOSIS — R519 Headache, unspecified: Secondary | ICD-10-CM

## 2018-01-17 LAB — GROUP A STREP BY PCR: Group A Strep by PCR: NOT DETECTED

## 2018-01-17 IMAGING — CT CT HEAD W/O CM
3 series · 16 of 46 positions shown, 19 images · non-contrast
Comparison: None.

CLINICAL DATA: Headache, sore throat.  History of hypertension.

EXAM:
CT HEAD WITHOUT CONTRAST
TECHNIQUE: Contiguous axial images were obtained from the base of the skull
through the vertex without intravenous contrast.

[Series 2: head wo · axial · 0.42mm/px · z∈[+1370,+1490]mm · 10 of 29 slices shown, 13 images]
[im 3/29  brain]
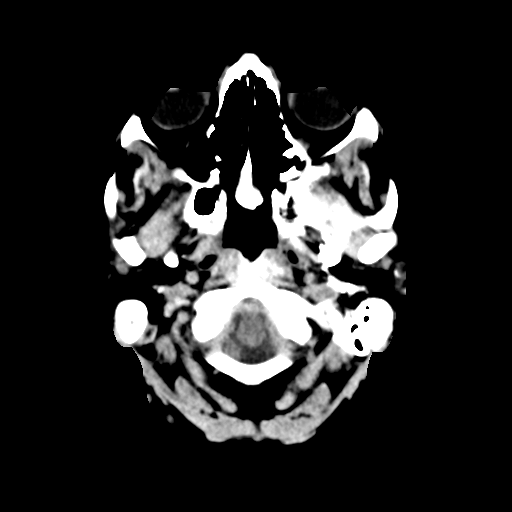
[im 3/29  bone]
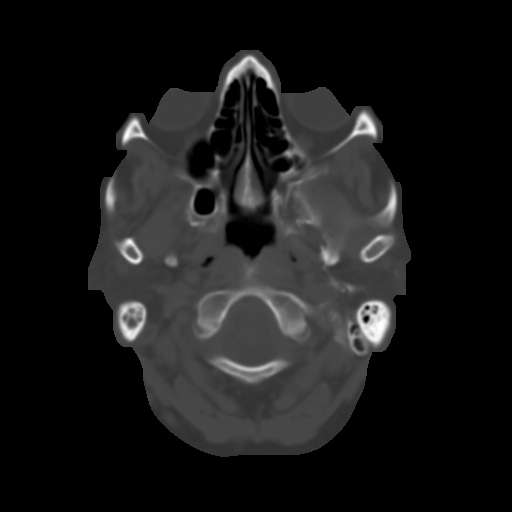
[im 6/29  brain]
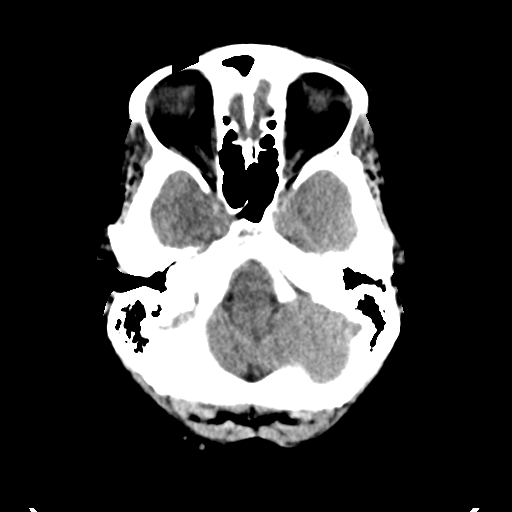
[im 8/29  brain]
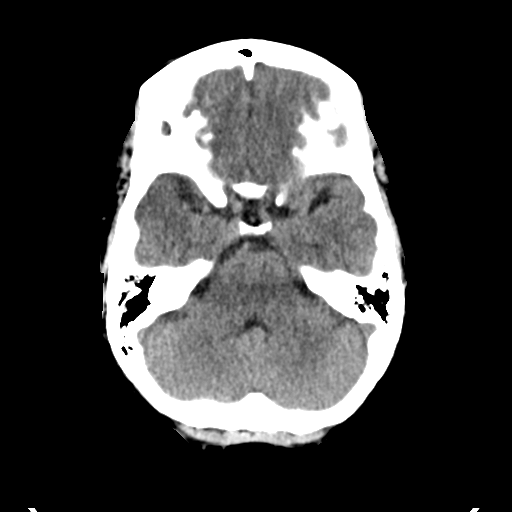
[im 11/29  brain]
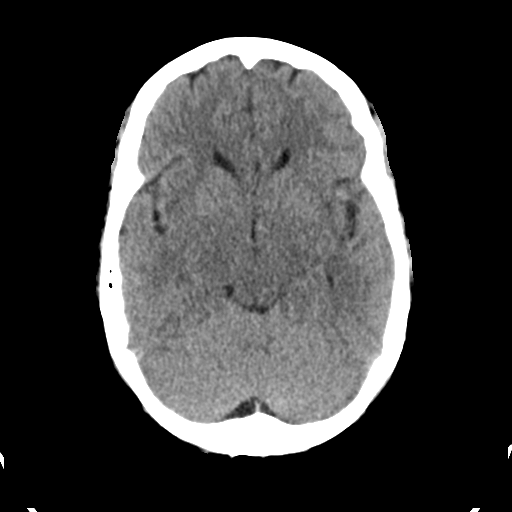
[im 14/29  brain]
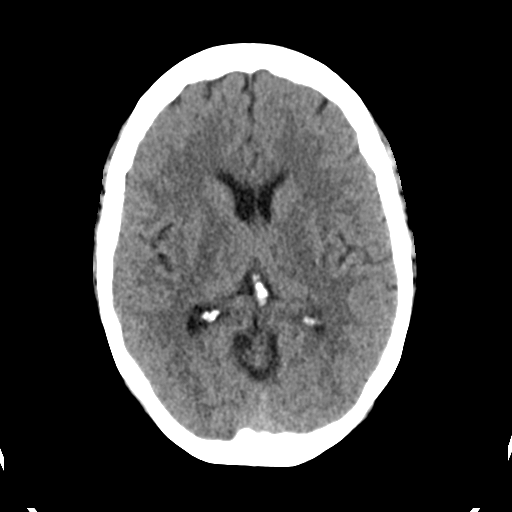
[im 14/29  bone]
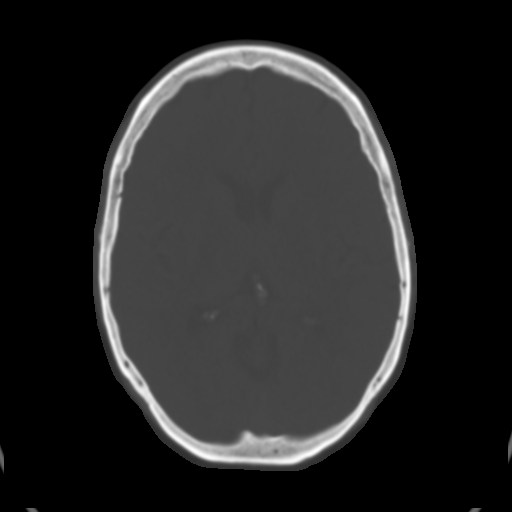
[im 16/29  brain]
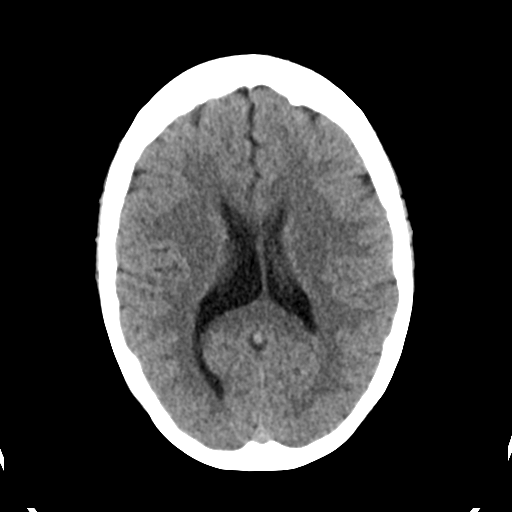
[im 19/29  brain]
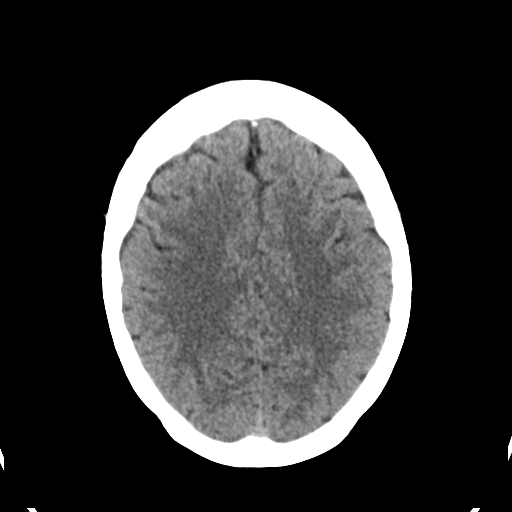
[im 22/29  brain]
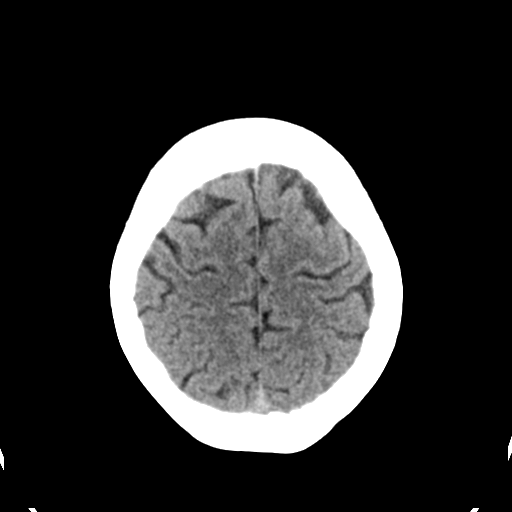
[im 24/29  brain]
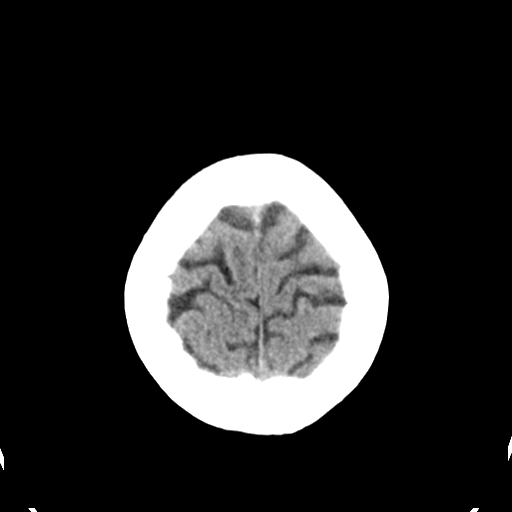
[im 24/29  bone]
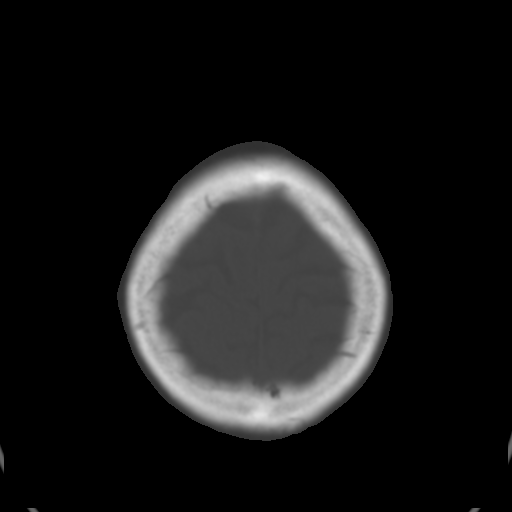
[im 27/29  brain]
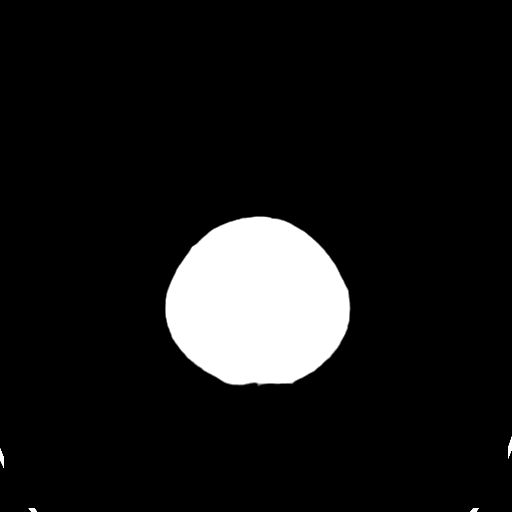

[Series 4: coronal soft tissue · coronal · 0.32mm/px · 3 of 67 slices shown]
[im 23/67  brain]
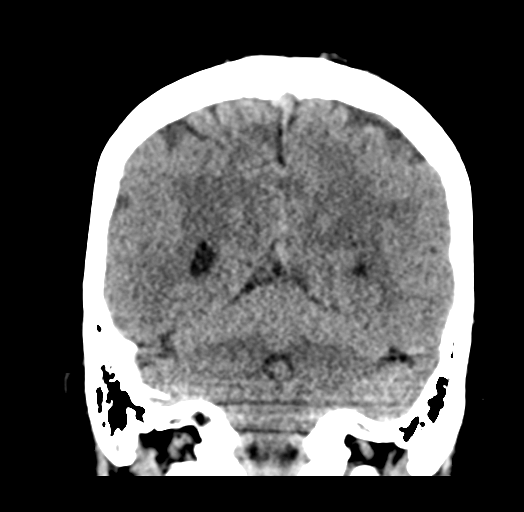
[im 30/67  brain]
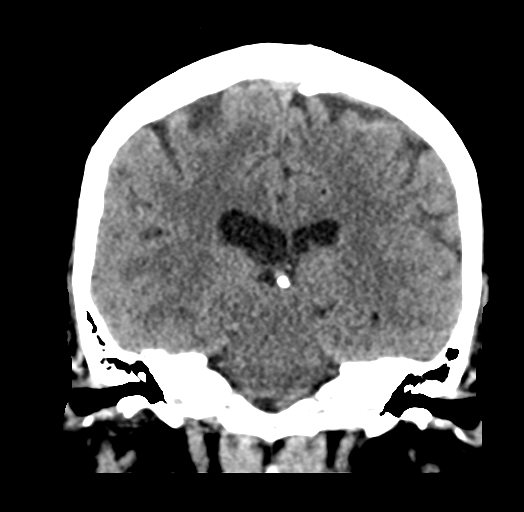
[im 37/67  brain]
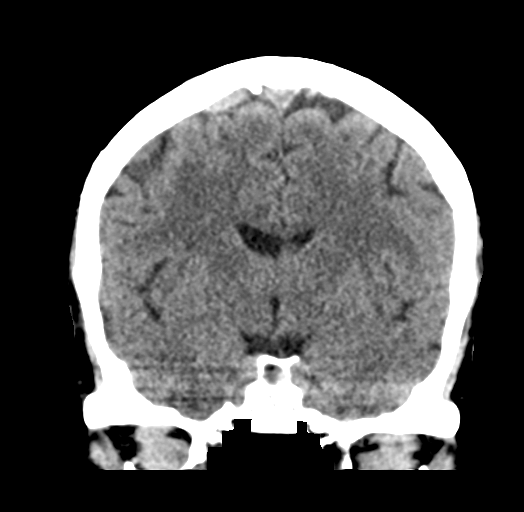

[Series 5: sagittal soft tissue · sagittal · 0.31mm/px · 3 of 55 slices shown]
[im 19/55  brain]
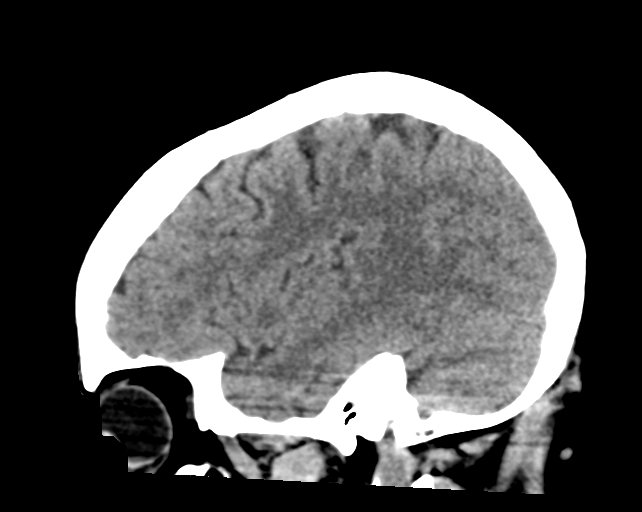
[im 28/55  brain]
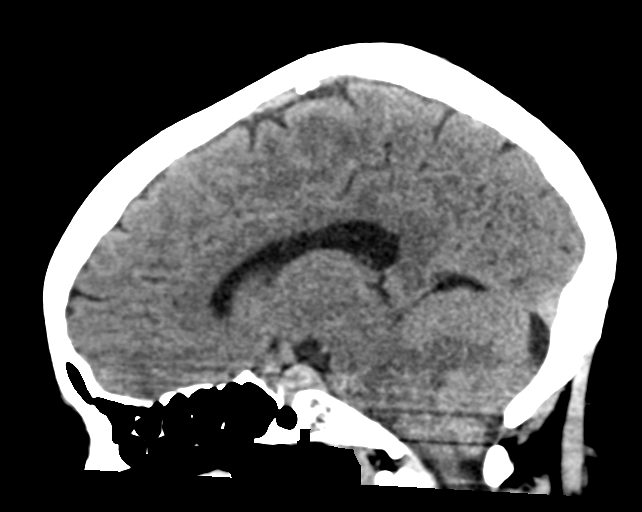
[im 37/55  brain]
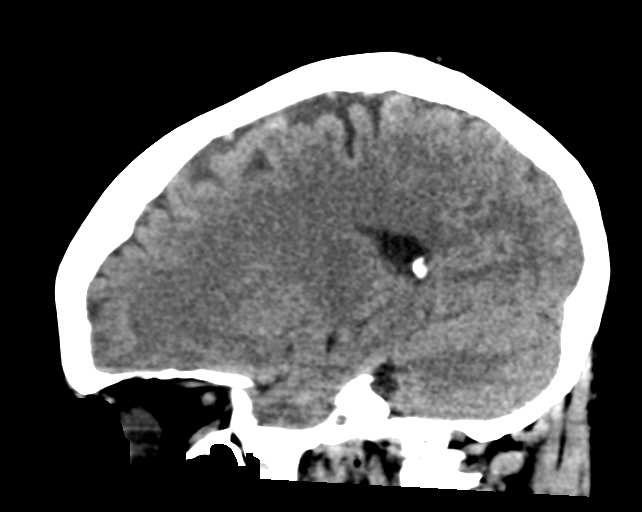

[16 of 46 positions shown; findings below may reference images not displayed]

FINDINGS: BRAIN: No intraparenchymal hemorrhage, mass effect nor midline
shift. The ventricles and sulci are normal. No acute large vascular
territory infarcts. No abnormal extra-axial fluid collections. Basal
cisterns are patent.

VASCULAR: Unremarkable.

SKULL/SOFT TISSUES: No skull fracture. No significant soft tissue
swelling.

ORBITS/SINUSES: The included ocular globes and orbital contents are
normal.Trace bilateral mastoid effusions. Included paranasal sinuses
are well aerated.

OTHER: None.
IMPRESSION: Normal noncontrast CT HEAD.

## 2018-01-17 MED ORDER — NYSTATIN-TRIAMCINOLONE 100000-0.1 UNIT/GM-% EX CREA: TOPICAL_CREAM | CUTANEOUS | 0 refills | Status: DC

## 2018-01-17 MED ORDER — IBUPROFEN 800 MG PO TABS
800.0000 mg | ORAL_TABLET | Freq: Once | ORAL | Status: AC
  Administered 2018-01-17: 800 mg via ORAL
  Filled 2018-01-17: qty 1

## 2018-01-17 NOTE — ED Provider Notes (Signed)
[facility] EMERGENCY DEPARTMENT Provider Note   CSN: [id number] Arrival date & time: 01/17/18  0156     History   Chief Complaint Chief Complaint  Patient presents with  . Sore Throat    HPI Jennifer Rubio is a 67 y.o. female.  Patient is a 67 year old female with history of hypertension, osteoarthritis presenting for evaluation of sore throat, headache, not feeling well.  This is been ongoing for the past 2 days.  She denies any fevers or chills.  She denies any injury or trauma.  She denies any ill contacts.  She reports this headache is unusual, however she has not taken any medications to see if this will help.  The history is provided by the patient.  Sore Throat  This is a new problem. The current episode started 2 days ago. The problem occurs constantly. The problem has been gradually worsening. Associated symptoms include headaches. Nothing aggravates the symptoms. Nothing relieves the symptoms. She has tried nothing for the symptoms.    Past Medical History:  Diagnosis Date  . Cellulitis and abscess of leg January 2013  . Hypertension   . Osteoarthritis   . Urinary tract infection January 2013    Patient Active Problem List   Diagnosis Date Noted  . Muscle strain of left lower extremity 06/29/2015  . Anemia 10/21/2011  . UTI (lower urinary tract infection) 10/21/2011  . Cellulitis of knee, left 10/19/2011  . HTN (hypertension), benign 10/19/2011  . Obesity 10/19/2011  . Osteoarthritis 10/19/2011  . Hyperglycemia 10/19/2011    Past Surgical History:  Procedure Laterality Date  . ABDOMINAL HYSTERECTOMY    . BLADDER SUSPENSION  11/26/2011   Procedure: TRANSVAGINAL TAPE (TVT) PROCEDURE;  Surgeon: Genia Del, MD;  Location: [facility];  Service: Gynecology;  Laterality: N/A;  . BREAST CYST ASPIRATION Right   . BUNIONECTOMY    . CYSTOSCOPY  11/26/2011   Procedure: CYSTOSCOPY;  Surgeon: Genia Del, MD;  Location: [facility];  Service: Gynecology;   Laterality: N/A;  . KNEE ARTHROSCOPY     both knees  . RECTOCELE REPAIR  11/26/2011   Procedure: POSTERIOR REPAIR (RECTOCELE);  Surgeon: Genia Del, MD;  Location: [facility];  Service: Gynecology;  Laterality: N/A;  . ROBOTIC ASSISTED LAPAROSCOPIC SACROCOLPOPEXY  11/26/2011   Procedure: ROBOTIC ASSISTED LAPAROSCOPIC SACROCOLPOPEXY;  Surgeon: Genia Del, MD;  Location: [facility];  Service: Gynecology;  Laterality: N/A;     OB History    Gravida  2   Para  2   Term  2   Preterm      AB      Living  2     SAB      TAB      Ectopic      Multiple      Live Births               Home Medications    Prior to Admission medications   Medication Sig Start Date End Date Taking? Authorizing Provider  aspirin EC 81 MG tablet Take 81 mg by mouth.    [provider]  cyclobenzaprine (FLEXERIL) 10 MG tablet Take 1 tablet (10 mg total) by mouth at bedtime. 06/29/15   Gabriel Cirri, NP  HYDROcodone-acetaminophen (NORCO) 5-325 MG tablet Take 1 tablet by mouth every 6 (six) hours as needed for moderate pain. 11/11/16   Irean Hong, MD  ibuprofen (ADVIL,MOTRIN) 800 MG tablet Take 1 tablet (800 mg total) by mouth every 8 (eight) hours as needed  for moderate pain. 11/11/16   Irean Hong, MD  losartan-hydrochlorothiazide (HYZAAR) 50-12.5 MG tablet TAKE 1 TABLET BY MOUTH DAILY. 12/26/15   Gabriel Cirri, NP  methocarbamol (ROBAXIN) 500 MG tablet Take 1 tablet (500 mg total) by mouth 2 (two) times daily as needed. 07/27/16   Barrett Henle, PA-C  tizanidine (ZANAFLEX) 2 MG capsule Take 2 mg by mouth as needed for muscle spasms.    [provider]    Family History Family History  Problem Relation Age of Onset  . Hypertension Father   . Diabetes Sister   . Cancer Sister        breast  . Breast cancer Sister 51  . Diabetes Brother   . Cancer Sister        colon  . Breast cancer Other     Social History Social History   Tobacco Use  . Smoking  status: Never Smoker  . Smokeless tobacco: Never Used  Substance Use Topics  . Alcohol use: No  . Drug use: No     Allergies   Sulfa antibiotics   Review of Systems Review of Systems  Neurological: Positive for headaches.  All other systems reviewed and are negative.    Physical Exam Updated Vital Signs BP (!) 129/94 (BP Location: Right Arm)   Pulse 99   Temp 98.4 F (36.9 C) (Oral)   Resp 18   Ht 5\' 1"  (1.549 m)   Wt 74.8 kg (165 lb)   LMP  (LMP Unknown)   SpO2 97%   BMI 31.18 kg/m   Physical Exam  Constitutional: She is oriented to person, place, and time. She appears well-developed and well-nourished. No distress.  HENT:  Head: Normocephalic and atraumatic.  Right Ear: Tympanic membrane normal.  Left Ear: Tympanic membrane normal.  Mouth/Throat: Uvula is midline. No uvula swelling. Posterior oropharyngeal erythema present. No tonsillar abscesses. No tonsillar exudate.  Neck: Normal range of motion. Neck supple.  Cardiovascular: Normal rate and regular rhythm. Exam reveals no gallop and no friction rub.  No murmur heard. Pulmonary/Chest: Effort normal and breath sounds normal. No respiratory distress. She has no wheezes.  Abdominal: Soft. Bowel sounds are normal. She exhibits no distension. There is no tenderness.  Musculoskeletal: Normal range of motion.  Neurological: She is alert and oriented to person, place, and time.  Skin: Skin is warm and dry. She is not diaphoretic.  Nursing note and vitals reviewed.    ED Treatments / Results  Labs (all labs ordered are listed, but only abnormal results are displayed) Labs Reviewed  GROUP A STREP BY PCR    EKG None  Radiology No results found.  Procedures Procedures (including critical care time)  Medications Ordered in ED Medications  ibuprofen (ADVIL,MOTRIN) tablet 800 mg (has no administration in time range)     Initial Impression / Assessment and Plan / ED Course  I have reviewed the triage  vital signs and the nursing notes.  Pertinent labs & imaging results that were available during my care of the patient were reviewed by me and considered in my medical decision making (see chart for details).  Patient presents with complaints of sore throat and headache.  Her strep test is negative and head CT is unremarkable with normal neurologic exam.  I suspect her symptoms are viral in nature.  She was given ibuprofen and will be discharged, to return as needed for any problems.  Final Clinical Impressions(s) / ED Diagnoses   Final diagnoses:  None  ED Discharge Orders    None       Geoffery Lyonselo, Yahel Fuston, MD 01/17/18 [phone]

## 2018-01-17 NOTE — ED Triage Notes (Signed)
Pt reports sore throat and ha since yesterday.  Pt denies fever at home, no vomiting or diarrhea

## 2018-01-17 NOTE — Discharge Instructions (Addendum)
Ibuprofen 600 mg every 6 hours as needed for pain.  Drink plenty of fluids and get plenty of rest.  Follow-up with your primary doctor if symptoms are not improving in the next 3 to 4 days, and return to the ER if symptoms significantly worsen or change.

## 2018-10-18 ENCOUNTER — Emergency Department
Admission: EM | Admit: 2018-10-18 | Discharge: 2018-10-18 | Disposition: A | Discharge status: Home / Self Care | Payer: Medicare HMO | Attending: Emergency Medicine | Admitting: Emergency Medicine

## 2018-10-18 ENCOUNTER — Other Ambulatory Visit: Payer: Self-pay

## 2018-10-18 DIAGNOSIS — R21 Rash and other nonspecific skin eruption: Secondary | ICD-10-CM | POA: Diagnosis present

## 2018-10-18 DIAGNOSIS — Z7982 Long term (current) use of aspirin: Secondary | ICD-10-CM | POA: Diagnosis not present

## 2018-10-18 DIAGNOSIS — L259 Unspecified contact dermatitis, unspecified cause: Secondary | ICD-10-CM | POA: Insufficient documentation

## 2018-10-18 DIAGNOSIS — I10 Essential (primary) hypertension: Secondary | ICD-10-CM | POA: Insufficient documentation

## 2018-10-18 DIAGNOSIS — Z79899 Other long term (current) drug therapy: Secondary | ICD-10-CM | POA: Diagnosis not present

## 2018-10-18 MED ORDER — DEXAMETHASONE SODIUM PHOSPHATE 10 MG/ML IJ SOLN
10.0000 mg | Freq: Once | INTRAMUSCULAR | Status: AC
  Administered 2018-10-18: 10 mg via INTRAMUSCULAR
  Filled 2018-10-18: qty 1

## 2018-10-18 MED ORDER — FAMOTIDINE 20 MG PO TABS: 20.0000 mg | ORAL_TABLET | Freq: Two times a day (BID) | ORAL | 0 refills | Status: DC

## 2018-10-18 MED ORDER — DIPHENHYDRAMINE HCL 25 MG PO CAPS: 25.0000 mg | ORAL_CAPSULE | Freq: Four times a day (QID) | ORAL | 0 refills | Status: DC | PRN

## 2018-10-18 MED ORDER — FAMOTIDINE 20 MG PO TABS: 40.0000 mg | ORAL_TABLET | Freq: Once | ORAL | Status: DC

## 2018-10-18 NOTE — ED Triage Notes (Signed)
Pt to the er for an allergic reaction. Pt denies any new creams. Pt reports itching. Rash started on neck last night and is now present on her face and behind her ears. Pt has no difficulty breathing or swallowing.

## 2018-10-18 NOTE — ED Provider Notes (Signed)
[facility] Emergency Department Provider Note  ____________________________________________  Time seen: Approximately 10:50 PM  I have reviewed the triage vital signs and the nursing notes.   HISTORY  Chief Complaint Rash and Allergic Reaction    HPI Jennifer Rubio is a 68 y.o. female presents to the emergency department with a pruritic rash along the neck and face that has been present for the past 2 to 3 days.  Patient denies new exposures and cannot identify a precipitating triggers.  Patient denies shortness of breath, chest tightness, nausea or vomiting.  Patient denies known allergies and has never had anaphylaxis in the past.   Past Medical History:  Diagnosis Date  . Cellulitis and abscess of leg January 2013  . Hypertension   . Osteoarthritis   . Urinary tract infection January 2013    Patient Active Problem List   Diagnosis Date Noted  . Muscle strain of left lower extremity 06/29/2015  . Anemia 10/21/2011  . UTI (lower urinary tract infection) 10/21/2011  . Cellulitis of knee, left 10/19/2011  . HTN (hypertension), benign 10/19/2011  . Obesity 10/19/2011  . Osteoarthritis 10/19/2011  . Hyperglycemia 10/19/2011    Past Surgical History:  Procedure Laterality Date  . ABDOMINAL HYSTERECTOMY    . BLADDER SUSPENSION  11/26/2011   Procedure: TRANSVAGINAL TAPE (TVT) PROCEDURE;  Surgeon: Genia DelMarie-Lyne Lavoie, MD;  Location: [facility];  Service: Gynecology;  Laterality: N/A;  . BREAST CYST ASPIRATION Right   . BUNIONECTOMY    . CYSTOSCOPY  11/26/2011   Procedure: CYSTOSCOPY;  Surgeon: Genia DelMarie-Lyne Lavoie, MD;  Location: [facility];  Service: Gynecology;  Laterality: N/A;  . KNEE ARTHROSCOPY     both knees  . RECTOCELE REPAIR  11/26/2011   Procedure: POSTERIOR REPAIR (RECTOCELE);  Surgeon: Genia DelMarie-Lyne Lavoie, MD;  Location: [facility];  Service: Gynecology;  Laterality: N/A;  . ROBOTIC ASSISTED LAPAROSCOPIC SACROCOLPOPEXY  11/26/2011   Procedure: ROBOTIC ASSISTED  LAPAROSCOPIC SACROCOLPOPEXY;  Surgeon: Genia DelMarie-Lyne Lavoie, MD;  Location: [facility];  Service: Gynecology;  Laterality: N/A;    Prior to Admission medications   Medication Sig Start Date End Date Taking? Authorizing Provider  aspirin EC 81 MG tablet Take 81 mg by mouth.    [provider]  cyclobenzaprine (FLEXERIL) 10 MG tablet Take 1 tablet (10 mg total) by mouth at bedtime. 06/29/15   Gabriel CirriWicker, Cheryl, NP  diphenhydrAMINE (BENADRYL ALLERGY) 25 mg capsule Take 1 capsule (25 mg total) by mouth every 6 (six) hours as needed for up to 5 days. 10/18/18 10/23/18  Orvil FeilWoods, Rolla Kedzierski M, PA-C  famotidine (PEPCID) 20 MG tablet Take 1 tablet (20 mg total) by mouth 2 (two) times daily for 5 days. 10/18/18 10/23/18  Orvil FeilWoods, Demarquez Ciolek M, PA-C  HYDROcodone-acetaminophen (NORCO) 5-325 MG tablet Take 1 tablet by mouth every 6 (six) hours as needed for moderate pain. 11/11/16   Irean HongSung, Jade J, MD  ibuprofen (ADVIL,MOTRIN) 800 MG tablet Take 1 tablet (800 mg total) by mouth every 8 (eight) hours as needed for moderate pain. 11/11/16   Irean HongSung, Jade J, MD  losartan-hydrochlorothiazide (HYZAAR) 50-12.5 MG tablet TAKE 1 TABLET BY MOUTH DAILY. 12/26/15   Gabriel CirriWicker, Cheryl, NP  methocarbamol (ROBAXIN) 500 MG tablet Take 1 tablet (500 mg total) by mouth 2 (two) times daily as needed. 07/27/16   Barrett HenleNadeau, Nicole Elizabeth, PA-C  nystatin-triamcinolone (MYCOLOG II) cream Apply to affected area daily 01/17/18   Geoffery Lyonselo, Douglas, MD  tizanidine (ZANAFLEX) 2 MG capsule Take 2 mg by mouth as needed for muscle spasms.  [provider]    Allergies Sulfa antibiotics  Family History  Problem Relation Age of Onset  . Hypertension Father   . Diabetes Sister   . Cancer Sister        breast  . Breast cancer Sister 4  . Diabetes Brother   . Cancer Sister        colon  . Breast cancer Other     Social History Social History   Tobacco Use  . Smoking status: Never Smoker  . Smokeless tobacco: Never Used  Substance Use Topics  .  Alcohol use: No  . Drug use: No     Review of Systems  Constitutional: No fever/chills Eyes: No visual changes. No discharge ENT: No upper respiratory complaints. Cardiovascular: no chest pain. Respiratory: no cough. No SOB. Gastrointestinal: No abdominal pain.  No nausea, no vomiting.  No diarrhea.  No constipation. Genitourinary: Negative for dysuria. No hematuria Musculoskeletal: Negative for musculoskeletal pain. Skin: Patient has rash.  Neurological: Negative for headaches, focal weakness or numbness. .  ____________________________________________   PHYSICAL EXAM:  VITAL SIGNS: ED Triage Vitals [10/18/18 2216]  Enc Vitals Group     BP 116/76     Pulse Rate 74     Resp 18     Temp 97.7 F (36.5 C)     Temp Source Oral     SpO2 100 %     Weight 154 lb (69.9 kg)     Height 5' 1.5" (1.562 m)     Head Circumference      Peak Flow      Pain Score 0     Pain Loc      Pain Edu?      Excl. in GC?      Constitutional: Alert and oriented. Well appearing and in no acute distress. Eyes: Conjunctivae are normal. PERRL. EOMI. Head: Atraumatic. ENT:      Ears: TMs are pearly.      Nose: No congestion/rhinnorhea.      Mouth/Throat: Mucous membranes are moist.  Neck: No stridor.  No cervical spine tenderness to palpation. Hematological/Lymphatic/Immunilogical: No cervical lymphadenopathy. Cardiovascular: Normal rate, regular rhythm. Normal S1 and S2.  Good peripheral circulation. Respiratory: Normal respiratory effort without tachypnea or retractions. Lungs CTAB. Good air entry to the bases with no decreased or absent breath sounds. Gastrointestinal: Bowel sounds 4 quadrants. Soft and nontender to palpation. No guarding or rigidity. No palpable masses. No distention. No CVA tenderness. Musculoskeletal: Full range of motion to all extremities. No gross deformities appreciated. Neurologic:  Normal speech and language. No gross focal neurologic deficits are appreciated.   Skin: Patient has erythematous, maculopapular rash along neck and face. Psychiatric: Mood and affect are normal. Speech and behavior are normal. Patient exhibits appropriate insight and judgement.   ____________________________________________   LABS (all labs ordered are listed, but only abnormal results are displayed)  Labs Reviewed - No data to display ____________________________________________  EKG   ____________________________________________  RADIOLOGY   No results found.  ____________________________________________    PROCEDURES  Procedure(s) performed:    Procedures    Medications  dexamethasone (DECADRON) injection 10 mg (10 mg Intramuscular Given 10/18/18 2245)     ____________________________________________   INITIAL IMPRESSION / ASSESSMENT AND PLAN / ED COURSE  Pertinent labs & imaging results that were available during my care of the patient were reviewed by me and considered in my medical decision making (see chart for details).  Review of the [place] CSRS was performed in accordance  of the [facility] prior to dispensing any controlled drugs.      Assessment and plan Contact dermatitis Patient presents to the emergency department with an erythematous, maculopapular rash of the face and neck that has been present for the past several days.  On physical exam, vitals were reassuring and patient had no increased work of breathing or complaints of chest tightness, chest pain, nausea, vomiting or abdominal pain.  Patient was given Decadron in the emergency department.  Patient was discharged with Benadryl, famotidine and Pepcid.  Strict return precautions were given to return to the emergency department for new or worsening symptoms.  All patient questions were answered.    ____________________________________________  FINAL CLINICAL IMPRESSION(S) / ED DIAGNOSES  Final diagnoses:  Contact dermatitis, unspecified contact dermatitis type, unspecified  trigger      NEW MEDICATIONS STARTED DURING THIS VISIT:  ED Discharge Orders         Ordered    diphenhydrAMINE (BENADRYL ALLERGY) 25 mg capsule  Every 6 hours PRN     10/18/18 2245    famotidine (PEPCID) 20 MG tablet  2 times daily     10/18/18 2245              This chart was dictated using voice recognition software/Dragon. Despite best efforts to proofread, errors can occur which can change the meaning. Any change was purely unintentional.    Orvil FeilWoods, Saranda Legrande M, PA-C 10/18/18 2255    Don PerkingVeronese, [place], MD 10/19/18 [phone]

## 2019-04-17 ENCOUNTER — Other Ambulatory Visit: Payer: Self-pay

## 2019-04-17 ENCOUNTER — Ambulatory Visit (LOCAL_COMMUNITY_HEALTH_CENTER): Payer: Self-pay

## 2019-04-17 DIAGNOSIS — Z111 Encounter for screening for respiratory tuberculosis: Secondary | ICD-10-CM

## 2019-04-20 ENCOUNTER — Other Ambulatory Visit: Payer: Self-pay

## 2019-04-20 ENCOUNTER — Ambulatory Visit (LOCAL_COMMUNITY_HEALTH_CENTER): Payer: Medicare HMO

## 2019-04-20 DIAGNOSIS — Z111 Encounter for screening for respiratory tuberculosis: Secondary | ICD-10-CM

## 2019-04-20 LAB — TB SKIN TEST
Induration: 0 mm
TB Skin Test: NEGATIVE

## 2019-05-05 ENCOUNTER — Other Ambulatory Visit: Payer: Medicare HMO

## 2019-05-06 ENCOUNTER — Other Ambulatory Visit: Payer: Self-pay | Admitting: Neurology

## 2019-05-06 DIAGNOSIS — M542 Cervicalgia: Secondary | ICD-10-CM

## 2019-05-18 ENCOUNTER — Ambulatory Visit
Admission: RE | Admit: 2019-05-18 | Discharge: 2019-05-18 | Disposition: A | Discharge status: Home / Self Care | Payer: Medicare HMO | Source: Ambulatory Visit | Attending: Neurology | Admitting: Neurology

## 2019-05-18 ENCOUNTER — Other Ambulatory Visit: Payer: Self-pay

## 2019-05-18 DIAGNOSIS — M542 Cervicalgia: Secondary | ICD-10-CM | POA: Diagnosis present

## 2019-05-18 IMAGING — MR MRI CERVICAL SPINE WITHOUT CONTRAST
5 series · 39 of 48 positions shown · non-contrast
Comparison: None.

CLINICAL DATA: Right-sided neck and arm pain for 3 months

EXAM:
MRI CERVICAL SPINE WITHOUT CONTRAST
TECHNIQUE: Multiplanar, multisequence MR imaging of the cervical spine was
performed. No intravenous contrast was administered.

[Series 5: T2 · sagittal · 3.0mm · 0.62mm/px · 6 of 15 slices shown (1 of 2)]
[im 1/15]
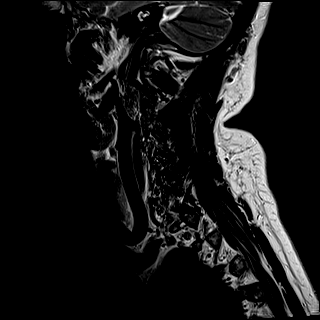
[im 3/15]
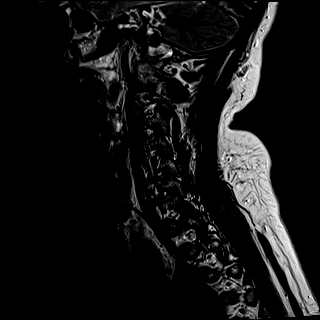
[im 6/15]
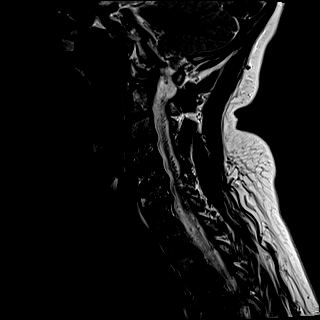
[im 9/15]
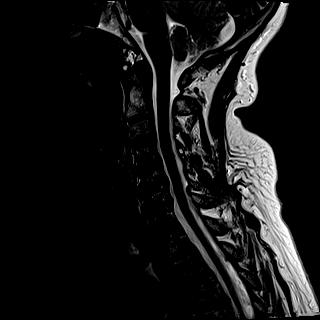
[im 12/15]
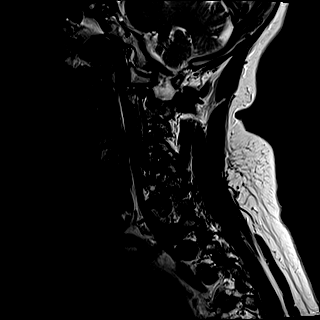
[im 15/15]
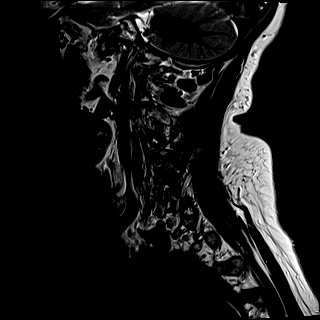

[Series 6: FLAIR · sagittal · 3.0mm · 0.78mm/px · 7 of 15 slices shown]
[im 1/15]
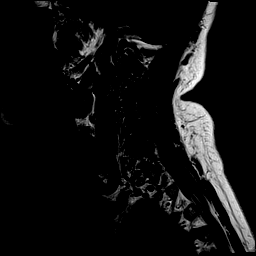
[im 3/15]
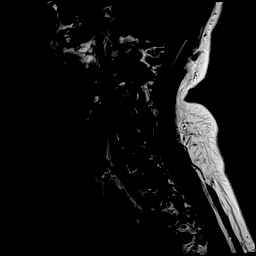
[im 5/15]
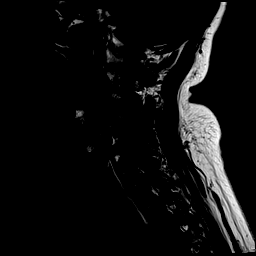
[im 8/15]
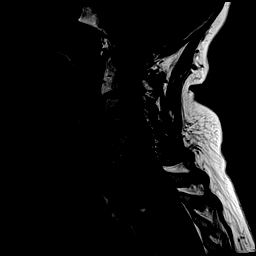
[im 10/15]
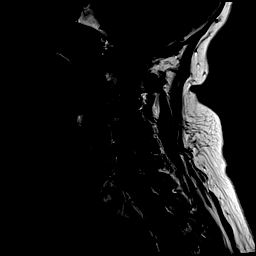
[im 12/15]
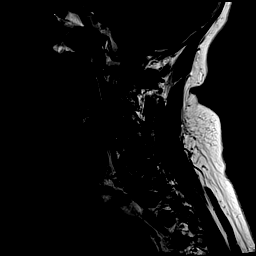
[im 15/15]
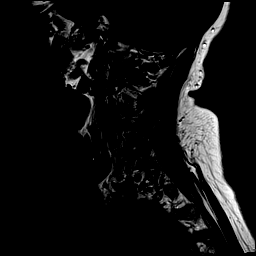

[Series 7: STIR · sagittal · 3.0mm · 0.62mm/px · 7 of 15 slices shown]
[im 1/15]
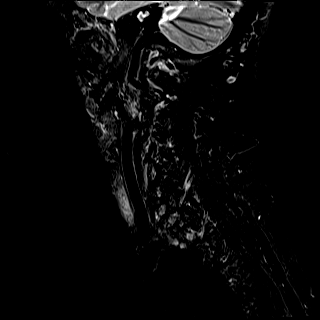
[im 3/15]
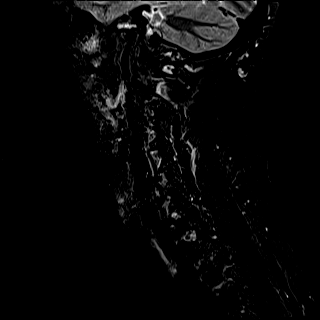
[im 5/15]
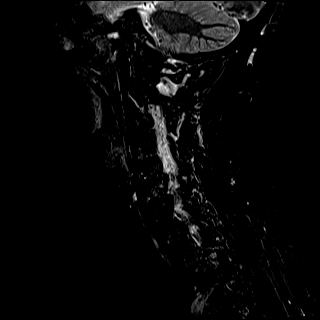
[im 8/15]
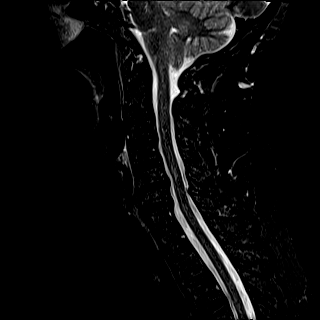
[im 10/15]
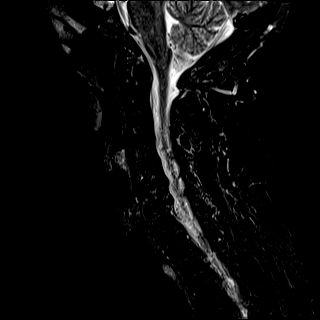
[im 12/15]
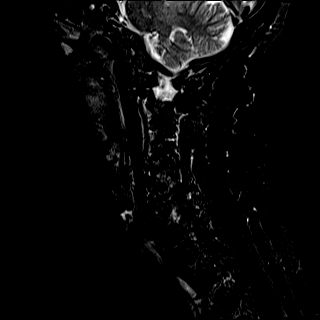
[im 15/15]
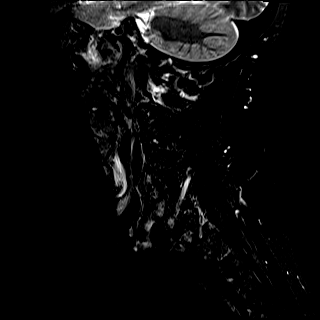

[Series 8: T2 · axial · 3.0mm · 0.70mm/px · z∈[-116,-17]mm · 11 of 30 slices shown (2 of 2)]
[im 1/30]
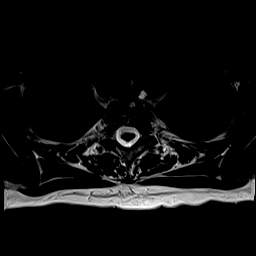
[im 3/30]
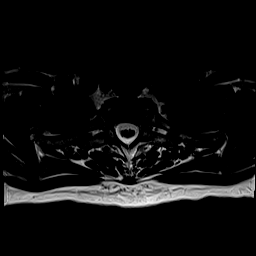
[im 5/30]
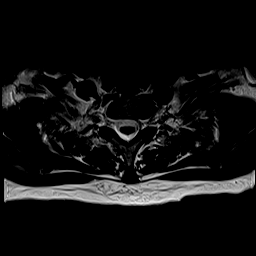
[im 7/30]
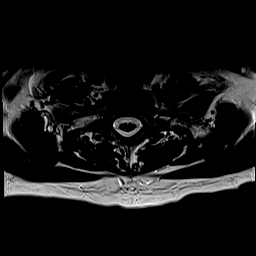
[im 9/30]
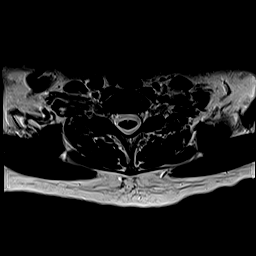
[im 12/30]
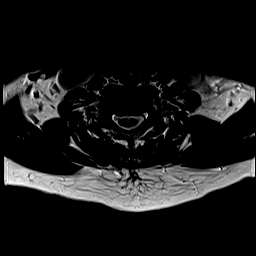
[im 14/30]
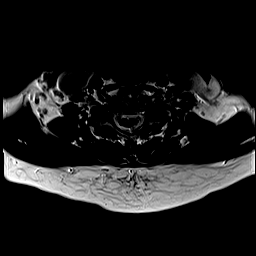
[im 16/30]
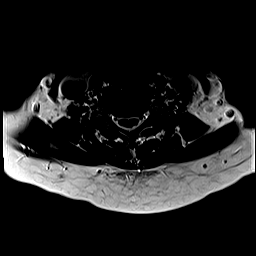
[im 21/30]
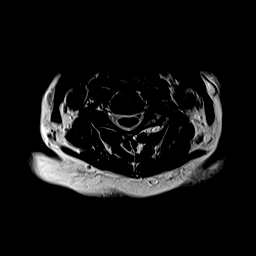
[im 25/30]
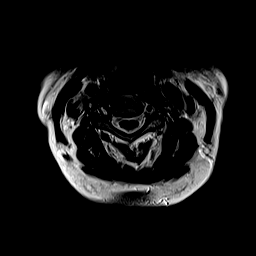
[im 30/30]
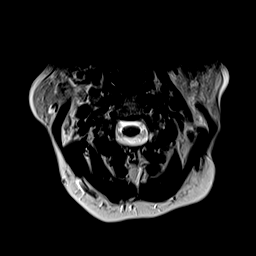

[Series 9: ax mpgr · axial · 3.0mm · 0.35mm/px · z∈[-116,-17]mm · 8 of 30 slices shown]
[im 1/30]
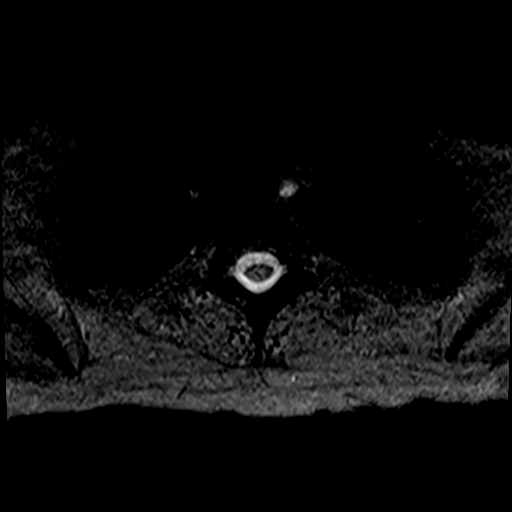
[im 5/30]
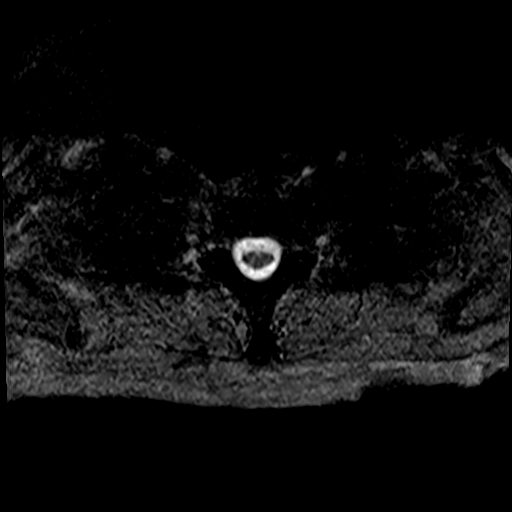
[im 9/30]
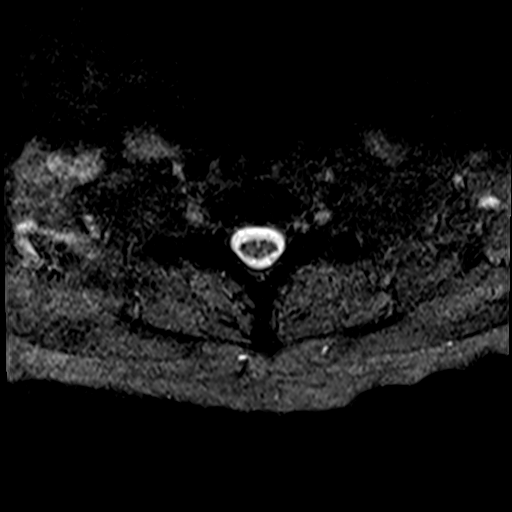
[im 14/30]
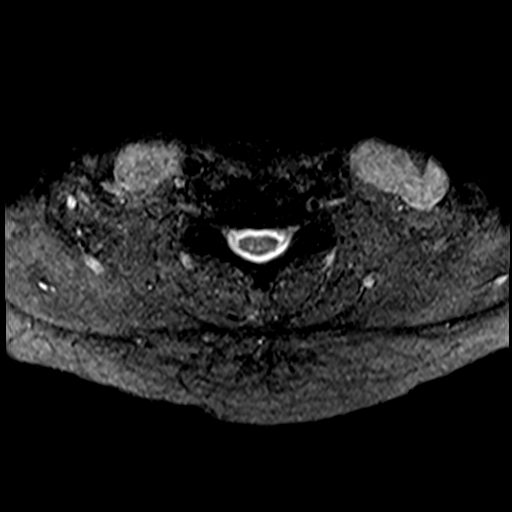
[im 16/30]
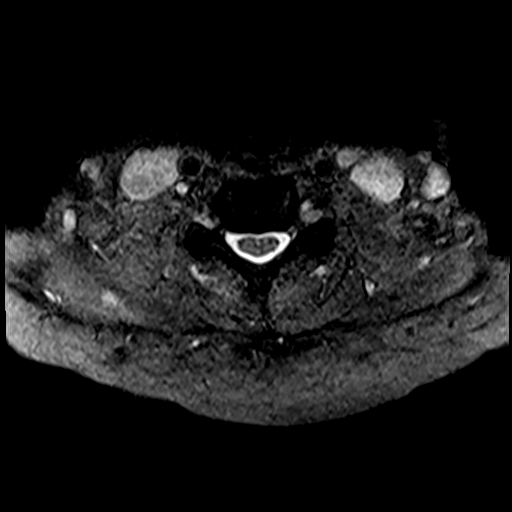
[im 21/30]
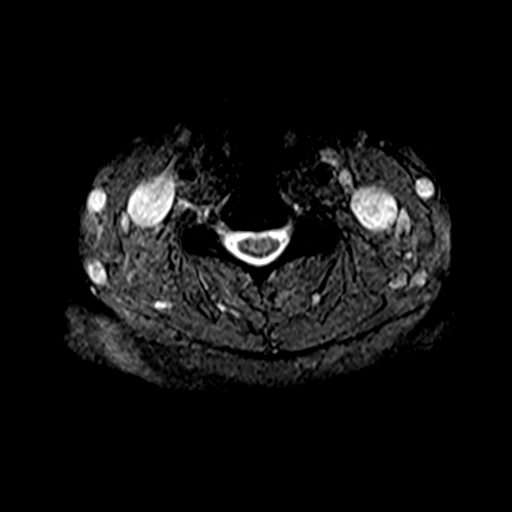
[im 25/30]
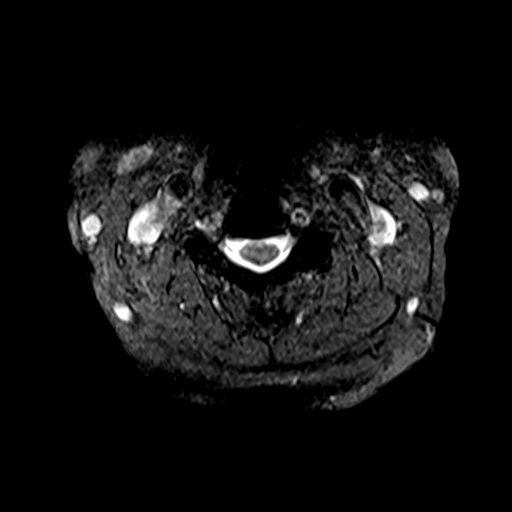
[im 30/30]
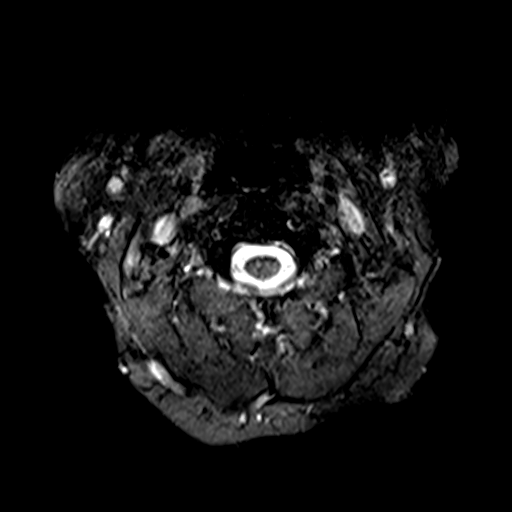

[39 of 48 positions shown; findings below may reference images not displayed]

FINDINGS: Alignment: Mild reversal of normal cervical lordosis.  No listhesis.

Vertebrae: No acute compression fracture, discitis-osteomyelitis,
facet edema or other focal marrow lesion. No epidural collection.

Cord: Normal

Posterior Fossa, vertebral arteries, paraspinal tissues: Visualized
posterior fossa is normal. Vertebral artery flow voids are
preserved. No prevertebral soft tissue swelling.

Disc levels:

C1-C2: Normal.

C2-C3: Normal disc space and facets. No spinal canal or
neuroforaminal stenosis.

C3-C4: Normal disc space and facets. No spinal canal or
neuroforaminal stenosis.

C4-C5: Left facet hypertrophy with moderate left foraminal
narrowing. No spinal canal or right neural foraminal stenosis.

C5-C6: Small disc bulge and mild uncovertebral hypertrophy. Mild
narrowing of the right neural foramen. No spinal canal stenosis.

C6-C7: Disc bulge and bilateral uncovertebral hypertrophy with
moderate-to-severe bilateral neural foraminal stenosis. No central
spinal canal stenosis.

C7-T1: Normal disc space and facets. No spinal canal or
neuroforaminal stenosis.

T1-2: Normal.

Sagittal imaging of T2-3 and T3-4 is normal.
IMPRESSION: 1. Moderate bilateral C7 neural foraminal stenosis, primarily due to
uncovertebral hypertrophy.
2. Moderate left C5 neural foraminal stenosis.
3. Mild right C6 neural foraminal stenosis.

## 2019-06-17 ENCOUNTER — Other Ambulatory Visit: Payer: Self-pay

## 2019-06-17 ENCOUNTER — Ambulatory Visit
Admission: RE | Admit: 2019-06-17 | Discharge: 2019-06-17 | Disposition: A | Discharge status: Home / Self Care | Payer: Medicare HMO | Source: Ambulatory Visit | Attending: Family Medicine | Admitting: Family Medicine

## 2019-06-17 ENCOUNTER — Ambulatory Visit
Admission: RE | Admit: 2019-06-17 | Discharge: 2019-06-17 | Disposition: A | Discharge status: Home / Self Care | Payer: Medicare HMO | Attending: Family Medicine | Admitting: Family Medicine

## 2019-06-17 ENCOUNTER — Other Ambulatory Visit: Payer: Self-pay | Admitting: Family Medicine

## 2019-06-17 DIAGNOSIS — M25572 Pain in left ankle and joints of left foot: Secondary | ICD-10-CM

## 2019-06-17 IMAGING — CR DG ANKLE 2V *L*
2 series · 2 of 2 positions shown · non-contrast
Comparison: None.

CLINICAL DATA: 67-year-old female with ankle pain and swelling for
the past 1 year. No known injury.

EXAM:
LEFT ANKLE - 2 VIEW

[ankle ap]
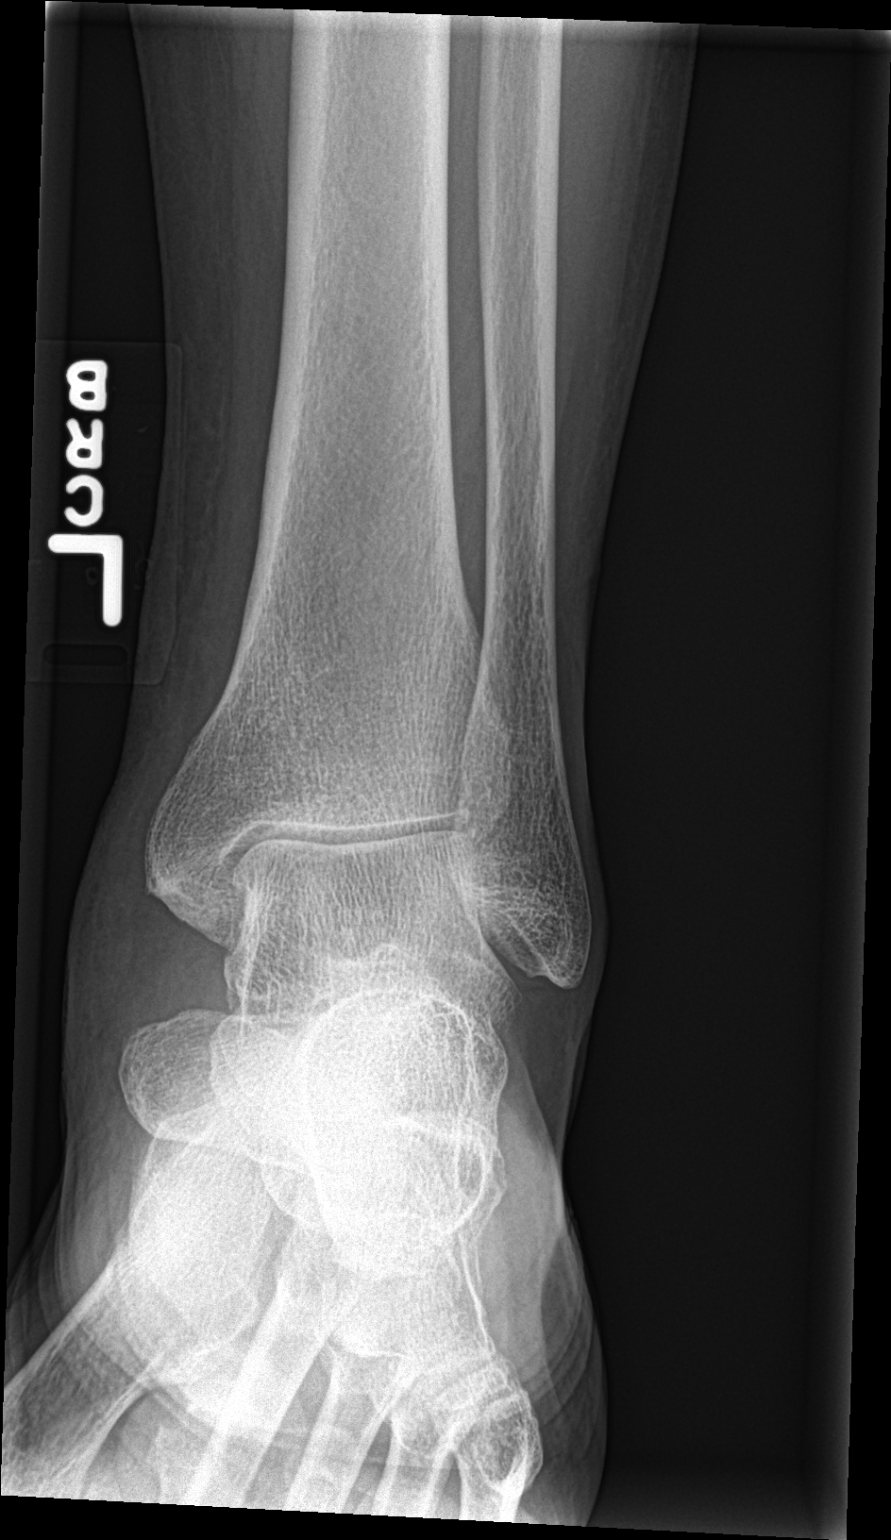

[ankle lat]
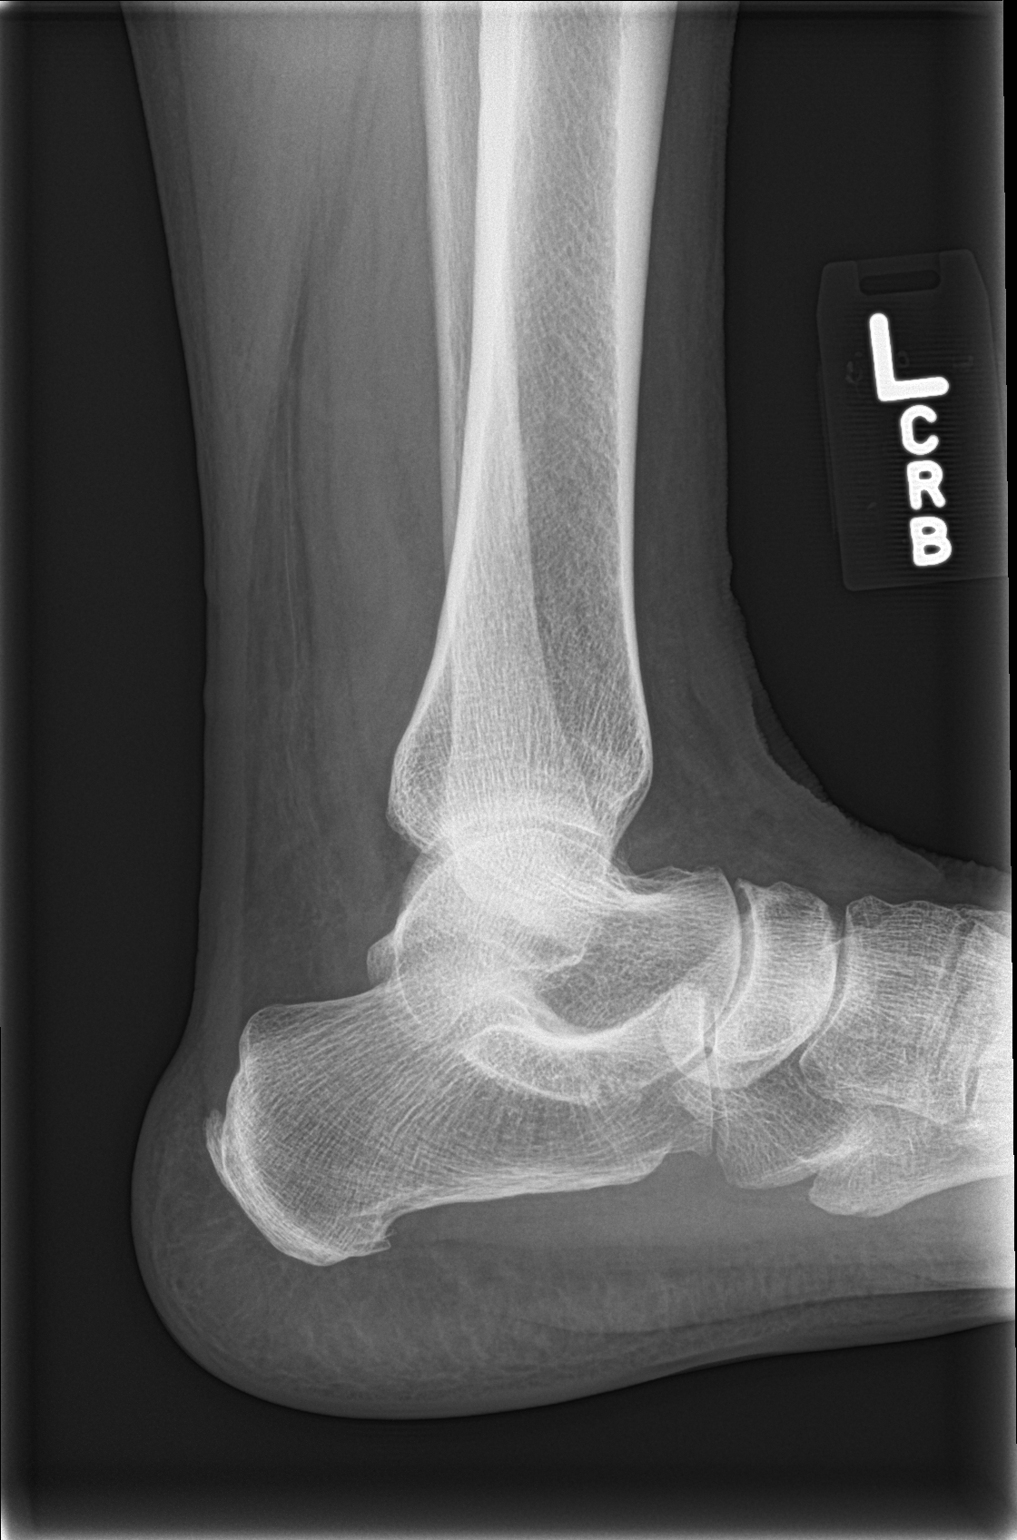

[2 of 2 positions shown; findings below may reference images not displayed]

FINDINGS: Soft tissue swelling is present about the ankle, particularly the
medial malleolus. There is no evidence of fracture, malalignment,
joint effusion or bony lesion.
IMPRESSION: Soft tissue swelling, particularly about the medial ankle without
evidence of underlying fracture, malalignment or joint effusion.

## 2019-07-17 ENCOUNTER — Other Ambulatory Visit: Payer: Self-pay | Admitting: Pediatrics

## 2019-07-17 DIAGNOSIS — Z1231 Encounter for screening mammogram for malignant neoplasm of breast: Secondary | ICD-10-CM

## 2019-10-07 ENCOUNTER — Ambulatory Visit
Admission: RE | Admit: 2019-10-07 | Discharge: 2019-10-07 | Disposition: A | Discharge status: Home / Self Care | Payer: Medicare Other | Source: Ambulatory Visit | Attending: Pediatrics | Admitting: Pediatrics

## 2019-10-07 DIAGNOSIS — Z1231 Encounter for screening mammogram for malignant neoplasm of breast: Secondary | ICD-10-CM | POA: Diagnosis present

## 2019-10-07 IMAGING — MG DIGITAL SCREENING BILAT W/ TOMO W/ CAD
8 series · 8 of 24 positions shown · non-contrast
Comparison: Previous exam(s).

CLINICAL DATA: Screening.

EXAM:
DIGITAL SCREENING BILATERAL MAMMOGRAM WITH TOMO AND CAD

[R CC synth-2D]
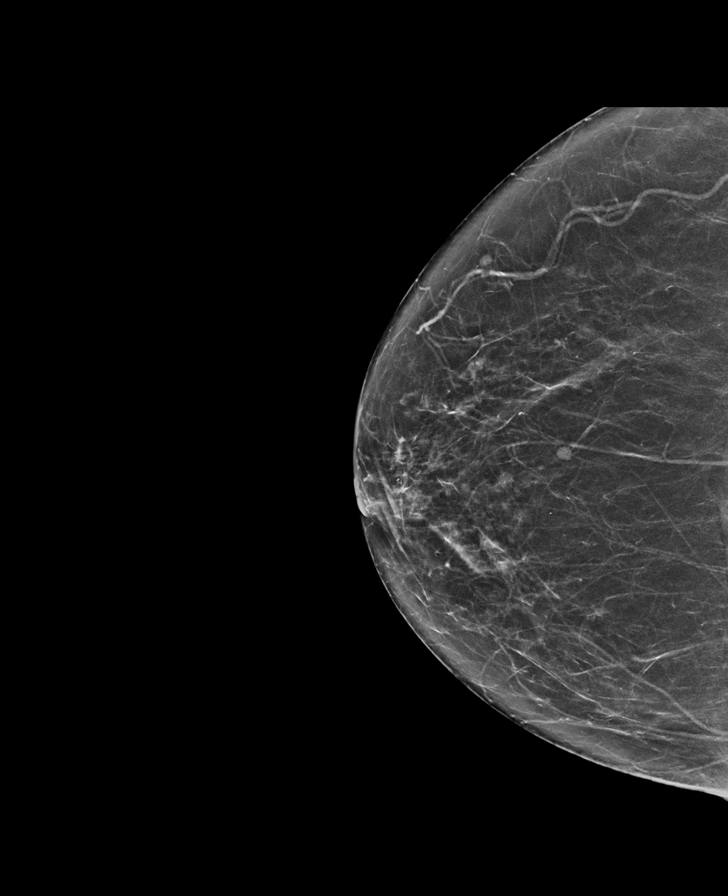

[L MLO synth-2D]
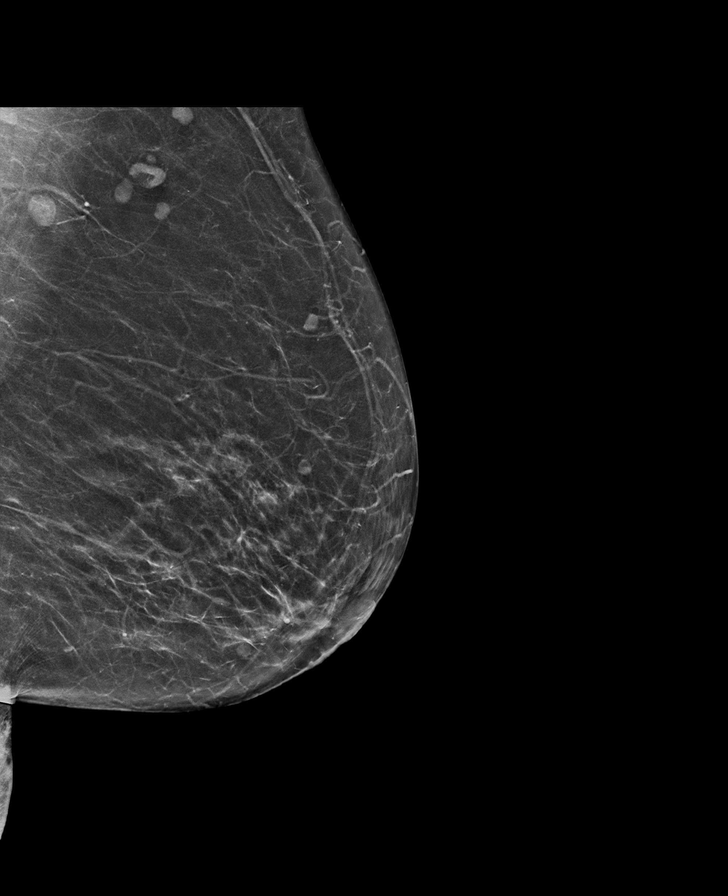

[L CC synth-2D]
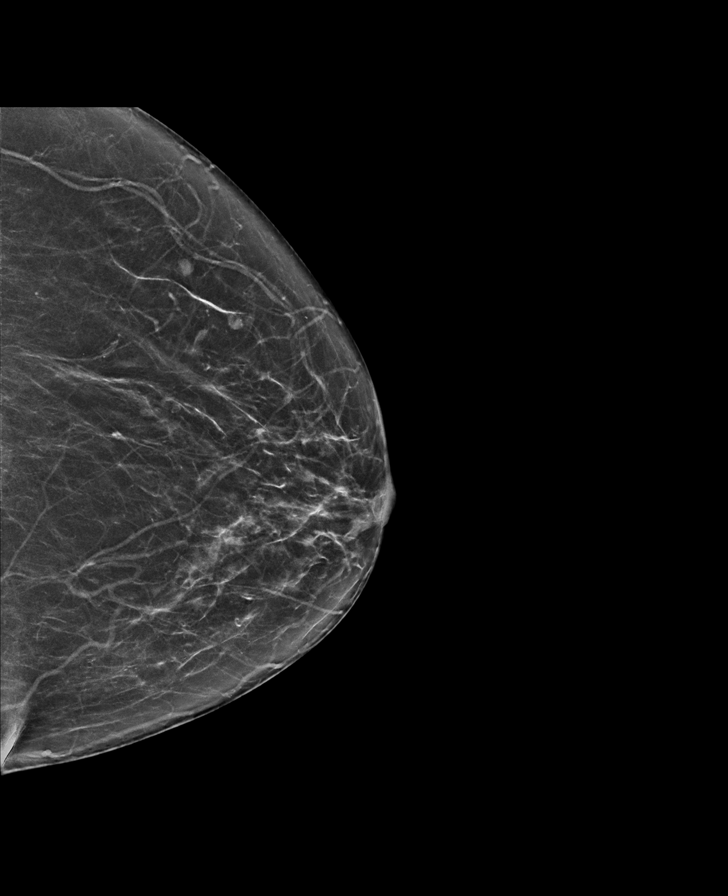

[R MLO synth-2D]
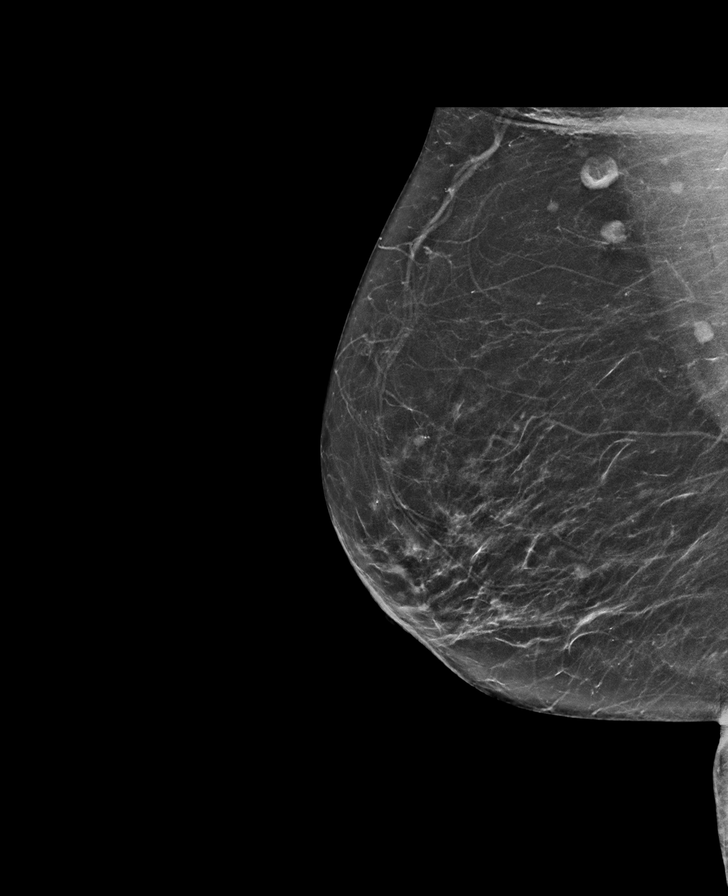

[L MLO tomo · tomo slice 34/67.0]
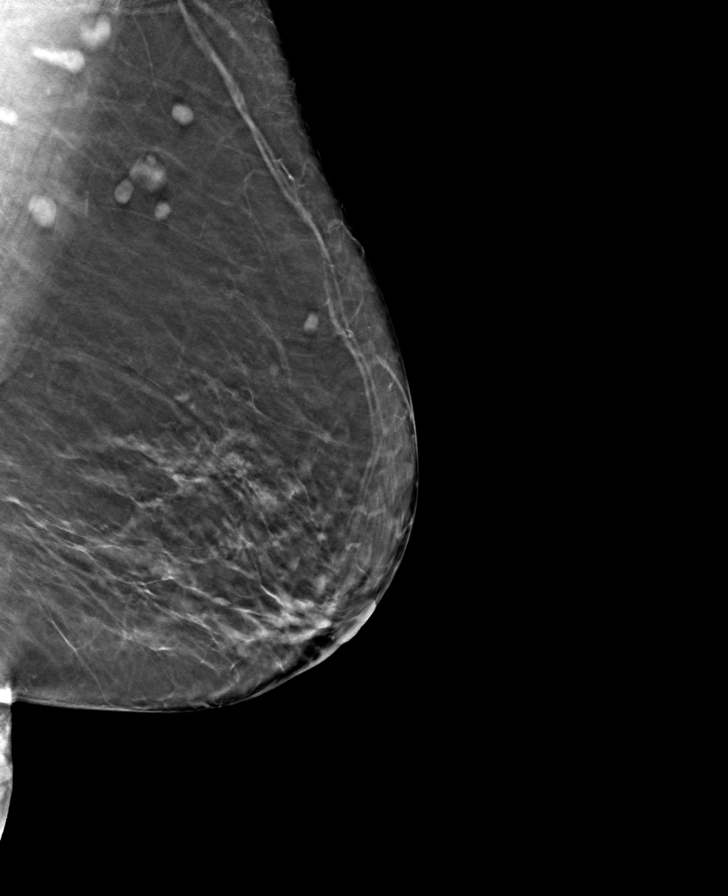

[R CC tomo · tomo slice 33/66.0]
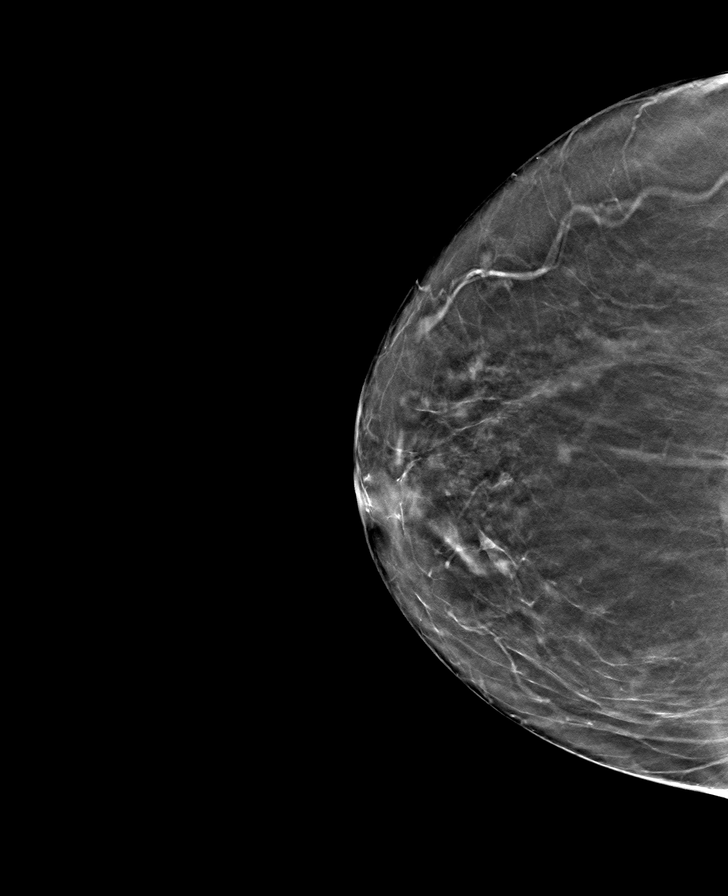

[R MLO tomo · tomo slice 35/68.0]
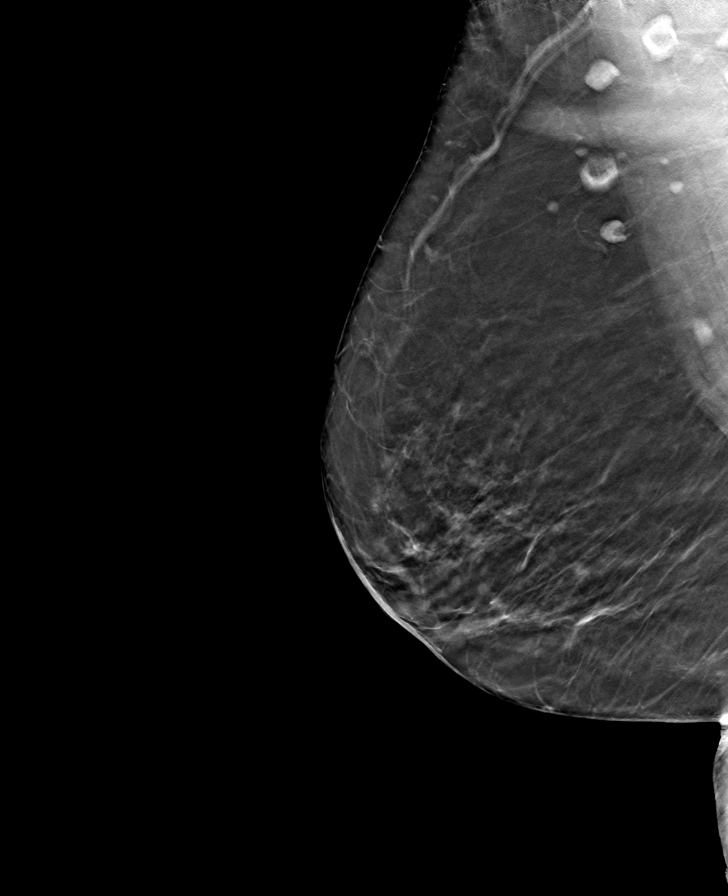

[L CC tomo · tomo slice 32/63.0]
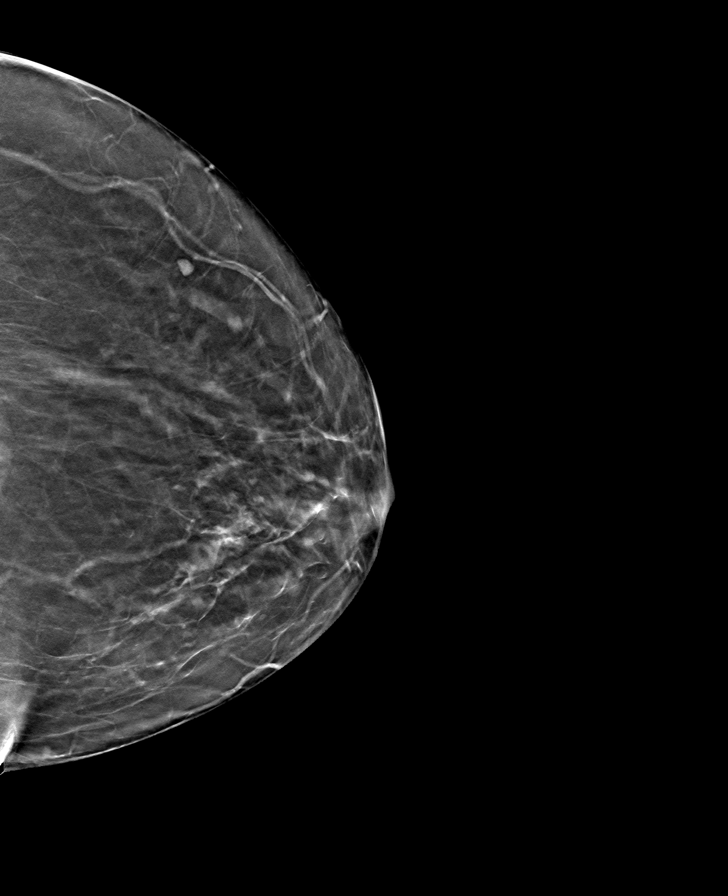

[8 of 24 positions shown; findings below may reference images not displayed]

ACR Breast Density Category b: There are scattered areas of
fibroglandular density.
FINDINGS: There are no findings suspicious for malignancy. Images were
processed with CAD.
IMPRESSION: No mammographic evidence of malignancy. A result letter of this
screening mammogram will be mailed directly to the patient.

RECOMMENDATION:
Screening mammogram in one year. (Code:[TQ])

BI-RADS CATEGORY  1: Negative.

## 2020-06-15 ENCOUNTER — Other Ambulatory Visit: Payer: Self-pay

## 2020-06-15 ENCOUNTER — Other Ambulatory Visit: Payer: Medicare Other

## 2020-06-15 DIAGNOSIS — Z20822 Contact with and (suspected) exposure to covid-19: Secondary | ICD-10-CM

## 2020-06-17 LAB — SARS-COV-2, NAA 2 DAY TAT

## 2020-06-17 LAB — NOVEL CORONAVIRUS, NAA: SARS-CoV-2, NAA: NOT DETECTED

## 2020-09-15 ENCOUNTER — Other Ambulatory Visit: Payer: Self-pay | Admitting: Pediatrics

## 2020-09-15 ENCOUNTER — Other Ambulatory Visit: Payer: Self-pay | Admitting: Internal Medicine

## 2020-09-15 DIAGNOSIS — Z1231 Encounter for screening mammogram for malignant neoplasm of breast: Secondary | ICD-10-CM

## 2020-09-18 ENCOUNTER — Other Ambulatory Visit: Payer: Self-pay

## 2020-09-18 ENCOUNTER — Ambulatory Visit
Admission: EM | Admit: 2020-09-18 | Discharge: 2020-09-18 | Discharge status: Against Medical Advice | Payer: Medicare Other

## 2020-09-19 ENCOUNTER — Emergency Department
Admission: EM | Admit: 2020-09-19 | Discharge: 2020-09-19 | Disposition: A | Discharge status: Home / Self Care | Payer: Medicare Other | Attending: Emergency Medicine | Admitting: Emergency Medicine

## 2020-09-19 ENCOUNTER — Encounter: Payer: Self-pay | Admitting: Emergency Medicine

## 2020-09-19 ENCOUNTER — Emergency Department: Payer: Medicare Other

## 2020-09-19 DIAGNOSIS — I10 Essential (primary) hypertension: Secondary | ICD-10-CM | POA: Diagnosis not present

## 2020-09-19 DIAGNOSIS — R2241 Localized swelling, mass and lump, right lower limb: Secondary | ICD-10-CM | POA: Diagnosis present

## 2020-09-19 DIAGNOSIS — M5416 Radiculopathy, lumbar region: Secondary | ICD-10-CM | POA: Diagnosis not present

## 2020-09-19 DIAGNOSIS — M7121 Synovial cyst of popliteal space [Baker], right knee: Secondary | ICD-10-CM | POA: Insufficient documentation

## 2020-09-19 DIAGNOSIS — Z79899 Other long term (current) drug therapy: Secondary | ICD-10-CM | POA: Insufficient documentation

## 2020-09-19 DIAGNOSIS — Z7982 Long term (current) use of aspirin: Secondary | ICD-10-CM | POA: Diagnosis not present

## 2020-09-19 IMAGING — US US EXTREM LOW VENOUS*R*
1 series · 14 of 24 positions shown · non-contrast
Comparison: None.

CLINICAL DATA: Right leg swelling and pain for 3 days.

EXAM:
RIGHT LOWER EXTREMITY VENOUS DOPPLER ULTRASOUND
TECHNIQUE: Gray-scale sonography with compression, as well as color and duplex
ultrasound, were performed to evaluate the deep venous system(s)
from the level of the common femoral vein through the popliteal and
proximal calf veins.

[Series 1: us venous img lower uni right (dvt) · portal-venous · 43 acquisitions, 14 frames shown]
[im 1/43]
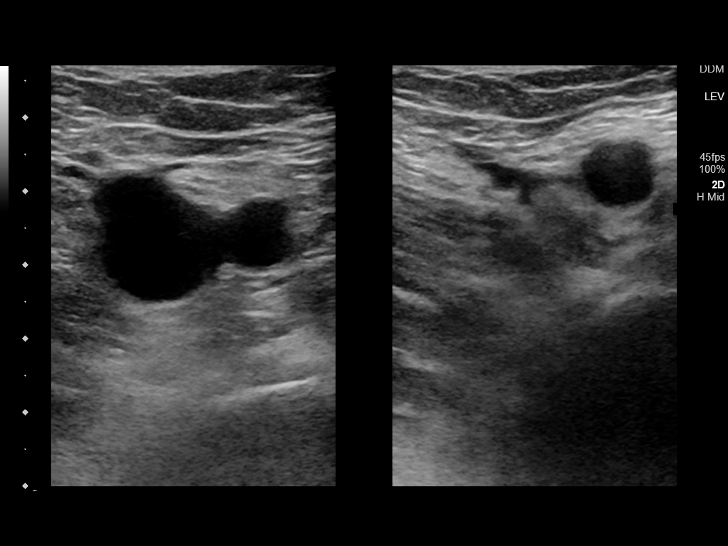
[im 4/43]
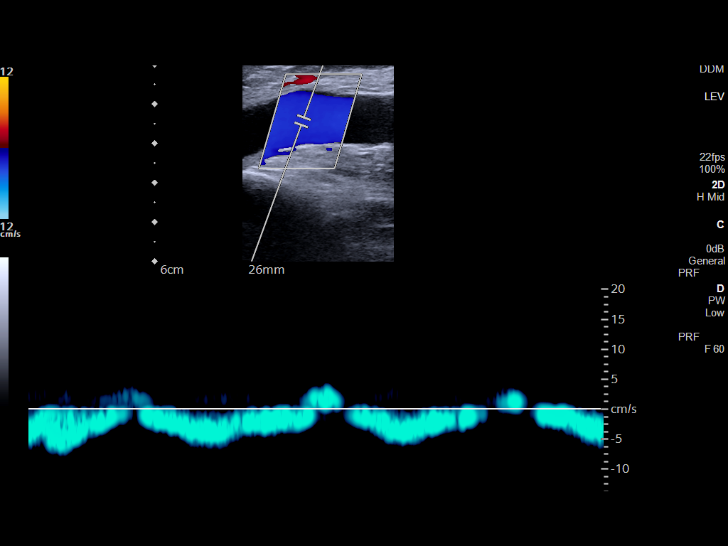
[im 8/43]
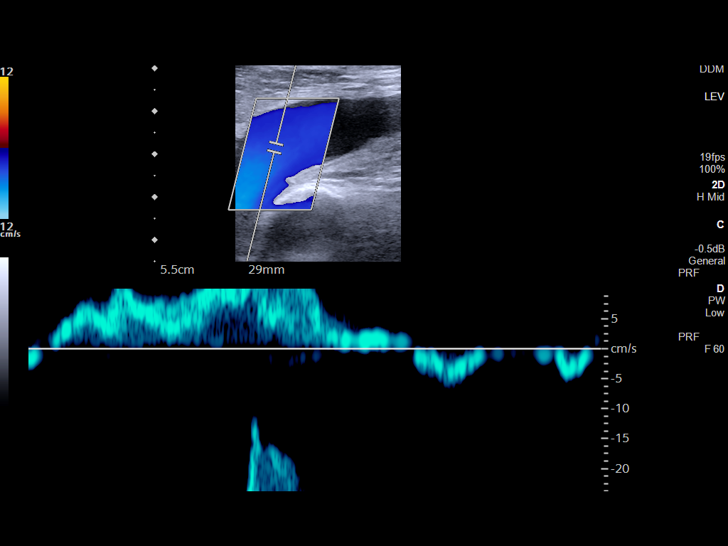
[im 11/43]
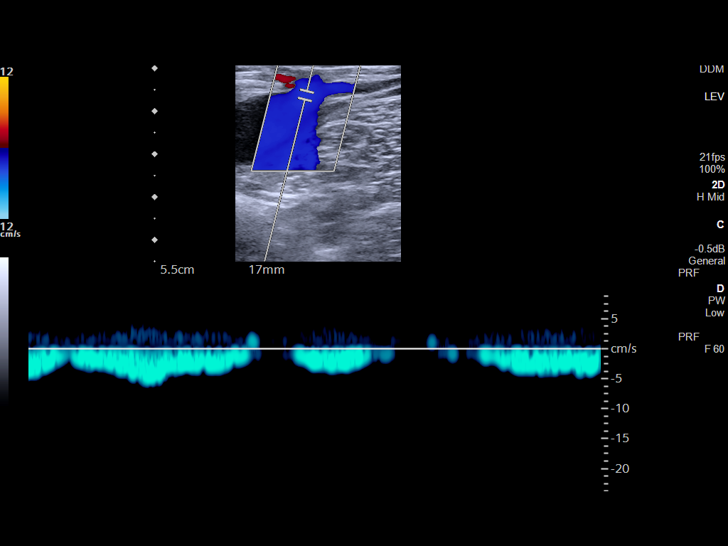
[im 13/43]
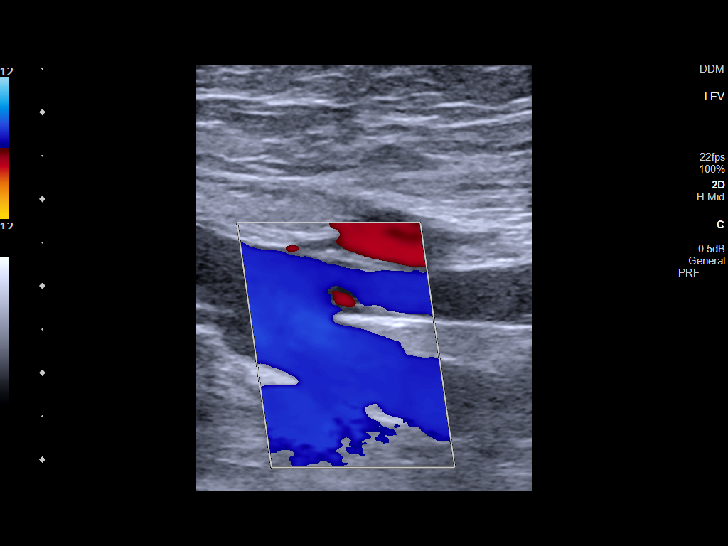
[im 17/43]
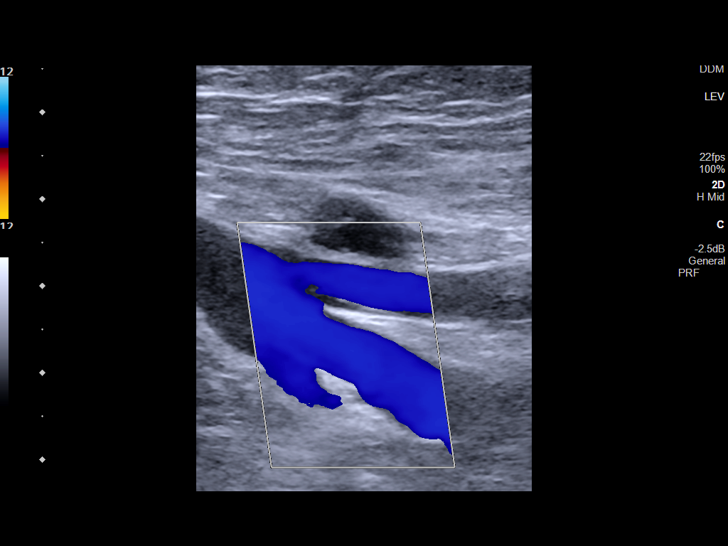
[im 21/43]
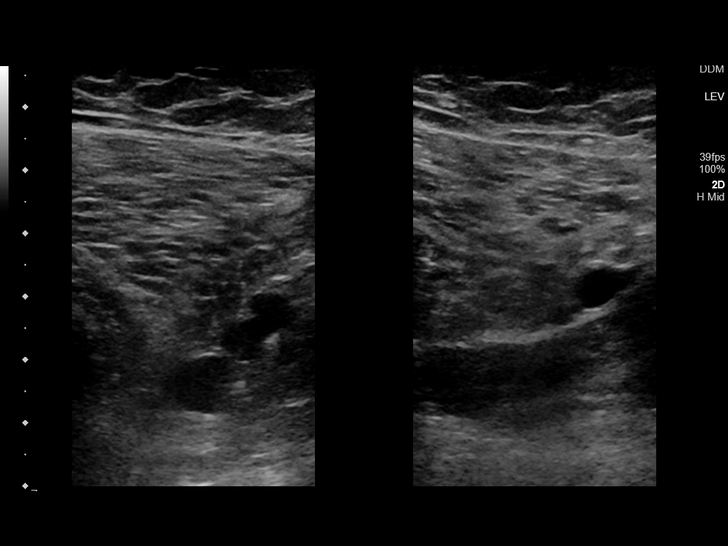
[im 24/43]
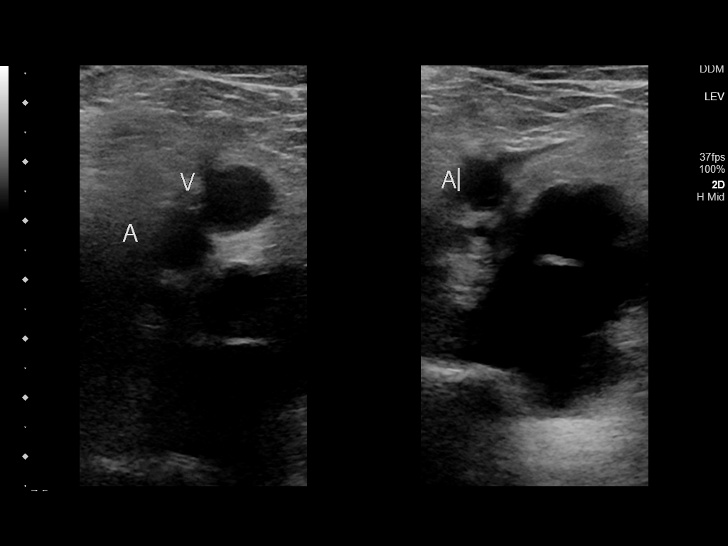
[im 28/43]
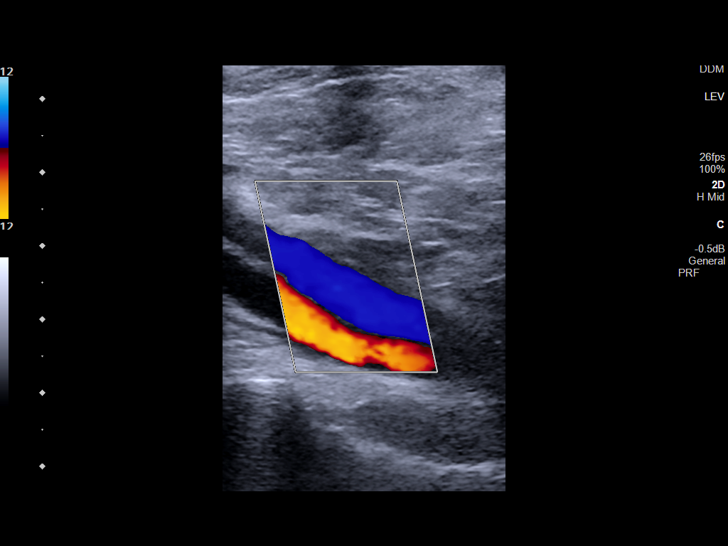
[im 32/43]
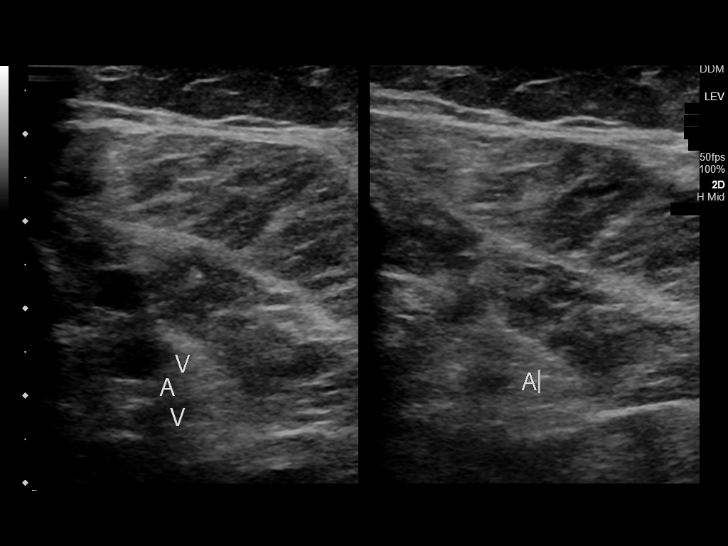
[im 35/43]
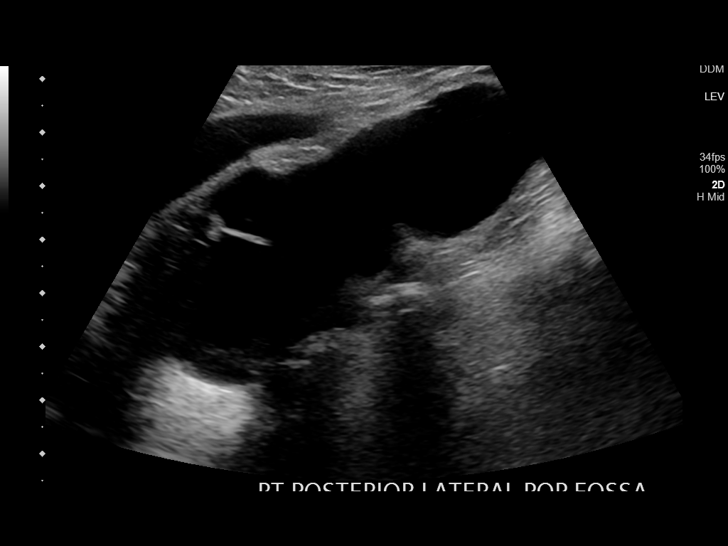
[im 37/43]
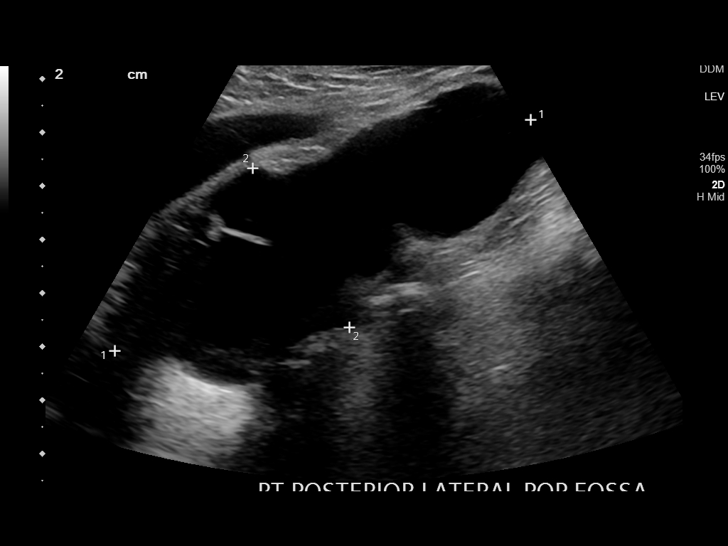
[im 41/43]
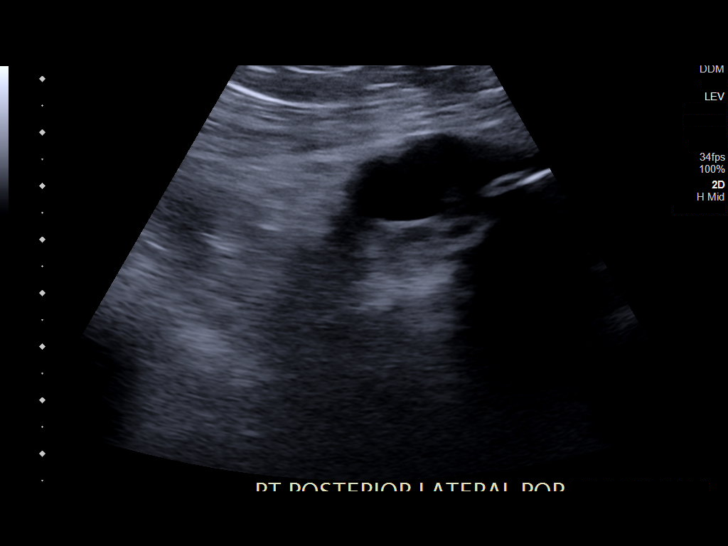
[im 43/43]
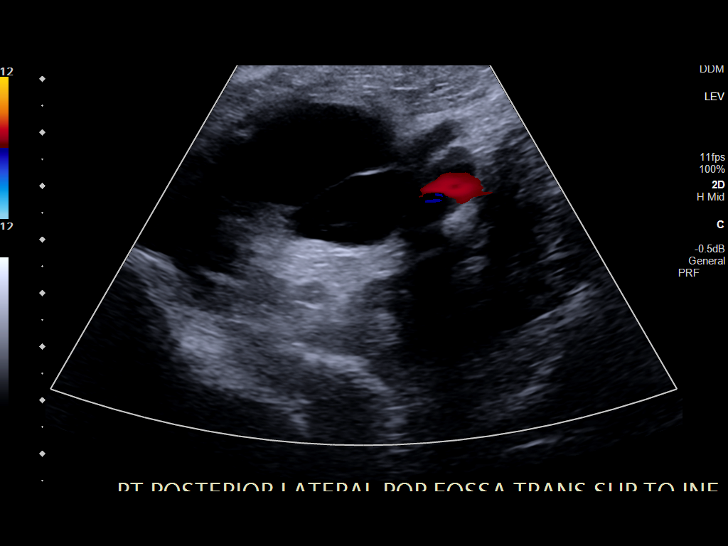

[14 of 24 positions shown; findings below may reference images not displayed]

FINDINGS: VENOUS

Normal compressibility of the common femoral, superficial femoral,
and popliteal veins, as well as the visualized calf veins.
Visualized portions of profunda femoral vein and great saphenous
vein unremarkable. No filling defects to suggest DVT on grayscale or
color Doppler imaging. Doppler waveforms show normal direction of
venous flow, normal respiratory plasticity and response to
augmentation.

Limited views of the contralateral common femoral vein are
unremarkable.

OTHER

A cystic lesion in the popliteal fossa measures approximately 9 cm
craniocaudal by 3.5 cm AP by 5.5 cm transverse.

Limitations: None.
IMPRESSION: Negative for deep venous thrombosis.

Moderately large Baker's cyst.

## 2020-09-19 MED ORDER — CYCLOBENZAPRINE HCL 5 MG PO TABS: 5.0000 mg | ORAL_TABLET | Freq: Three times a day (TID) | ORAL | 0 refills | Status: DC | PRN

## 2020-09-19 MED ORDER — LIDOCAINE 5 % EX PTCH
1.0000 | MEDICATED_PATCH | CUTANEOUS | Status: DC
  Administered 2020-09-19: 1 via TRANSDERMAL
  Filled 2020-09-19: qty 1

## 2020-09-19 MED ORDER — LIDOCAINE 5 % EX PTCH: 1.0000 | MEDICATED_PATCH | Freq: Two times a day (BID) | CUTANEOUS | 0 refills | Status: AC

## 2020-09-19 MED ORDER — KETOROLAC TROMETHAMINE 30 MG/ML IJ SOLN
30.0000 mg | Freq: Once | INTRAMUSCULAR | Status: AC
  Administered 2020-09-19: 30 mg via INTRAMUSCULAR
  Filled 2020-09-19: qty 1

## 2020-09-19 NOTE — ED Provider Notes (Signed)
[facility] Emergency Department Provider Note   ____________________________________________   Event Date/Time   First MD Initiated Contact with Patient 09/19/20 1418     (approximate)  I have reviewed the triage vital signs and the nursing notes.   HISTORY  Chief Complaint Leg Swelling    HPI Jennifer Rubio is a 69 y.o. female with past medical history of hypertension who presents to the ED complaining of leg and back pain.  Patient reports that for the past 3 days she has been dealing with pain in her right calf and thigh.  She also has pain in her right lower back that seems to go down the back of her right leg.  She feels like the leg is swollen in her calf and thigh, although she denies any recent trauma.  She denies any redness, rash, or fever.  She has not had any dysuria, hematuria, numbness, or weakness.  She was seen initially at her PCPs office, but referred to the ED for further evaluation of possible DVT.  Patient does not currently take any blood thinners.        Past Medical History:  Diagnosis Date  . Cellulitis and abscess of leg January 2013  . Hypertension   . Osteoarthritis   . Urinary tract infection January 2013    Patient Active Problem List   Diagnosis Date Noted  . Muscle strain of left lower extremity 06/29/2015  . Anemia 10/21/2011  . UTI (lower urinary tract infection) 10/21/2011  . Cellulitis of knee, left 10/19/2011  . HTN (hypertension), benign 10/19/2011  . Obesity 10/19/2011  . Osteoarthritis 10/19/2011  . Hyperglycemia 10/19/2011    Past Surgical History:  Procedure Laterality Date  . ABDOMINAL HYSTERECTOMY    . BLADDER SUSPENSION  11/26/2011   Procedure: TRANSVAGINAL TAPE (TVT) PROCEDURE;  Surgeon: Genia Del, MD;  Location: [facility];  Service: Gynecology;  Laterality: N/A;  . BREAST CYST ASPIRATION Right   . BUNIONECTOMY    . CYSTOSCOPY  11/26/2011   Procedure: CYSTOSCOPY;  Surgeon: Genia Del,  MD;  Location: [facility];  Service: Gynecology;  Laterality: N/A;  . KNEE ARTHROSCOPY     both knees  . RECTOCELE REPAIR  11/26/2011   Procedure: POSTERIOR REPAIR (RECTOCELE);  Surgeon: Genia Del, MD;  Location: [facility];  Service: Gynecology;  Laterality: N/A;  . ROBOTIC ASSISTED LAPAROSCOPIC SACROCOLPOPEXY  11/26/2011   Procedure: ROBOTIC ASSISTED LAPAROSCOPIC SACROCOLPOPEXY;  Surgeon: Genia Del, MD;  Location: [facility];  Service: Gynecology;  Laterality: N/A;    Prior to Admission medications   Medication Sig Start Date End Date Taking? Authorizing Provider  cyclobenzaprine (FLEXERIL) 5 MG tablet Take 1 tablet (5 mg total) by mouth 3 (three) times daily as needed for muscle spasms. 09/19/20  Yes Chesley Noon, MD  lidocaine (LIDODERM) 5 % Place 1 patch onto the skin every 12 (twelve) hours. Remove & Discard patch within 12 hours or as directed by MD 09/19/20 09/19/21 Yes Chesley Noon, MD  aspirin EC 81 MG tablet Take 81 mg by mouth.    [provider]  diphenhydrAMINE (BENADRYL ALLERGY) 25 mg capsule Take 1 capsule (25 mg total) by mouth every 6 (six) hours as needed for up to 5 days. 10/18/18 10/23/18  Orvil Feil, PA-C  famotidine (PEPCID) 20 MG tablet Take 1 tablet (20 mg total) by mouth 2 (two) times daily for 5 days. 10/18/18 10/23/18  Orvil Feil, PA-C  HYDROcodone-acetaminophen (NORCO) 5-325 MG tablet Take 1 tablet by  mouth every 6 (six) hours as needed for moderate pain. 11/11/16   Irean Hong, MD  ibuprofen (ADVIL,MOTRIN) 800 MG tablet Take 1 tablet (800 mg total) by mouth every 8 (eight) hours as needed for moderate pain. 11/11/16   Irean Hong, MD  losartan-hydrochlorothiazide (HYZAAR) 50-12.5 MG tablet TAKE 1 TABLET BY MOUTH DAILY. 12/26/15   Gabriel Cirri, NP  methocarbamol (ROBAXIN) 500 MG tablet Take 1 tablet (500 mg total) by mouth 2 (two) times daily as needed. 07/27/16   Barrett Henle, PA-C  nystatin-triamcinolone (MYCOLOG II) cream Apply to  affected area daily 01/17/18   Geoffery Lyons, MD  tizanidine (ZANAFLEX) 2 MG capsule Take 2 mg by mouth as needed for muscle spasms.    [provider]    Allergies Sulfa antibiotics  Family History  Problem Relation Age of Onset  . Hypertension Father   . Diabetes Sister   . Cancer Sister        breast  . Breast cancer Sister 70  . Diabetes Brother   . Cancer Sister        colon  . Breast cancer Other     Social History Social History   Tobacco Use  . Smoking status: Never Smoker  . Smokeless tobacco: Never Used  Substance Use Topics  . Alcohol use: No  . Drug use: No    Review of Systems  Constitutional: No fever/chills Eyes: No visual changes. ENT: No sore throat. Cardiovascular: Denies chest pain. Respiratory: Denies shortness of breath. Gastrointestinal: No abdominal pain.  No nausea, no vomiting.  No diarrhea.  No constipation. Genitourinary: Negative for dysuria. Musculoskeletal: Positive for leg and back pain.  Positive for leg swelling. Skin: Negative for rash. Neurological: Negative for headaches, focal weakness or numbness.  ____________________________________________   PHYSICAL EXAM:  VITAL SIGNS: ED Triage Vitals  Enc Vitals Group     BP 09/19/20 1125 137/88     Pulse Rate 09/19/20 1125 66     Resp 09/19/20 1125 16     Temp 09/19/20 1125 98.4 F (36.9 C)     Temp Source 09/19/20 1125 Oral     SpO2 09/19/20 1125 99 %     Weight 09/19/20 1122 154 lb 1.6 oz (69.9 kg)     Height 09/19/20 1122 5' 1.5" (1.562 m)     Head Circumference --      Peak Flow --      Pain Score 09/19/20 1122 8     Pain Loc --      Pain Edu? --      Excl. in GC? --     Constitutional: Alert and oriented. Eyes: Conjunctivae are normal. Head: Atraumatic. Nose: No congestion/rhinnorhea. Mouth/Throat: Mucous membranes are moist. Neck: Normal ROM Cardiovascular: Normal rate, regular rhythm. Grossly normal heart sounds.  2+ DP pulses  bilaterally. Respiratory: Normal respiratory effort.  No retractions. Lungs CTAB. Gastrointestinal: Soft and nontender. No distention. Genitourinary: deferred Musculoskeletal: Trace edema to right lower extremity with diffuse tenderness to right calf, thigh, and paraspinal area of right lower back.  Range of motion intact to bilateral lower extremities. Neurologic:  Normal speech and language. No gross focal neurologic deficits are appreciated. Skin:  Skin is warm, dry and intact. No rash noted. Psychiatric: Mood and affect are normal. Speech and behavior are normal.  ____________________________________________   LABS (all labs ordered are listed, but only abnormal results are displayed)  Labs Reviewed - No data to display   PROCEDURES  Procedure(s) performed (including  Critical Care):  Procedures   ____________________________________________   INITIAL IMPRESSION / ASSESSMENT AND PLAN / ED COURSE       69 year old female with past medical history of hypertension presents to the ED with 3 days of gradually worsening pain and swelling in her right leg along with in her right lower back.  Ultrasound performed from triage and is negative for DVT but does show moderate to large right-sided Baker's cyst, which could be contributing to her pain.  She has some pain in her back but is neurovascularly intact to her bilateral lower extremities and I doubt cauda equina.  Symptoms most consistent with lumbar radiculopathy and we will treat with NSAIDs, Flexeril, and Lidoderm patch.  Patient is appropriate for discharge home with PCP follow-up, she was counseled to return to the ED for new worsening symptoms.      ____________________________________________   FINAL CLINICAL IMPRESSION(S) / ED DIAGNOSES  Final diagnoses:  Lumbar radiculopathy  Synovial cyst of right popliteal space     ED Discharge Orders         Ordered    cyclobenzaprine (FLEXERIL) 5 MG tablet  3 times daily  PRN        09/19/20 1435    lidocaine (LIDODERM) 5 %  Every 12 hours        09/19/20 1435           Note:  This document was prepared using Dragon voice recognition software and may include unintentional dictation errors.   Chesley Noon, MD 09/19/20 [phone]

## 2020-09-19 NOTE — ED Notes (Signed)
Continues to have pain. No change in scale 8/10

## 2020-09-19 NOTE — ED Triage Notes (Signed)
C/O right leg pain and swelling x 3 days.  Sent from [facility] for evaluation for possible blood clot.  AAOx3.  Skin warm and dry,. NAD

## 2020-10-07 ENCOUNTER — Ambulatory Visit
Admission: RE | Admit: 2020-10-07 | Discharge: 2020-10-07 | Disposition: A | Discharge status: Home / Self Care | Payer: Medicare Other | Source: Ambulatory Visit | Attending: Internal Medicine | Admitting: Internal Medicine

## 2020-10-07 ENCOUNTER — Other Ambulatory Visit: Payer: Self-pay

## 2020-10-07 DIAGNOSIS — Z1231 Encounter for screening mammogram for malignant neoplasm of breast: Secondary | ICD-10-CM

## 2020-10-07 IMAGING — MG DIGITAL SCREENING BILAT W/ TOMO W/ CAD
6 of 10 series · 6 of 30 positions shown · non-contrast
Comparison: Previous exam(s).

CLINICAL DATA: Screening.

EXAM:
DIGITAL SCREENING BILATERAL MAMMOGRAM WITH TOMO AND CAD

[R CC synth-2D (1 of 2)]
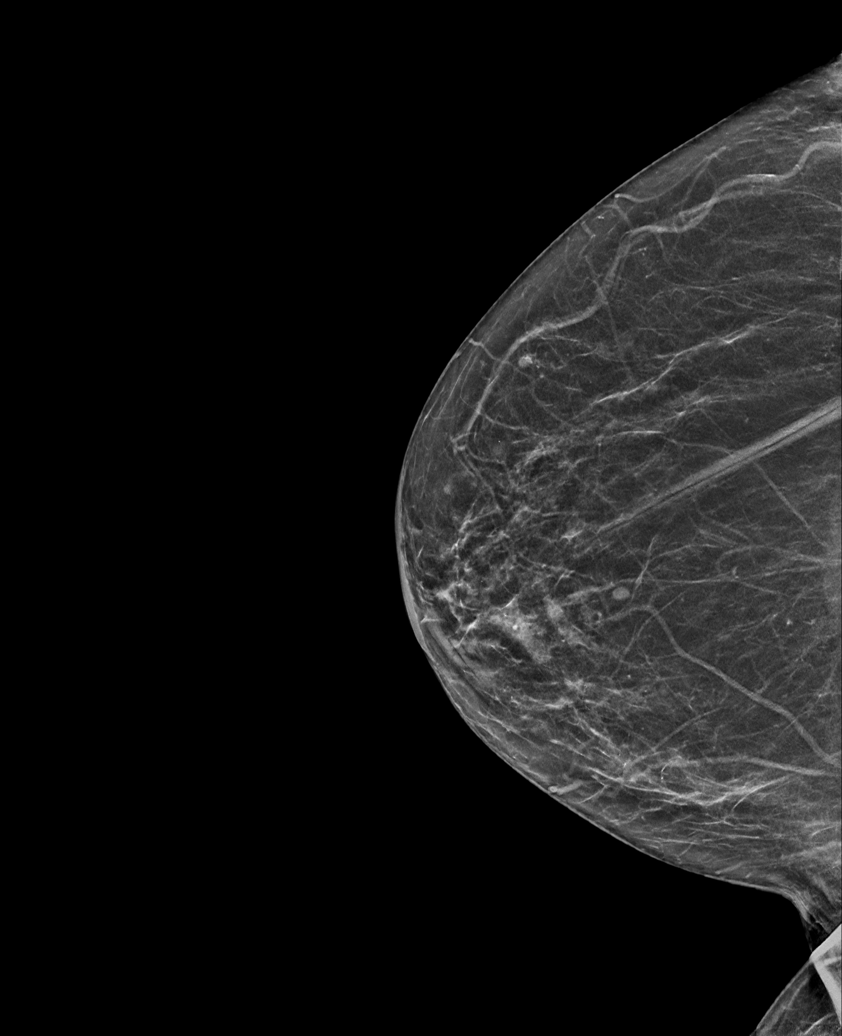

[L CC synth-2D]
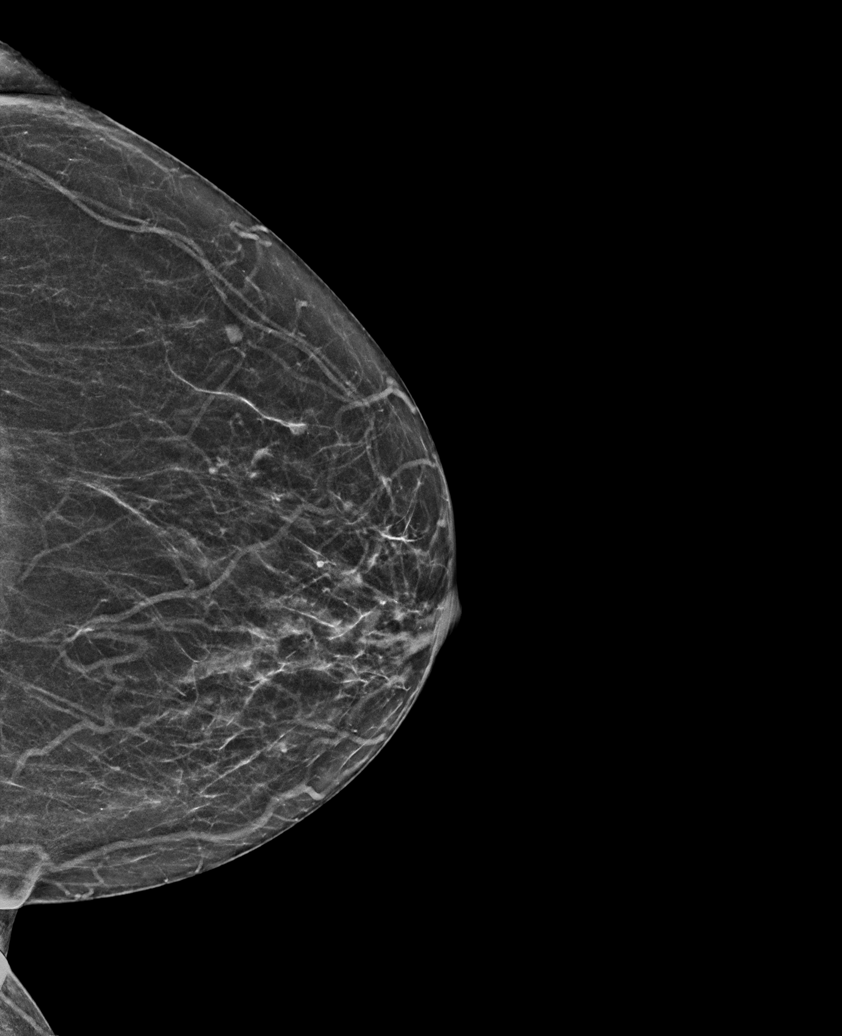

[R CC synth-2D (2 of 2)]
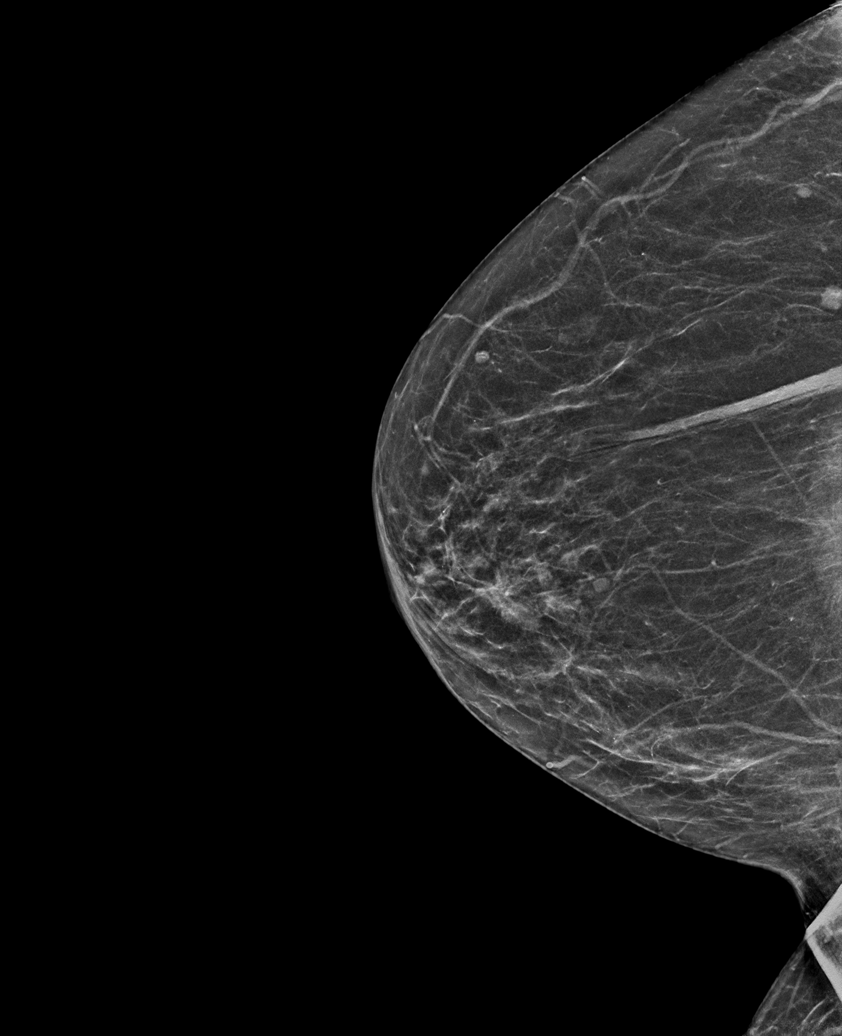

[R MLO synth-2D]
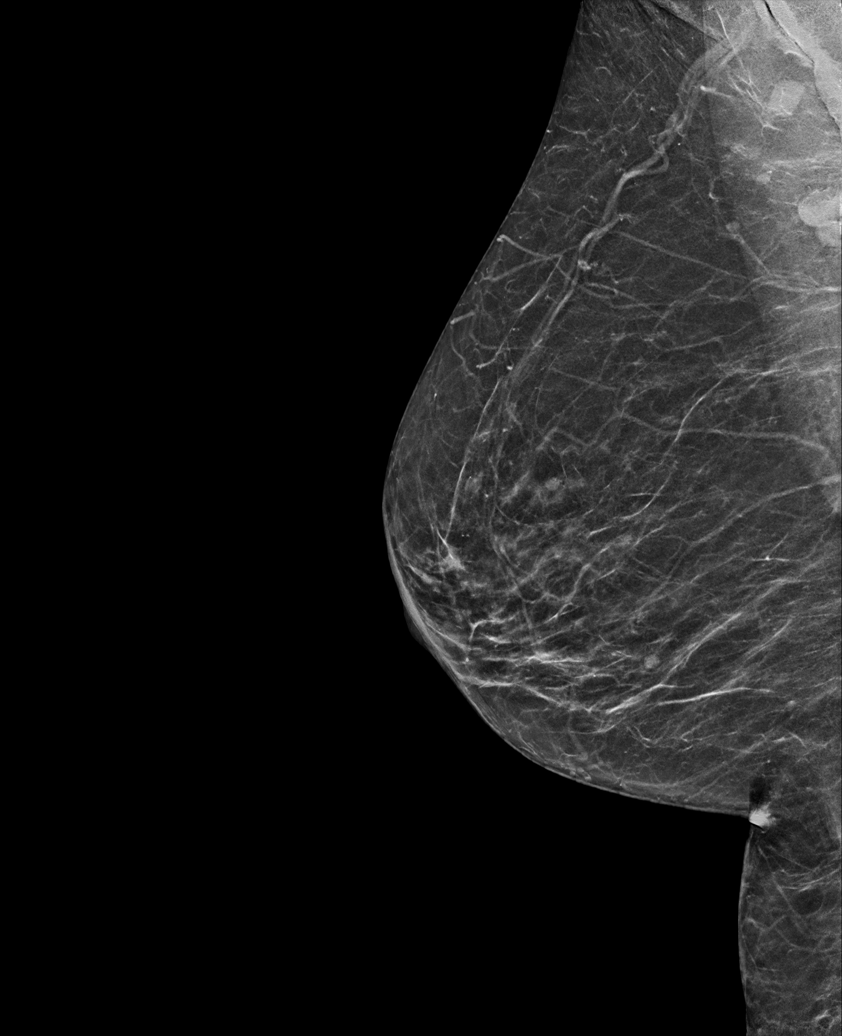

[L MLO synth-2D]
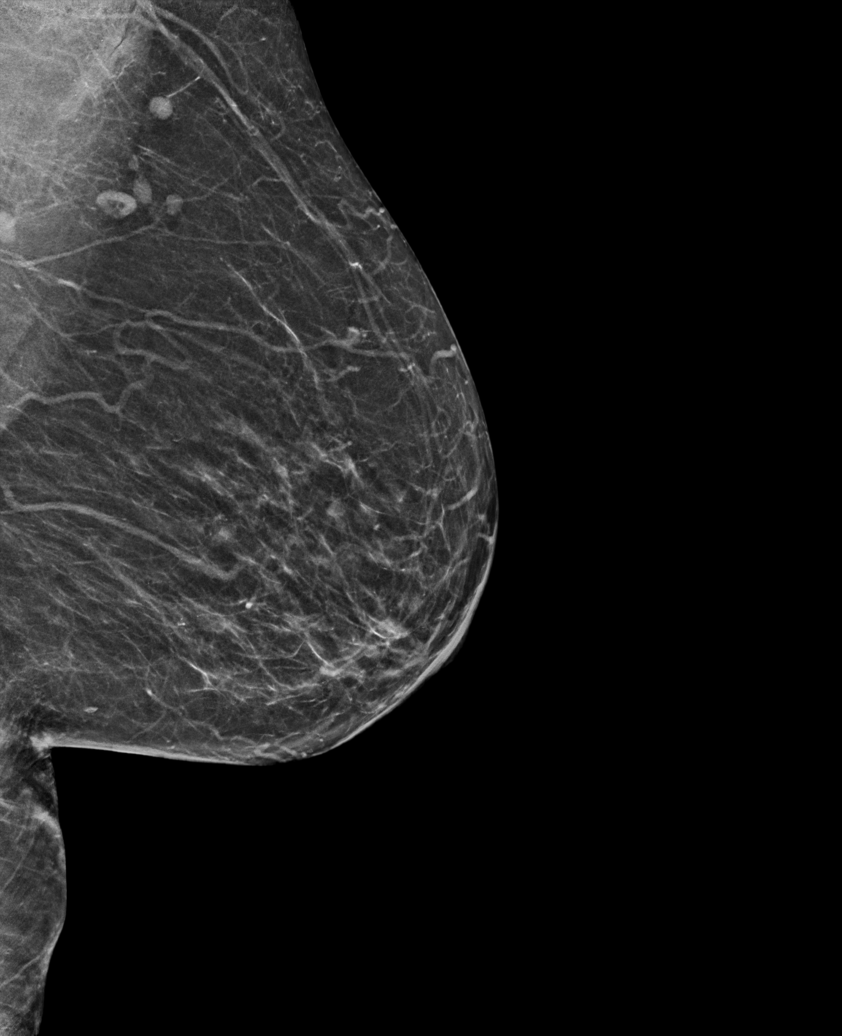

[R CC tomo · tomo slice 27/53.0]
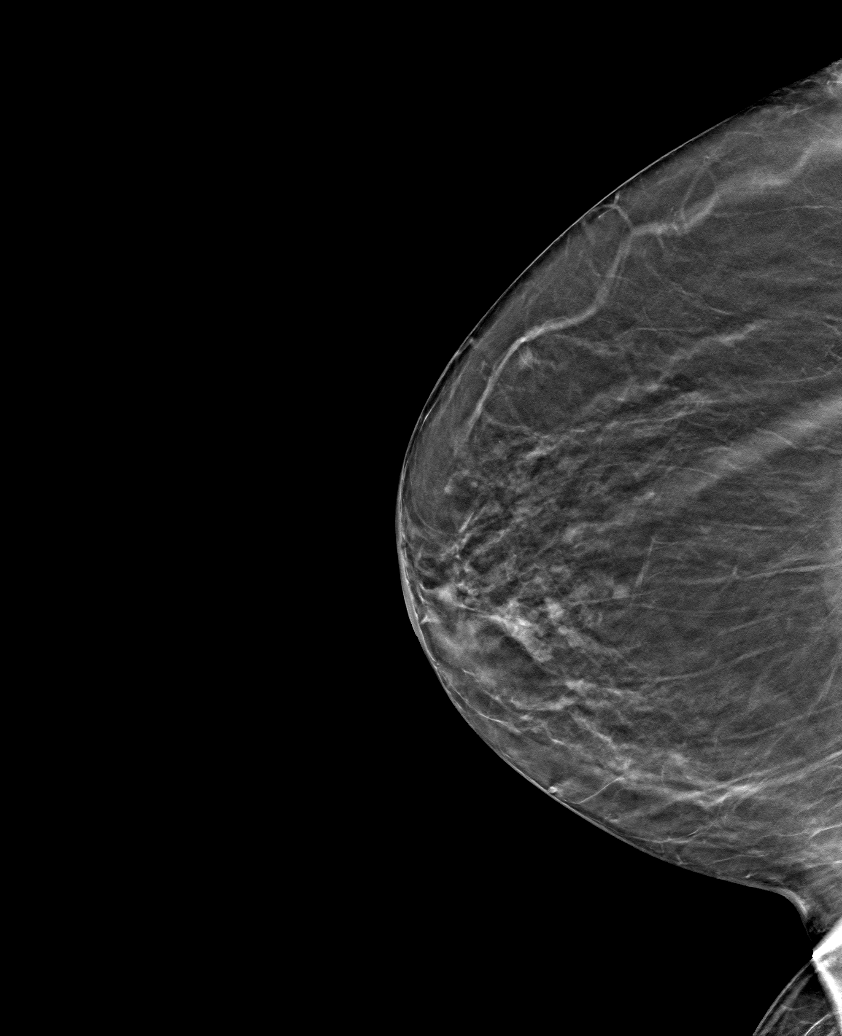

[6 of 30 positions shown; findings below may reference images not displayed]

ACR Breast Density Category b: There are scattered areas of
fibroglandular density.
FINDINGS: There are no findings suspicious for malignancy. Images were
processed with CAD.
IMPRESSION: No mammographic evidence of malignancy. A result letter of this
screening mammogram will be mailed directly to the patient.

RECOMMENDATION:
Screening mammogram in one year. (Code:[TQ])

BI-RADS CATEGORY  1: Negative.

## 2020-11-09 ENCOUNTER — Other Ambulatory Visit
Admission: RE | Admit: 2020-11-09 | Discharge: 2020-11-09 | Disposition: A | Discharge status: Home / Self Care | Payer: Medicare Other | Source: Ambulatory Visit | Attending: Student | Admitting: Student

## 2020-11-09 DIAGNOSIS — Z96651 Presence of right artificial knee joint: Secondary | ICD-10-CM | POA: Diagnosis present

## 2020-11-09 DIAGNOSIS — M7121 Synovial cyst of popliteal space [Baker], right knee: Secondary | ICD-10-CM | POA: Diagnosis present

## 2020-11-09 LAB — SYNOVIAL CELL COUNT + DIFF, W/ CRYSTALS
Crystals, Fluid: NONE SEEN
Eosinophils-Synovial: 0 %
Lymphocytes-Synovial Fld: 56 %
Monocyte-Macrophage-Synovial Fluid: 35 %
Neutrophil, Synovial: 9 %
Other Cells-SYN: 0
WBC, Synovial: 165 /mm3 (ref 0–200)

## 2020-12-29 ENCOUNTER — Other Ambulatory Visit (HOSPITAL_COMMUNITY): Payer: Self-pay | Admitting: Podiatry

## 2020-12-29 ENCOUNTER — Other Ambulatory Visit: Payer: Self-pay | Admitting: Podiatry

## 2020-12-29 DIAGNOSIS — S86111D Strain of other muscle(s) and tendon(s) of posterior muscle group at lower leg level, right leg, subsequent encounter: Secondary | ICD-10-CM

## 2021-01-12 ENCOUNTER — Ambulatory Visit
Admission: RE | Admit: 2021-01-12 | Discharge: 2021-01-12 | Disposition: A | Discharge status: Home / Self Care | Payer: Medicare Other | Source: Ambulatory Visit | Attending: Podiatry | Admitting: Podiatry

## 2021-01-12 ENCOUNTER — Other Ambulatory Visit: Payer: Self-pay

## 2021-01-12 DIAGNOSIS — S86111D Strain of other muscle(s) and tendon(s) of posterior muscle group at lower leg level, right leg, subsequent encounter: Secondary | ICD-10-CM | POA: Diagnosis not present

## 2021-01-12 IMAGING — MR MR ANKLE*R* W/O CM
5 series · 40 of 40 positions shown · non-contrast
Comparison: Plain films right foot [DATE].

CLINICAL DATA: Medial right ankle pain and swelling for
approximately 2 months. No known injury.

EXAM:
MRI OF THE RIGHT ANKLE WITHOUT CONTRAST
TECHNIQUE: Multiplanar, multisequence MR imaging of the ankle was performed. No
intravenous contrast was administered.

[Series 4: PD fat-sat · axial · right · 3.0mm · 0.50mm/px · z∈[-140,-1]mm · 10 of 36 slices shown]
[im 1/36]
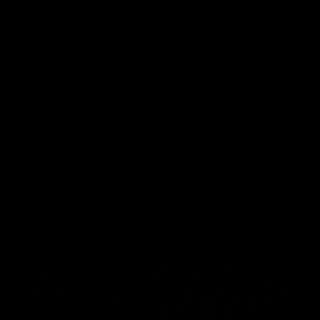
[im 4/36]
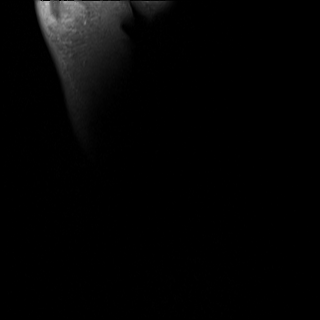
[im 8/36]
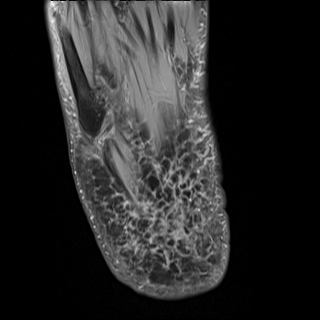
[im 12/36]
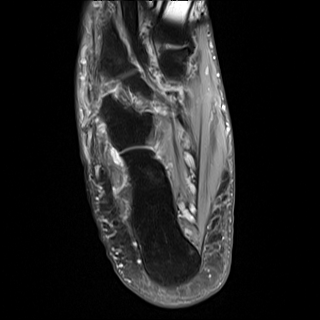
[im 16/36]
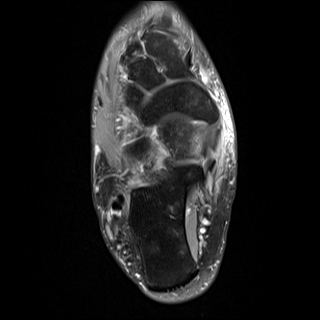
[im 20/36]
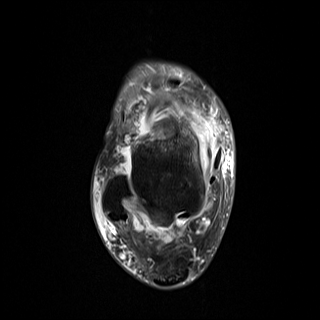
[im 24/36]
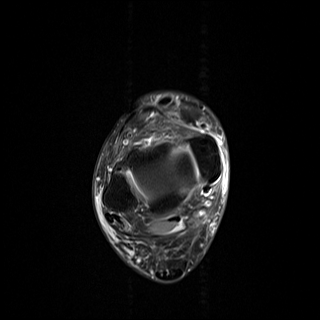
[im 28/36]
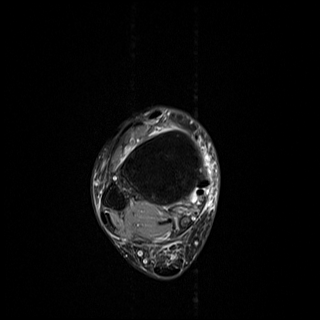
[im 32/36]
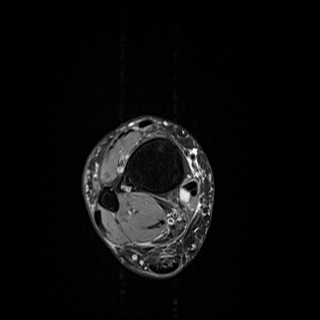
[im 36/36]
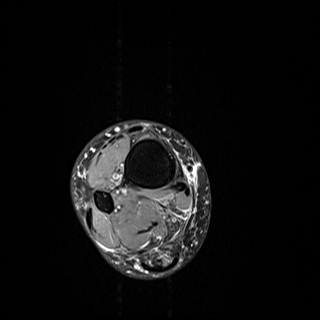

[Series 5: T2 fat-sat · axial · right · 3.0mm · 0.50mm/px · z∈[-140,-1]mm · 10 of 36 slices shown]
[im 1/36]
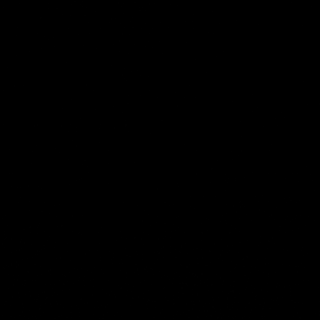
[im 4/36]
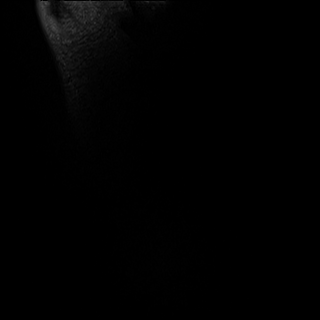
[im 8/36]
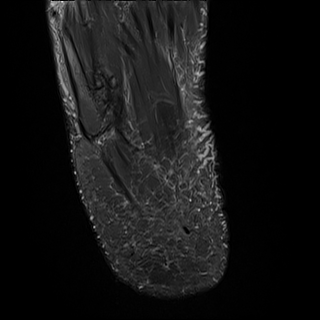
[im 12/36]
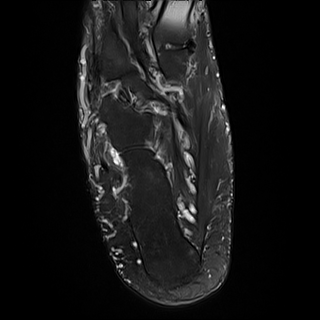
[im 16/36]
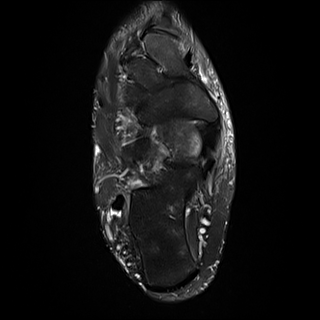
[im 20/36]
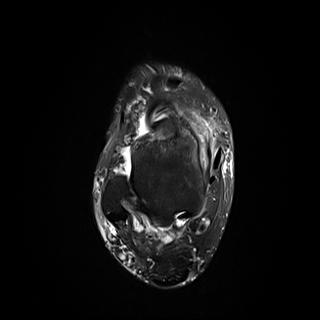
[im 24/36]
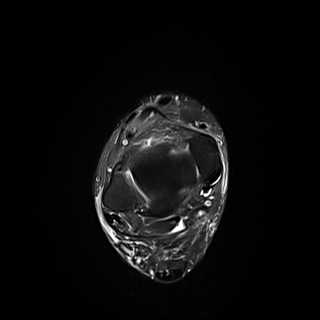
[im 28/36]
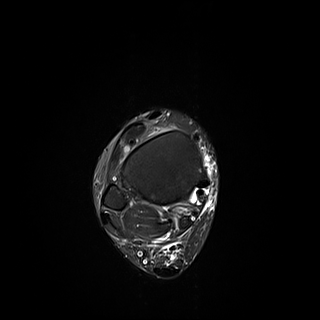
[im 32/36]
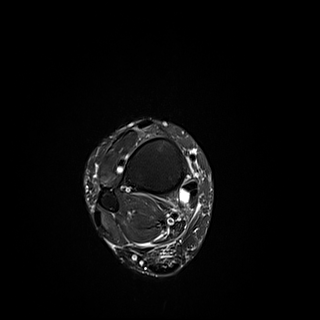
[im 36/36]
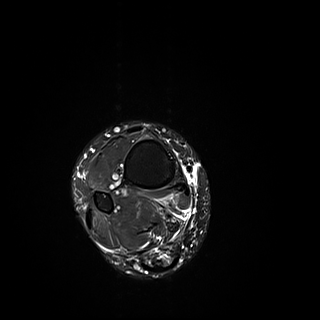

[Series 7: T2 · coronal · right · 3.0mm · 0.62mm/px · 10 of 40 slices shown]
[im 1/40]
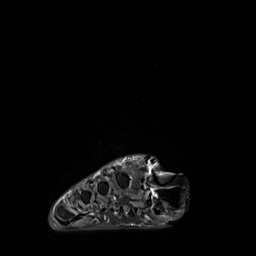
[im 5/40]
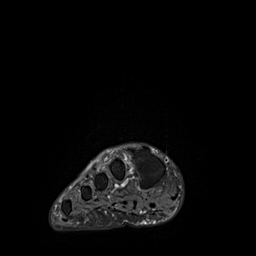
[im 9/40]
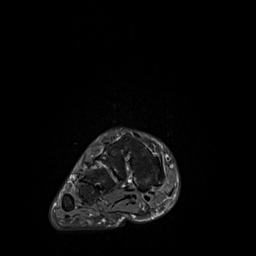
[im 14/40]
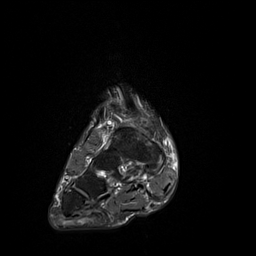
[im 18/40]
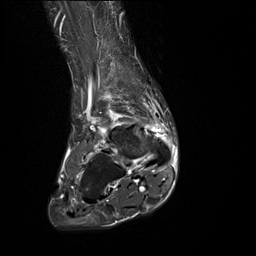
[im 22/40]
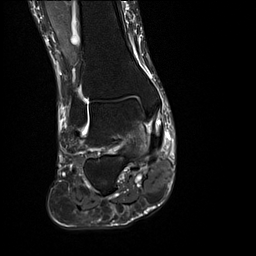
[im 27/40]
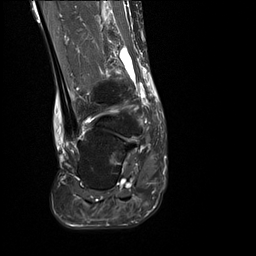
[im 31/40]
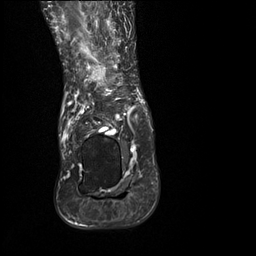
[im 35/40]
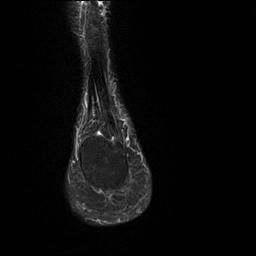
[im 40/40]
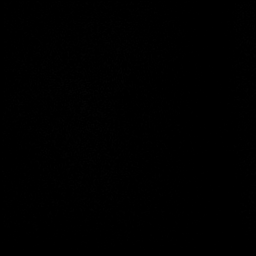

[Series 8: T1 · sagittal · right · 4.0mm · 0.70mm/px · 5 of 21 slices shown]
[im 1/21]
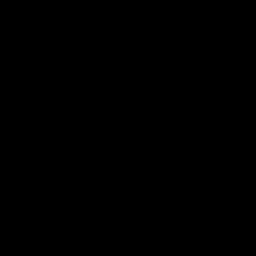
[im 6/21]
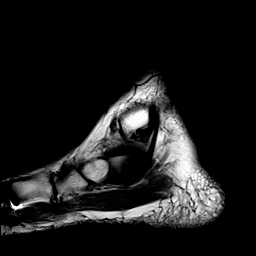
[im 11/21]
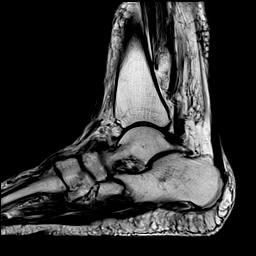
[im 16/21]
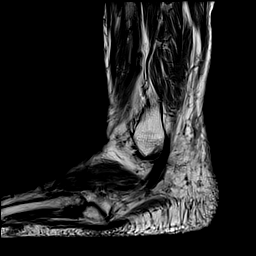
[im 21/21]
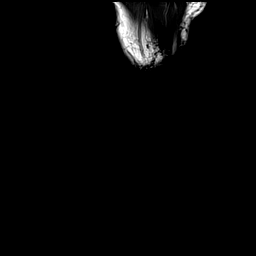

[Series 9: STIR · sagittal · right · 4.0mm · 0.35mm/px · 5 of 21 slices shown]
[im 1/21]
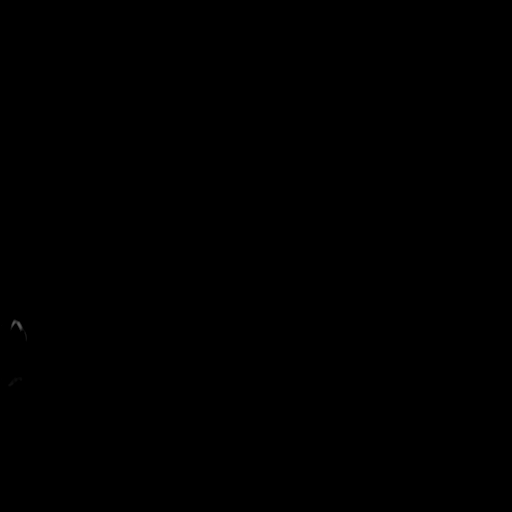
[im 6/21]
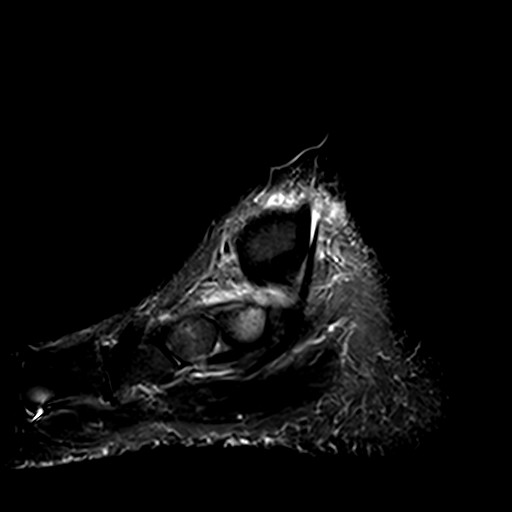
[im 11/21]
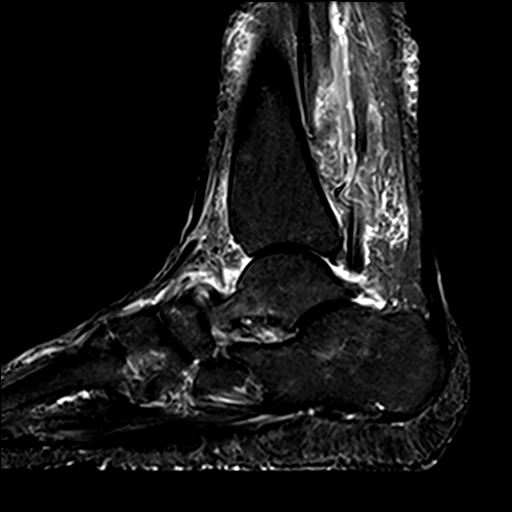
[im 16/21]
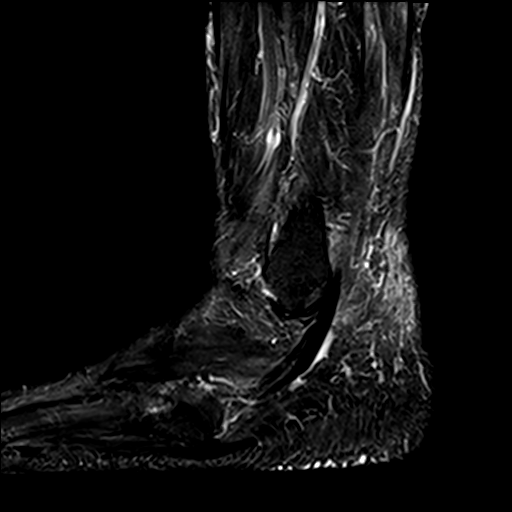
[im 21/21]
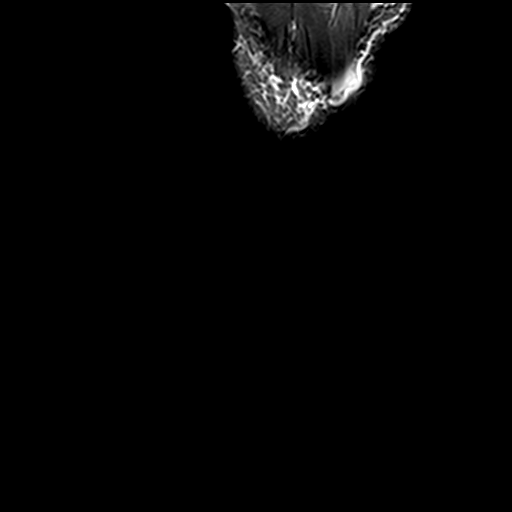

[40 of 40 positions shown; findings below may reference images not displayed]

FINDINGS: TENDONS

Peroneal: Intact.

Posteromedial: Intact.

Anterior: Intact.

Achilles: Intact.

Plantar Fascia: Intact.  No plantar fasciitis.

LIGAMENTS

Lateral: Intact.

Medial: Intact.

CARTILAGE

Ankle Joint: Normal. No osteochondral lesion of the talar dome or
joint effusion.

Subtalar Joints/Sinus Tarsi: Normal.

Bones: There is marrow edema in the neck and head of the talus
eccentric toward the medial side. No fracture is identified.
Mild-to-moderate appearing midfoot osteoarthritis is worst at the
third tarsometatarsal joint. Artifact about the distal first
metatarsal from surgical hardware seen on the prior plain films is
noted.

Other: None.
IMPRESSION: Marrow edema in the medial head and neck of the talus is nonspecific
and may be due to stress change. No fracture is identified.

Negative for tendon or ligament tear.

Mild to moderate midfoot osteoarthritis.

## 2021-04-03 ENCOUNTER — Other Ambulatory Visit: Payer: Self-pay

## 2021-04-03 ENCOUNTER — Encounter: Payer: Self-pay | Admitting: Urology

## 2021-04-03 ENCOUNTER — Ambulatory Visit: Payer: Medicare Other | Admitting: Urology

## 2021-04-03 VITALS — BP 122/76 | HR 82 | Ht 61.0 in | Wt 151.0 lb

## 2021-04-03 DIAGNOSIS — R3129 Other microscopic hematuria: Secondary | ICD-10-CM | POA: Diagnosis not present

## 2021-04-03 DIAGNOSIS — N3946 Mixed incontinence: Secondary | ICD-10-CM | POA: Diagnosis not present

## 2021-04-03 DIAGNOSIS — R3915 Urgency of urination: Secondary | ICD-10-CM | POA: Diagnosis not present

## 2021-04-03 LAB — MICROSCOPIC EXAMINATION: WBC, UA: >30 /hpf — AB (ref 0–5)

## 2021-04-03 LAB — URINALYSIS, COMPLETE
Bilirubin, UA: NEGATIVE
Glucose, UA: NEGATIVE
Ketones, UA: NEGATIVE
Nitrite, UA: POSITIVE — AB
Protein,UA: NEGATIVE
Specific Gravity, UA: 1.025 (ref 1.005–1.030)
Urobilinogen, Ur: 0.2 mg/dL (ref 0.2–1.0)
pH, UA: 6 (ref 5.0–7.5)

## 2021-04-03 MED ORDER — MIRABEGRON ER 50 MG PO TB24: 50.0000 mg | ORAL_TABLET | Freq: Every day | ORAL | 11 refills | Status: AC

## 2021-04-03 NOTE — Progress Notes (Signed)
04/03/2021 9:57 AM   Jennifer Rubio [birth year] [id number]  Referring provider: Enid Baas, MD [address] [place],  [place] [address]  Chief Complaint  Patient presents with   Over Active Bladder    New Patient    HPI: I was consulted to assess the patient's for sediment in the urine.  She was not a good historian.  She voids every 2-3 hours.  She gets up once a night.  She has intermittent urge incontinence.  No bedwetting or stress incontinence.  She is a poor historian.  It turns out she did have bladder surgery 7 or 8 years ago.  She wears 3 pads a day but they are not very wet  It looks like she may have red blood cells in the urine when I looked through the medical record.  Passed a kidney stone many years ago.  Has had a hysterectomy  No history of bladder infections    PMH: Past Medical History:  Diagnosis Date   Cellulitis and abscess of leg January 2013   Hypertension    Osteoarthritis    Urinary tract infection January 2013    Surgical History: Past Surgical History:  Procedure Laterality Date   ABDOMINAL HYSTERECTOMY     BLADDER SUSPENSION  11/26/2011   Procedure: TRANSVAGINAL TAPE (TVT) PROCEDURE;  Surgeon: Genia Del, MD;  Location: [facility];  Service: Gynecology;  Laterality: N/A;   BREAST CYST ASPIRATION Right    BUNIONECTOMY     CYSTOSCOPY  11/26/2011   Procedure: CYSTOSCOPY;  Surgeon: Genia Del, MD;  Location: [facility];  Service: Gynecology;  Laterality: N/A;   KNEE ARTHROSCOPY     both knees   RECTOCELE REPAIR  11/26/2011   Procedure: POSTERIOR REPAIR (RECTOCELE);  Surgeon: Genia Del, MD;  Location: [facility];  Service: Gynecology;  Laterality: N/A;   ROBOTIC ASSISTED LAPAROSCOPIC SACROCOLPOPEXY  11/26/2011   Procedure: ROBOTIC ASSISTED LAPAROSCOPIC SACROCOLPOPEXY;  Surgeon: Genia Del, MD;  Location: [facility];  Service: Gynecology;  Laterality: N/A;    Home Medications:  Allergies as of 04/03/2021        Reactions   Sulfa Antibiotics Anaphylaxis   Swelling        Medication List        Accurate as of April 03, 2021  9:57 AM. If you have any questions, ask your nurse or doctor.          amLODipine 2.5 MG tablet Commonly known as: NORVASC Take 2.5 mg by mouth daily.   aspirin EC 81 MG tablet Take 81 mg by mouth.   cyclobenzaprine 5 MG tablet Commonly known as: FLEXERIL Take 1 tablet (5 mg total) by mouth 3 (three) times daily as needed for muscle spasms.   diphenhydrAMINE 25 mg capsule Commonly known as: Benadryl Allergy Take 1 capsule (25 mg total) by mouth every 6 (six) hours as needed for up to 5 days.   famotidine 20 MG tablet Commonly known as: PEPCID Take 1 tablet (20 mg total) by mouth 2 (two) times daily for 5 days.   HYDROcodone-acetaminophen 5-325 MG tablet Commonly known as: Norco Take 1 tablet by mouth every 6 (six) hours as needed for moderate pain.   ibuprofen 800 MG tablet Commonly known as: ADVIL Take 1 tablet (800 mg total) by mouth every 8 (eight) hours as needed for moderate pain.   lidocaine 5 % Commonly known as: Lidoderm Place 1 patch onto the skin every 12 (twelve) hours. Remove & Discard patch within 12 hours  or as directed by MD   losartan-hydrochlorothiazide 50-12.5 MG tablet Commonly known as: HYZAAR TAKE 1 TABLET BY MOUTH DAILY.   methocarbamol 500 MG tablet Commonly known as: ROBAXIN Take 1 tablet (500 mg total) by mouth 2 (two) times daily as needed.   nystatin-triamcinolone cream Commonly known as: MYCOLOG II Apply to affected area daily   oxybutynin 5 MG 24 hr tablet Commonly known as: DITROPAN-XL Take 5 mg by mouth daily.   tizanidine 2 MG capsule Commonly known as: ZANAFLEX Take 2 mg by mouth as needed for muscle spasms.   Vitamin D (Ergocalciferol) 1.25 MG (50000 UNIT) Caps capsule Commonly known as: DRISDOL Take by mouth.        Allergies:  Allergies  Allergen Reactions   Sulfa Antibiotics Anaphylaxis     Swelling     Family History: Family History  Problem Relation Age of Onset   Hypertension Father    Diabetes Sister    Cancer Sister        breast   Breast cancer Sister 45   Diabetes Brother    Cancer Sister        colon   Breast cancer Other     Social History:  reports that she has never smoked. She has never used smokeless tobacco. She reports that she does not drink alcohol and does not use drugs.  ROS:                                        Physical Exam: BP 122/76   Pulse 82   Ht 5\' 1"  (1.549 m)   Wt 68.5 kg   LMP  (LMP Unknown)   BMI 28.53 kg/m   Constitutional:  Alert and oriented, No acute distress. HEENT: [place] AT, moist mucus membranes.  Trachea midline, no masses. Cardiovascular: No clubbing, cyanosis, or edema. Respiratory: Normal respiratory effort, no increased work of breathing. GI: Abdomen is soft, nontender, nondistended, no abdominal masses GU: Patient had moderate grade 2 asymptomatic cystocele and no stress incontinence. Skin: No rashes, bruises or suspicious lesions. Lymph: No cervical or inguinal adenopathy. Neurologic: Grossly intact, no focal deficits, moving all 4 extremities. Psychiatric: Normal mood and affect.  Laboratory Data: Lab Results  Component Value Date   WBC 6.7 10/14/2013   HGB 11.0 (L) 10/21/2013   HCT 37.5 10/14/2013   MCV 85 10/14/2013   PLT 296 10/14/2013    Lab Results  Component Value Date   CREATININE 0.97 10/24/2013    No results found for: PSA  No results found for: TESTOSTERONE  Lab Results  Component Value Date   HGBA1C 5.9 (H) 10/20/2011    Urinalysis    Component Value Date/Time   COLORURINE Yellow 10/14/2013 1430   COLORURINE YELLOW 10/20/2011 0420   APPEARANCEUR Cloudy 10/14/2013 1430   LABSPEC 1.024 10/14/2013 1430   PHURINE 5.0 10/14/2013 1430   PHURINE 5.5 10/20/2011 0420   GLUCOSEU Negative 10/14/2013 1430   HGBUR 2+ 10/14/2013 1430   HGBUR MODERATE (A)  10/20/2011 0420   BILIRUBINUR Negative 10/14/2013 1430   KETONESUR Negative 10/14/2013 1430   KETONESUR NEGATIVE 10/20/2011 0420   PROTEINUR Negative 10/14/2013 1430   PROTEINUR NEGATIVE 10/20/2011 0420   UROBILINOGEN 0.2 10/20/2011 0420   NITRITE Positive 10/14/2013 1430   NITRITE POSITIVE (A) 10/20/2011 0420   LEUKOCYTESUR 2+ 10/14/2013 1430    Pertinent Imaging: Chart reviewed.  Urine reviewed.  Urine  sent for culture.  Assessment & Plan: Patient is a poor historian but appears that she has mild urge incontinence.  On urinalysis today and last urinalysis she had microscopic hematuria.  Work-up for hematuria discussed.  Role of treating overactive bladder discussed.  Call if urine culture positive.  CT scan ordered.  Come back in 6 weeks for pelvic examination and cystoscopy.  I gave the patient Myrbetriq 50 mg samples and prescription.  1. Urinary urgency  - Urinalysis, Complete   No follow-ups on file.  Martina Sinner, MD  [facility] [address], [address] [place], [place] [address] [phone]

## 2021-04-06 LAB — CULTURE, URINE COMPREHENSIVE

## 2021-04-10 ENCOUNTER — Telehealth: Payer: Self-pay

## 2021-04-10 DIAGNOSIS — R3129 Other microscopic hematuria: Secondary | ICD-10-CM

## 2021-04-10 MED ORDER — NITROFURANTOIN MACROCRYSTAL 100 MG PO CAPS: 100.0000 mg | ORAL_CAPSULE | Freq: Two times a day (BID) | ORAL | 0 refills | Status: AC

## 2021-04-10 NOTE — Telephone Encounter (Signed)
-----   Message from Alfredo Martinez, MD sent at 04/09/2021  9:43 AM EDT ----- Macrodantin 100 mg bid for 7 days  ----- Message ----- From: Lizbeth Bark, CMA Sent: 04/06/2021   4:43 PM EDT To: Alfredo Martinez, MD   ----- Message ----- From: Interface, [facility] Lab Results In Sent: 04/03/2021   4:36 PM EDT To: Jennette K[facility]

## 2021-04-10 NOTE — Telephone Encounter (Signed)
Notified patient as advised, verified pharmacy, ABX sent, patient verbalized understanding.

## 2021-05-15 ENCOUNTER — Emergency Department
Admission: EM | Admit: 2021-05-15 | Discharge: 2021-05-15 | Disposition: A | Discharge status: Home / Self Care | Payer: Medicare Other | Attending: Emergency Medicine | Admitting: Emergency Medicine

## 2021-05-15 ENCOUNTER — Other Ambulatory Visit: Payer: Self-pay

## 2021-05-15 ENCOUNTER — Other Ambulatory Visit: Payer: Self-pay | Admitting: Urology

## 2021-05-15 ENCOUNTER — Emergency Department: Payer: Medicare Other

## 2021-05-15 DIAGNOSIS — R519 Headache, unspecified: Secondary | ICD-10-CM | POA: Diagnosis present

## 2021-05-15 DIAGNOSIS — U071 COVID-19: Secondary | ICD-10-CM

## 2021-05-15 DIAGNOSIS — I10 Essential (primary) hypertension: Secondary | ICD-10-CM | POA: Diagnosis not present

## 2021-05-15 DIAGNOSIS — Z79899 Other long term (current) drug therapy: Secondary | ICD-10-CM | POA: Insufficient documentation

## 2021-05-15 DIAGNOSIS — Z7982 Long term (current) use of aspirin: Secondary | ICD-10-CM | POA: Insufficient documentation

## 2021-05-15 DIAGNOSIS — Z8616 Personal history of COVID-19: Secondary | ICD-10-CM

## 2021-05-15 HISTORY — DX: Personal history of COVID-19: Z86.16

## 2021-05-15 LAB — COMPREHENSIVE METABOLIC PANEL
ALT: 16 U/L (ref 0–44)
AST: 19 U/L (ref 15–41)
Albumin: 4 g/dL (ref 3.5–5.0)
Alkaline Phosphatase: 86 U/L (ref 38–126)
Anion gap: 8 (ref 5–15)
BUN: 10 mg/dL (ref 8–23)
CO2: 25 mmol/L (ref 22–32)
Calcium: 8.8 mg/dL — ABNORMAL LOW (ref 8.9–10.3)
Chloride: 102 mmol/L (ref 98–111)
Creatinine, Ser: 0.72 mg/dL (ref 0.44–1.00)
GFR, Estimated: >60 mL/min (ref >60)
Glucose, Bld: 107 mg/dL — ABNORMAL HIGH (ref 70–99)
Potassium: 3.9 mmol/L (ref 3.5–5.1)
Sodium: 135 mmol/L (ref 135–145)
Total Bilirubin: 0.4 mg/dL (ref 0.3–1.2)
Total Protein: 7.3 g/dL (ref 6.5–8.1)

## 2021-05-15 LAB — CBC WITH DIFFERENTIAL/PLATELET
Abs Immature Granulocytes: 0.02 10*3/uL (ref 0.00–0.07)
Basophils Absolute: 0 10*3/uL (ref 0.0–0.1)
Basophils Relative: 1 %
Eosinophils Absolute: 0 10*3/uL (ref 0.0–0.5)
Eosinophils Relative: 0 %
HCT: 38.9 % (ref 36.0–46.0)
Hemoglobin: 12.8 g/dL (ref 12.0–15.0)
Immature Granulocytes: 0 %
Lymphocytes Relative: 6 %
Lymphs Abs: 0.4 10*3/uL — ABNORMAL LOW (ref 0.7–4.0)
MCH: 28.7 pg (ref 26.0–34.0)
MCHC: 32.9 g/dL (ref 30.0–36.0)
MCV: 87.2 fL (ref 80.0–100.0)
Monocytes Absolute: 0.6 10*3/uL (ref 0.1–1.0)
Monocytes Relative: 9 %
Neutro Abs: 5.8 10*3/uL (ref 1.7–7.7)
Neutrophils Relative %: 84 %
Platelets: 245 10*3/uL (ref 150–400)
RBC: 4.46 MIL/uL (ref 3.87–5.11)
RDW: 13.9 % (ref 11.5–15.5)
WBC: 6.9 10*3/uL (ref 4.0–10.5)
nRBC: 0 % (ref 0.0–0.2)

## 2021-05-15 LAB — RESP PANEL BY RT-PCR (FLU A&B, COVID) ARPGX2
Influenza A by PCR: NEGATIVE
Influenza B by PCR: NEGATIVE
SARS Coronavirus 2 by RT PCR: POSITIVE — AB

## 2021-05-15 IMAGING — CT CT HEAD W/O CM
3 series · 16 of 44 positions shown, 19 images · non-contrast
Comparison: [DATE]

CLINICAL DATA: Headache

EXAM:
CT HEAD WITHOUT CONTRAST
TECHNIQUE: Contiguous axial images were obtained from the base of the skull
through the vertex without intravenous contrast.

[Series 3: head wo · axial · 0.41mm/px · z∈[+464,+574]mm · 10 of 27 slices shown, 13 images]
[im 3/27  brain]
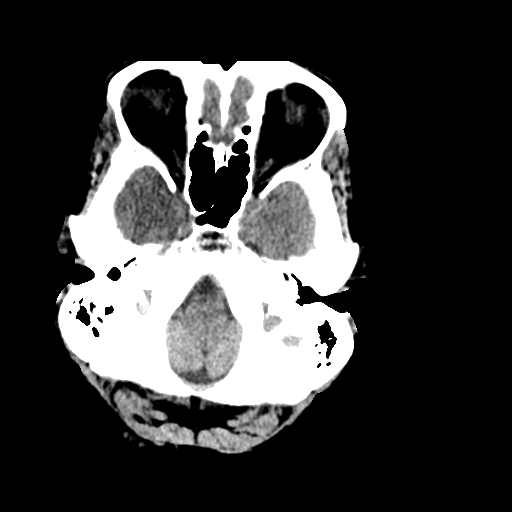
[im 3/27  bone]
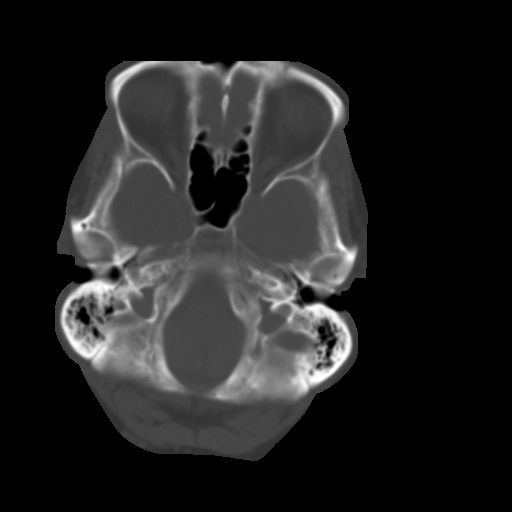
[im 5/27  brain]
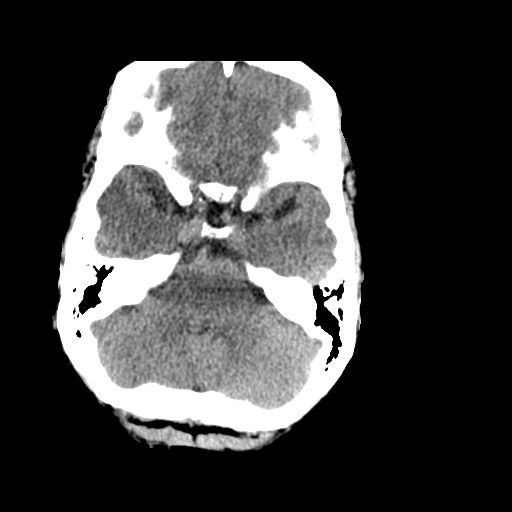
[im 8/27  brain]
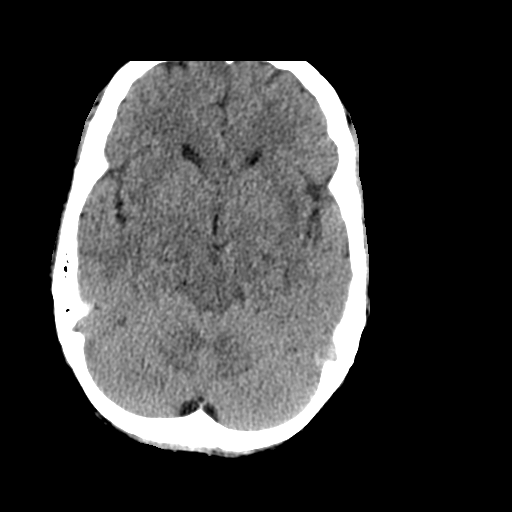
[im 10/27  brain]
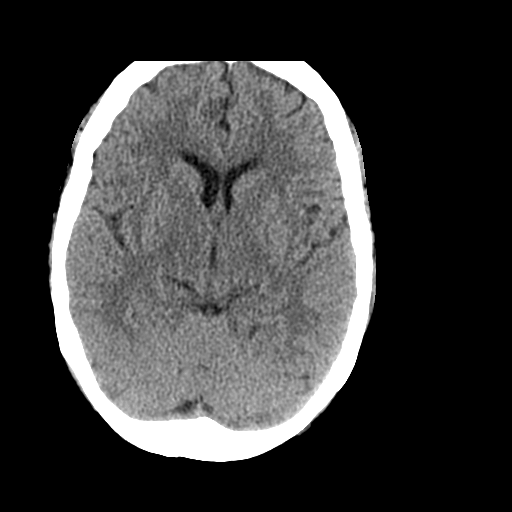
[im 13/27  brain]
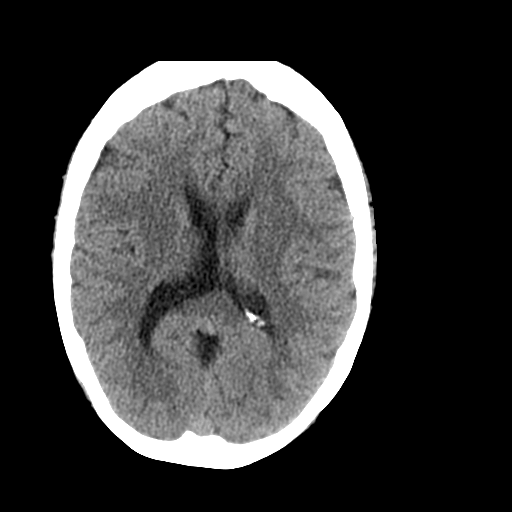
[im 13/27  bone]
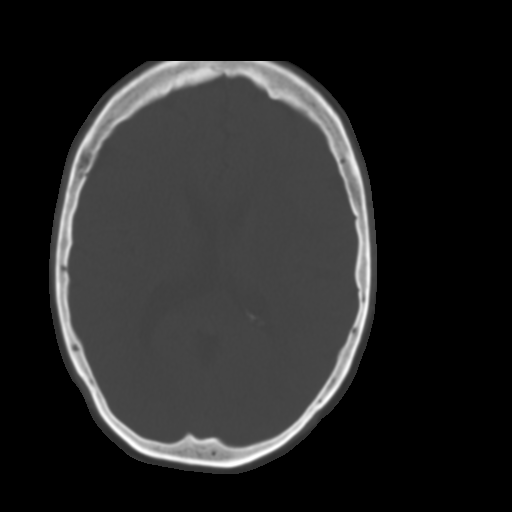
[im 15/27  brain]
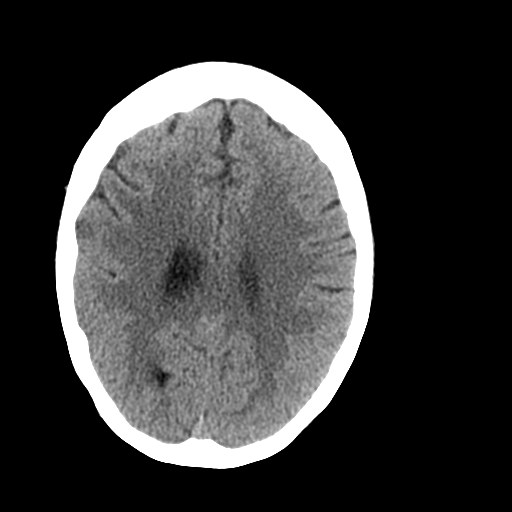
[im 18/27  brain]
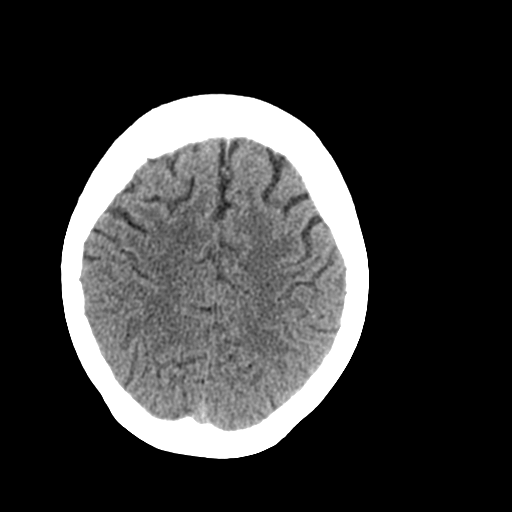
[im 20/27  brain]
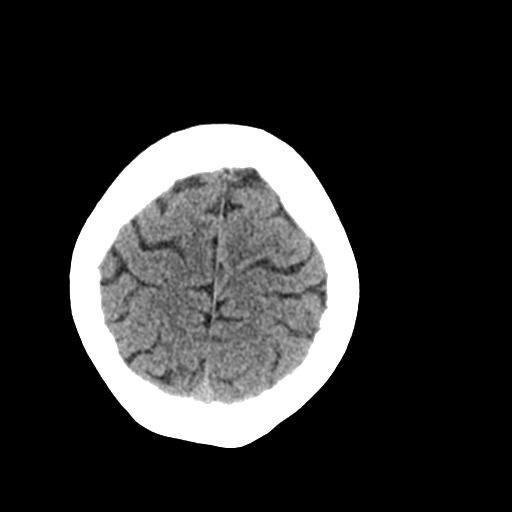
[im 23/27  brain]
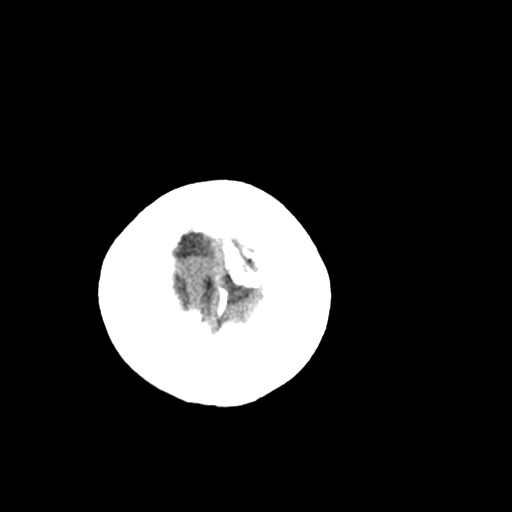
[im 23/27  bone]
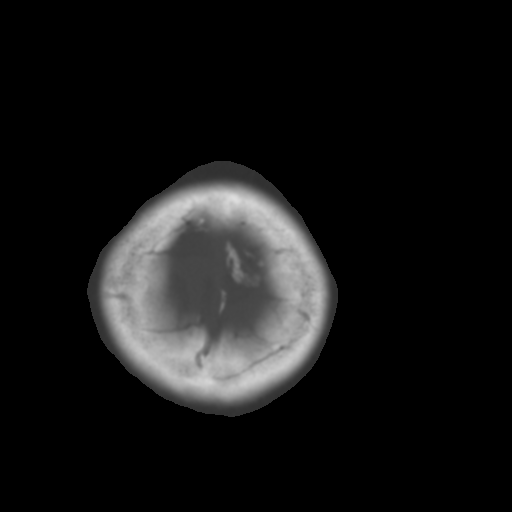
[im 25/27  brain]
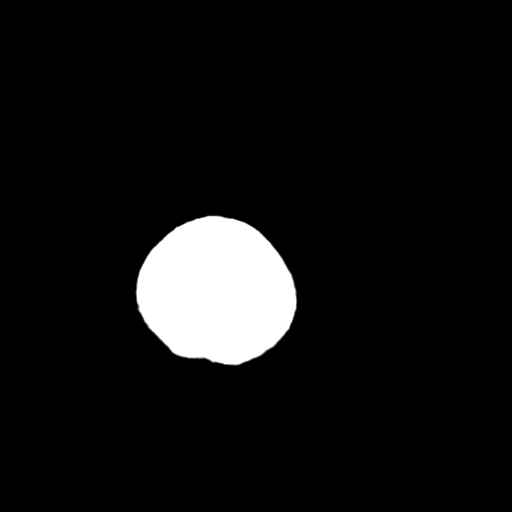

[Series 4: coronal soft tissue · coronal · 0.28mm/px · 3 of 63 slices shown]
[im 21/63  brain]
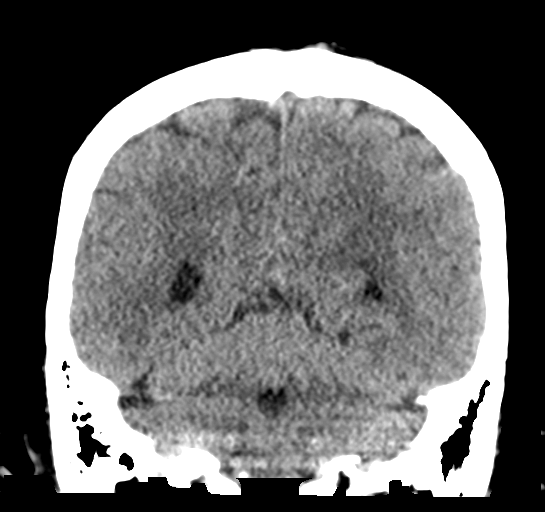
[im 28/63  brain]
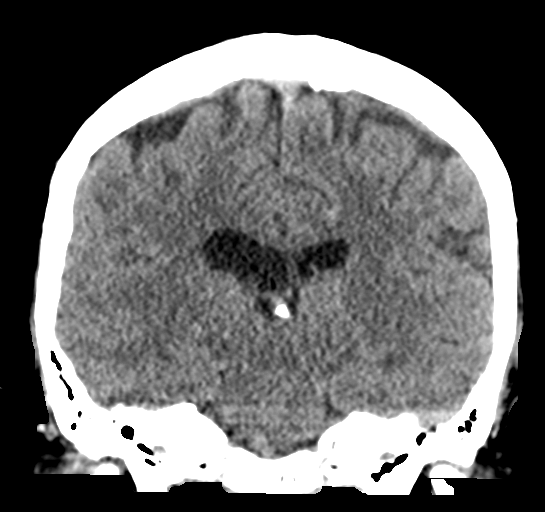
[im 35/63  brain]
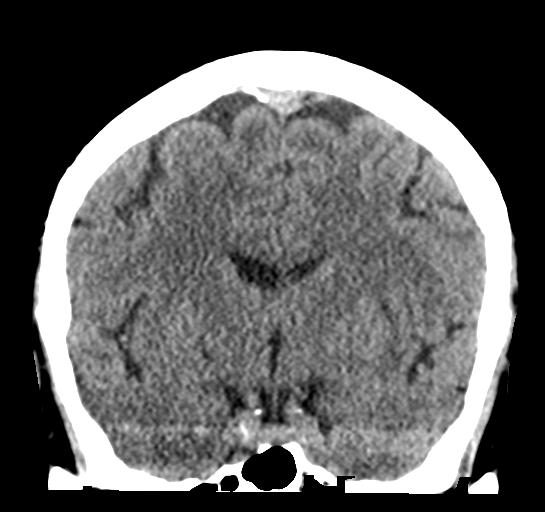

[Series 5: sagittal soft tissue · sagittal · 0.28mm/px · 3 of 50 slices shown]
[im 17/50  brain]
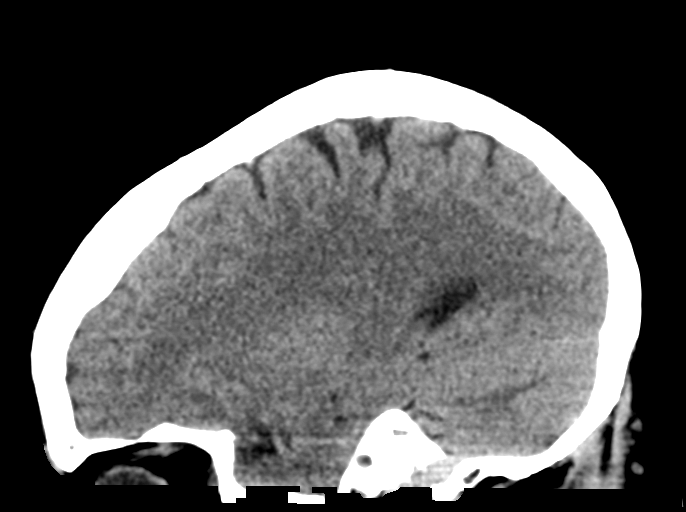
[im 25/50  brain]
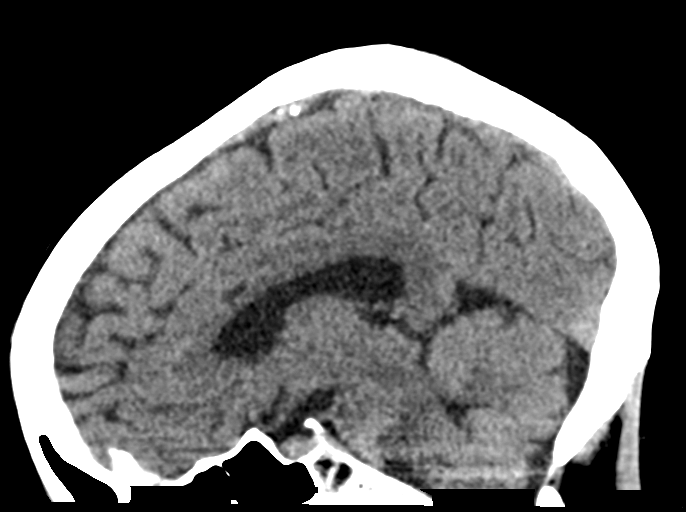
[im 33/50  brain]
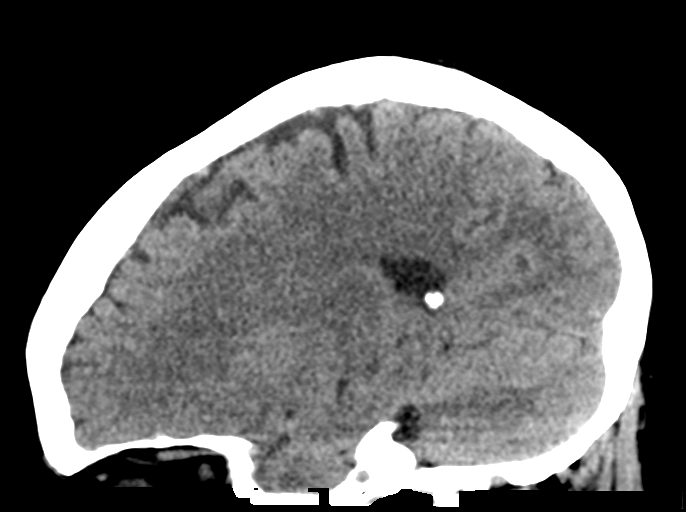

[16 of 44 positions shown; findings below may reference images not displayed]

FINDINGS: Brain: No evidence of acute infarction, hemorrhage, hydrocephalus,
extra-axial collection or mass lesion/mass effect.

Vascular: No hyperdense vessel or unexpected calcification.

Skull: Normal. Negative for fracture or focal lesion.

Sinuses/Orbits: No acute finding.

Other: None
IMPRESSION: Negative

## 2021-05-15 MED ORDER — PAXLOVID (150/100) 10 X 150 MG & 10 X 100MG PO TBPK: 3.0000 | ORAL_TABLET | Freq: Two times a day (BID) | ORAL | 0 refills | Status: DC

## 2021-05-15 MED ORDER — ACETAMINOPHEN 325 MG PO TABS
650.0000 mg | ORAL_TABLET | Freq: Once | ORAL | Status: AC
  Administered 2021-05-15: 650 mg via ORAL
  Filled 2021-05-15: qty 2

## 2021-05-15 NOTE — ED Notes (Signed)
Verbalized understanding discharge instructions. In no acute distress.   

## 2021-05-15 NOTE — Discharge Instructions (Addendum)
You do have COVID.  You can use Tylenol as needed for headache.  Please return for high fever or feeling sicker or shortness of breath or any other complaints.  You should remain at home and quarantine from everybody for at least 5 days.  If you have to go out please make sure you get an N95 mask and wear it.  You can take the Paxil Oved 3 pills twice a day to see if it helps with the COVID.

## 2021-05-15 NOTE — ED Notes (Signed)
Pt presents to ED with c/o of having a headache that started last night. Pt denies blurry vision or any visual disturbances. Pt denies SOB or chest pain to this RN.   Pt states known COVID exposure to daughters boyfriend. NAD noted. Pt denies fevers or chills.

## 2021-05-15 NOTE — ED Provider Notes (Signed)
[facility] Emergency Department Provider Note   ____________________________________________   Event Date/Time   First MD Initiated Contact with Patient 05/15/21 1207     (approximate)  I have reviewed the triage vital signs and the nursing notes.   HISTORY  Chief Complaint Covid Exposure   HPI Jennifer Rubio is a 70 y.o. female who says that her daughters boyfriend had COVID.  She was exposed.  She went to [place] [facility] and the staff reported she was not answering questions appropriately for them.  She came in here after being brought here by [facility] and complained of severe headache 10 out of 10.  She says she has never had a headache like this before but it came on gradually beginning last night.  She is not having any nausea or vomiting.  Nothing seems to make the headache better or worse.  She is not having any nasal stuffiness.  She is not running a fever.  She does not have any blurry vision or chest pain or shortness of breath or any other complaints.        Past Medical History:  Diagnosis Date   Cellulitis and abscess of leg January 2013   Hypertension    Osteoarthritis    Urinary tract infection January 2013    Patient Active Problem List   Diagnosis Date Noted   Muscle strain of left lower extremity 06/29/2015   Anemia 10/21/2011   UTI (lower urinary tract infection) 10/21/2011   Cellulitis of knee, left 10/19/2011   HTN (hypertension), benign 10/19/2011   Obesity 10/19/2011   Osteoarthritis 10/19/2011   Hyperglycemia 10/19/2011    Past Surgical History:  Procedure Laterality Date   ABDOMINAL HYSTERECTOMY     BLADDER SUSPENSION  11/26/2011   Procedure: TRANSVAGINAL TAPE (TVT) PROCEDURE;  Surgeon: Genia Del, MD;  Location: [facility];  Service: Gynecology;  Laterality: N/A;   BREAST CYST ASPIRATION Right    BUNIONECTOMY     CYSTOSCOPY  11/26/2011   Procedure: CYSTOSCOPY;  Surgeon: Genia Del, MD;  Location: [facility];  Service: Gynecology;  Laterality: N/A;   KNEE ARTHROSCOPY     both knees   RECTOCELE REPAIR  11/26/2011   Procedure: POSTERIOR REPAIR (RECTOCELE);  Surgeon: Genia Del, MD;  Location: [facility];  Service: Gynecology;  Laterality: N/A;   ROBOTIC ASSISTED LAPAROSCOPIC SACROCOLPOPEXY  11/26/2011   Procedure: ROBOTIC ASSISTED LAPAROSCOPIC SACROCOLPOPEXY;  Surgeon: Genia Del, MD;  Location: [facility];  Service: Gynecology;  Laterality: N/A;    Prior to Admission medications   Medication Sig Start Date End Date Taking? Authorizing Provider  nirmatrelvir & ritonavir (PAXLOVID, 150/100,) 10 x 150 MG & 10 x 100MG  TBPK Take 3 tablets by mouth 2 (two) times daily. 05/15/21  Yes 05/17/21, MD  amLODipine (NORVASC) 2.5 MG tablet Take 2.5 mg by mouth daily. 03/30/21   [provider]  aspirin EC 81 MG tablet Take 81 mg by mouth.    [provider]  cyclobenzaprine (FLEXERIL) 5 MG tablet Take 1 tablet (5 mg total) by mouth 3 (three) times daily as needed for muscle spasms. Patient not taking: Reported on 04/03/2021 09/19/20   09/21/20, MD  diphenhydrAMINE (BENADRYL ALLERGY) 25 mg capsule Take 1 capsule (25 mg total) by mouth every 6 (six) hours as needed for up to 5 days. 10/18/18 10/23/18  10/25/18, PA-C  famotidine (PEPCID) 20 MG tablet Take 1 tablet (20 mg total) by mouth 2 (two) times daily for  5 days. 10/18/18 10/23/18  Orvil Feil, PA-C  HYDROcodone-acetaminophen (NORCO) 5-325 MG tablet Take 1 tablet by mouth every 6 (six) hours as needed for moderate pain. Patient not taking: Reported on 04/03/2021 11/11/16   Irean Hong, MD  ibuprofen (ADVIL,MOTRIN) 800 MG tablet Take 1 tablet (800 mg total) by mouth every 8 (eight) hours as needed for moderate pain. Patient not taking: Reported on 04/03/2021 11/11/16   Irean Hong, MD  lidocaine (LIDODERM) 5 % Place 1 patch onto the skin every 12 (twelve) hours. Remove & Discard patch within 12 hours or as directed by MD  09/19/20 09/19/21  Chesley Noon, MD  losartan-hydrochlorothiazide (HYZAAR) 50-12.5 MG tablet TAKE 1 TABLET BY MOUTH DAILY. Patient not taking: Reported on 04/03/2021 12/26/15   Gabriel Cirri, NP  methocarbamol (ROBAXIN) 500 MG tablet Take 1 tablet (500 mg total) by mouth 2 (two) times daily as needed. Patient not taking: Reported on 04/03/2021 07/27/16   Barrett Henle, PA-C  mirabegron ER (MYRBETRIQ) 50 MG TB24 tablet Take 1 tablet (50 mg total) by mouth daily. 04/03/21   Alfredo Martinez, MD  nystatin-triamcinolone (MYCOLOG II) cream Apply to affected area daily Patient not taking: Reported on 04/03/2021 01/17/18   Geoffery Lyons, MD  oxybutynin (DITROPAN-XL) 5 MG 24 hr tablet Take 5 mg by mouth daily. 03/30/21   [provider]  tizanidine (ZANAFLEX) 2 MG capsule Take 2 mg by mouth as needed for muscle spasms. Patient not taking: Reported on 04/03/2021    [provider]  Vitamin D, Ergocalciferol, (DRISDOL) 1.25 MG (50000 UNIT) CAPS capsule Take by mouth. Patient not taking: Reported on 04/03/2021 02/13/21   [provider]    Allergies Sulfa antibiotics  Family History  Problem Relation Age of Onset   Hypertension Father    Diabetes Sister    Cancer Sister        breast   Breast cancer Sister 31   Diabetes Brother    Cancer Sister        colon   Breast cancer Other     Social History Social History   Tobacco Use   Smoking status: Never   Smokeless tobacco: Never  Substance Use Topics   Alcohol use: No   Drug use: No    Review of Systems  Constitutional: No fever/chills Eyes: No visual changes. ENT: No sore throat. Cardiovascular: Denies chest pain. Respiratory: Denies shortness of breath. Gastrointestinal: No abdominal pain.  No nausea, no vomiting.  No diarrhea.  No constipation. Genitourinary: Negative for dysuria. Musculoskeletal: Negative for back pain. Skin: Negative for rash. Neurological: Negative for focal weakness    ____________________________________________   PHYSICAL EXAM:  VITAL SIGNS: ED Triage Vitals  Enc Vitals Group     BP 05/15/21 1023 (!) 140/94     Pulse Rate 05/15/21 1023 87     Resp 05/15/21 1023 16     Temp 05/15/21 1023 98.2 F (36.8 C)     Temp Source 05/15/21 1023 Oral     SpO2 05/15/21 1023 96 %     Weight 05/15/21 1023 151 lb (68.5 kg)     Height 05/15/21 1023 5\' 2"  (1.575 m)     Head Circumference --      Peak Flow --      Pain Score 05/15/21 1018 10     Pain Loc --      Pain Edu? --      Excl. in GC? --     Constitutional: Alert and  oriented. Well appearing and in no acute distress. Eyes: Conjunctivae are normal. PERRL. EOMI. fundi appear normal bilaterally Head: Atraumatic.  Birthmark lateral to the left eye Nose: No congestion/rhinnorhea. Mouth/Throat: Mucous membranes are moist.  Oropharynx non-erythematous. Neck: No stridor.  Supple  cardiovascular: Normal rate, regular rhythm. Grossly normal heart sounds.  Good peripheral circulation. Respiratory: Normal respiratory effort.  No retractions. Lungs CTAB. Gastrointestinal: Soft and nontender. No distention. No abdominal bruits Musculoskeletal: No lower extremity tenderness nor edema.   Neurologic:  Normal speech and language. No gross focal neurologic deficits are appreciated.  Cranial nerves II through XII are intact although visual fields were not checked.  Motor strength is 5/5 throughout.  Rapid alternating movements in the hands and fingers and nose are normal.  Patient does not report any numbness Skin:  Skin is warm, dry and intact. No rash noted.  ____________________________________________   LABS (all labs ordered are listed, but only abnormal results are displayed)  Labs Reviewed  RESP PANEL BY RT-PCR (FLU A&B, COVID) ARPGX2 - Abnormal; Notable for the following components:      Result Value   SARS Coronavirus 2 by RT PCR POSITIVE (*)    All other components within normal limits   COMPREHENSIVE METABOLIC PANEL - Abnormal; Notable for the following components:   Glucose, Bld 107 (*)    Calcium 8.8 (*)    All other components within normal limits  CBC WITH DIFFERENTIAL/PLATELET - Abnormal; Notable for the following components:   Lymphs Abs 0.4 (*)    All other components within normal limits   ____________________________________________  EKG  EKG read interpreted by me shows normal sinus rhythm normal axis no acute disease ____________________________________________  RADIOLOGY Jill Poling, personally viewed and evaluated these images (plain radiographs) as part of my medical decision making, as well as reviewing the written report by the radiologist.  ED MD interpretation: CT read by radiology reviewed by me is negative  Official radiology report(s): CT HEAD WO CONTRAST ( )  Result Date: 05/15/2021 CLINICAL DATA:  Headache EXAM: CT HEAD WITHOUT CONTRAST TECHNIQUE: Contiguous axial images were obtained from the base of the skull through the vertex without intravenous contrast. COMPARISON:  01/17/2018 FINDINGS: Brain: No evidence of acute infarction, hemorrhage, hydrocephalus, extra-axial collection or mass lesion/mass effect. Vascular: No hyperdense vessel or unexpected calcification. Skull: Normal. Negative for fracture or focal lesion. Sinuses/Orbits: No acute finding. Other: None IMPRESSION: Negative Electronically Signed   By: Corlis Leak M.D.   On: 05/15/2021 13:40    ____________________________________________   PROCEDURES  Procedure(s) performed (including Critical Care):  Procedures   ____________________________________________   INITIAL IMPRESSION / ASSESSMENT AND PLAN / ED COURSE   Patient with COVID.  She is 70 years old I will prescribe her paxlovid.  Pharmacy reviews the patient's meds and renal function with me.             ____________________________________________   FINAL CLINICAL IMPRESSION(S) / ED  DIAGNOSES  Final diagnoses:  COVID     ED Discharge Orders          Ordered    nirmatrelvir & ritonavir (PAXLOVID, 150/100,) 10 x 150 MG & 10 x 100MG  TBPK  2 times daily        05/15/21 1433             Note:  This document was prepared using Dragon voice recognition software and may include unintentional dictation errors.    05/17/21, MD 05/15/21 1440

## 2021-05-15 NOTE — ED Triage Notes (Addendum)
Pt come with c/o covid exposure the patient states 10/10 headache.  Pt brought over by [facility]. [facility] staff states pt wasn't answering questions appropriately for them or not at all.   Pt answering questions with this RN. Pt is A*OX4. Pt just states headache. Pt denies any blurry vision or dizziness. Pt states she just feels weak.

## 2021-05-16 ENCOUNTER — Encounter: Payer: Self-pay | Admitting: Urology

## 2021-06-14 ENCOUNTER — Other Ambulatory Visit: Payer: Self-pay

## 2021-06-14 ENCOUNTER — Ambulatory Visit
Admission: RE | Admit: 2021-06-14 | Discharge: 2021-06-14 | Disposition: A | Discharge status: Home / Self Care | Payer: Medicare Other | Source: Ambulatory Visit | Attending: Urology | Admitting: Urology

## 2021-06-14 DIAGNOSIS — R3129 Other microscopic hematuria: Secondary | ICD-10-CM | POA: Insufficient documentation

## 2021-06-14 IMAGING — CT CT ABD-PEL WO/W CM
3 of 12 series · 11 of 46 positions shown, 17 images · IV contrast (omnipaque)
Comparison: None.

CLINICAL DATA: Microhematuria, urinary urgency and frequency

EXAM:
CT ABDOMEN AND PELVIS WITHOUT AND WITH CONTRAST
TECHNIQUE: Multidetector CT imaging of the abdomen and pelvis was performed
following the standard protocol before and following the bolus
administration of intravenous contrast.
CONTRAST:  100mL OMNIPAQUE IOHEXOL 350 MG/ML SOLN

[Series 2: abd without pre 5.00 · axial · non-contrast · 0.64mm/px · z∈[-1437,-1117]mm · 7 of 86 slices shown, 12 images]
[im 11/86  soft-tissue]
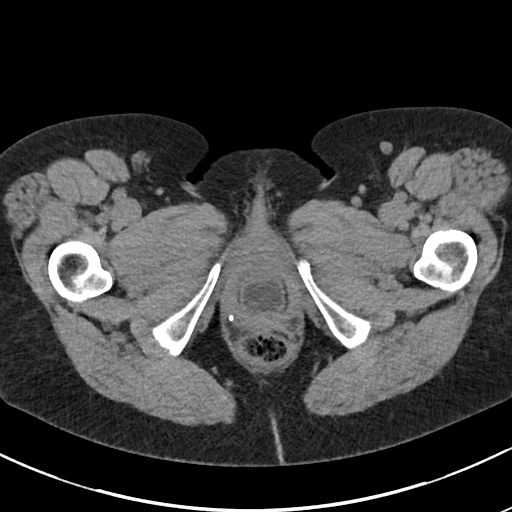
[im 11/86  bone]
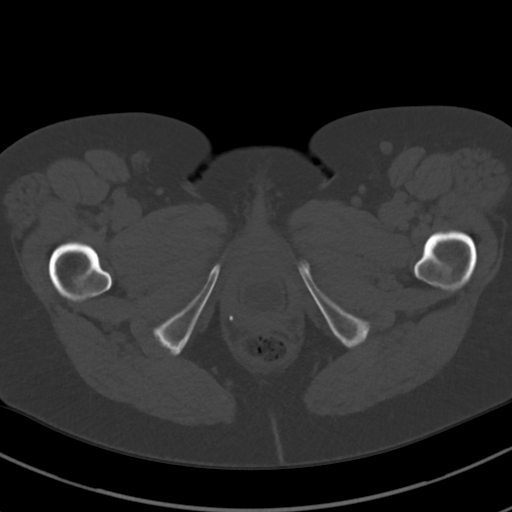
[im 22/86  soft-tissue]
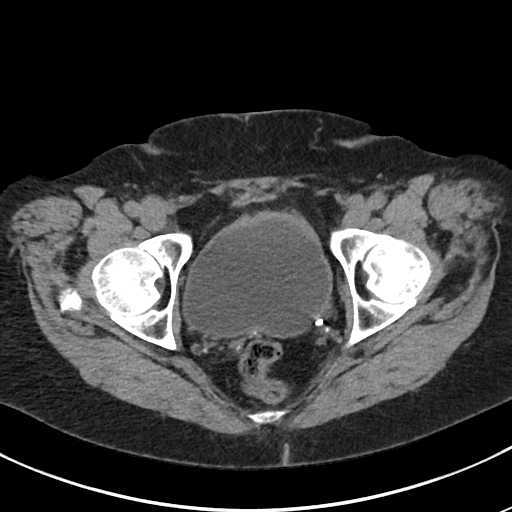
[im 32/86  soft-tissue]
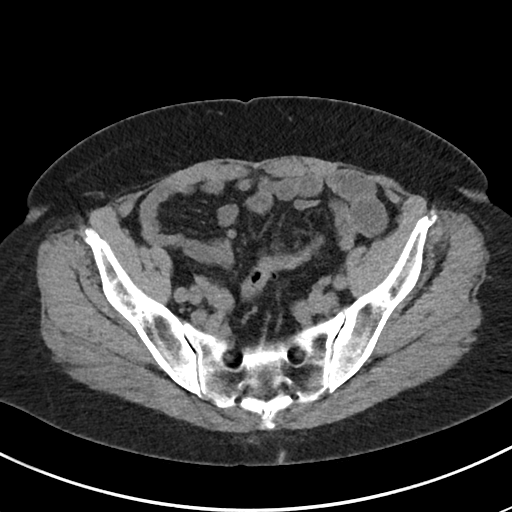
[im 43/86  soft-tissue]
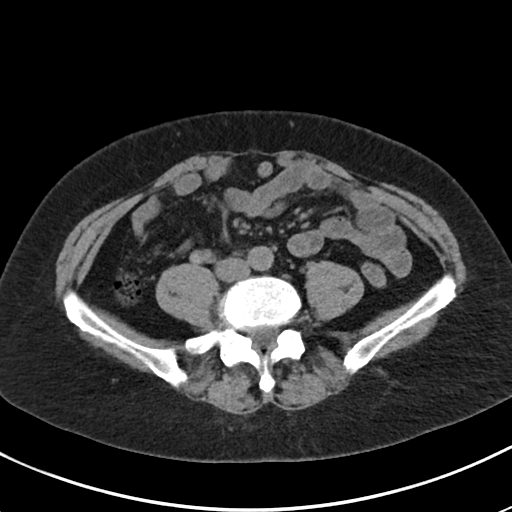
[im 43/86  lung]
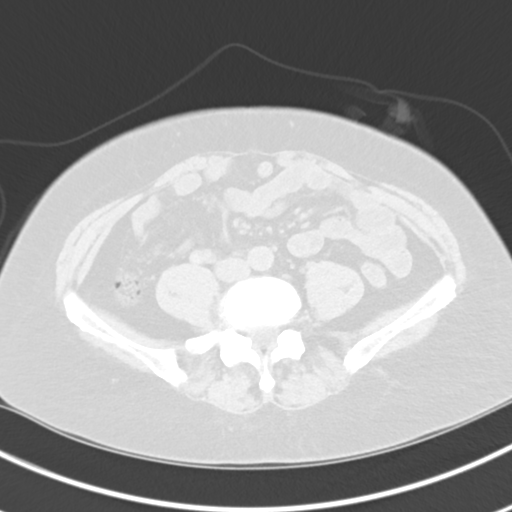
[im 54/86  soft-tissue]
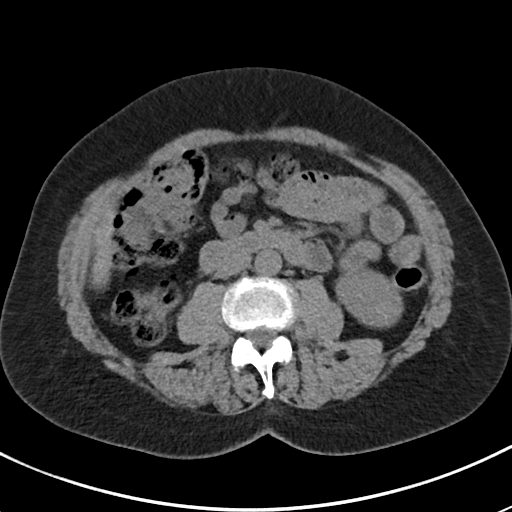
[im 54/86  lung]
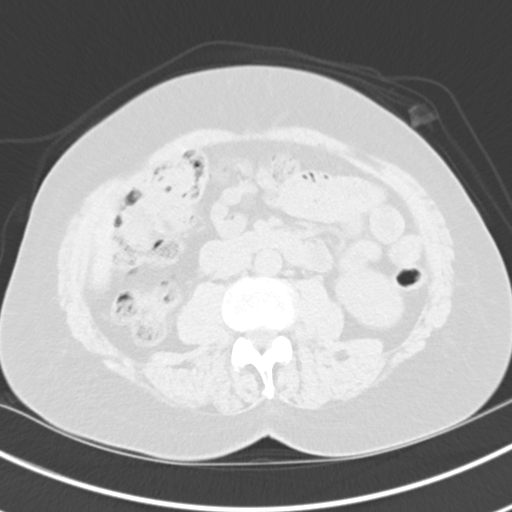
[im 64/86  soft-tissue]
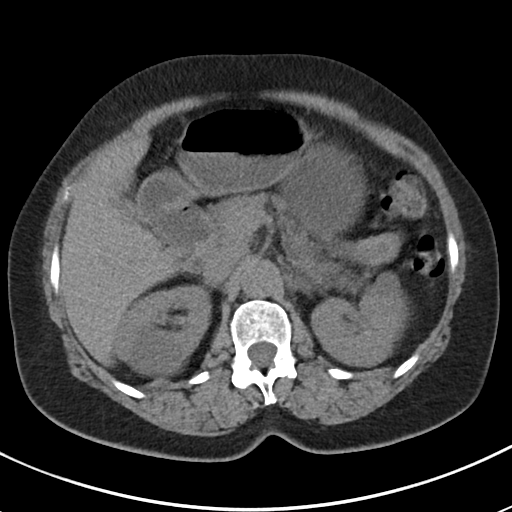
[im 64/86  lung]
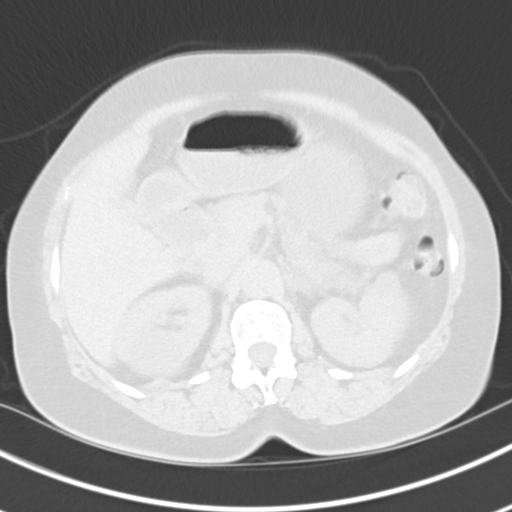
[im 75/86  soft-tissue]
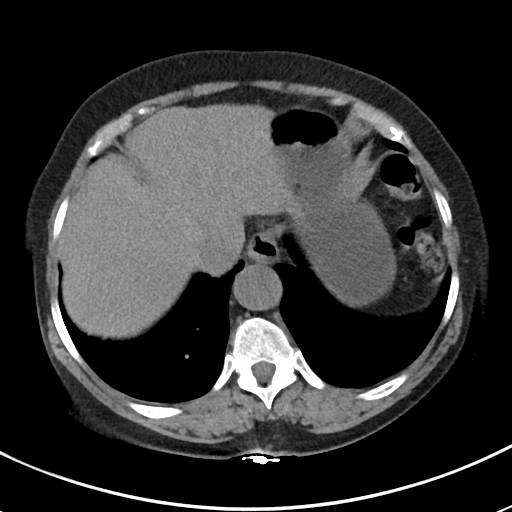
[im 75/86  lung]
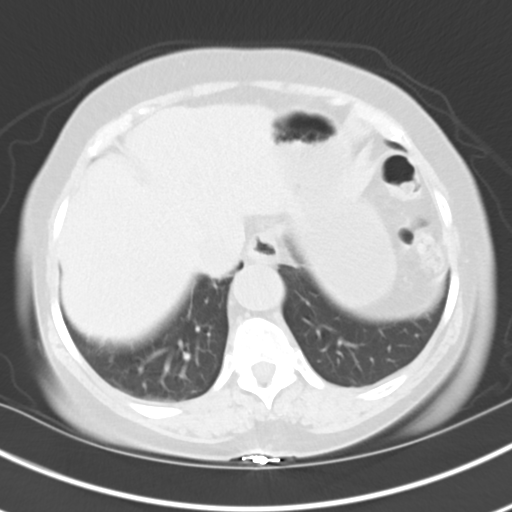

[Series 5: cor without without pre 2.00 cor · coronal · non-contrast · 0.64mm/px · 2 of 126 slices shown, 3 images]
[im 42/126  soft-tissue]
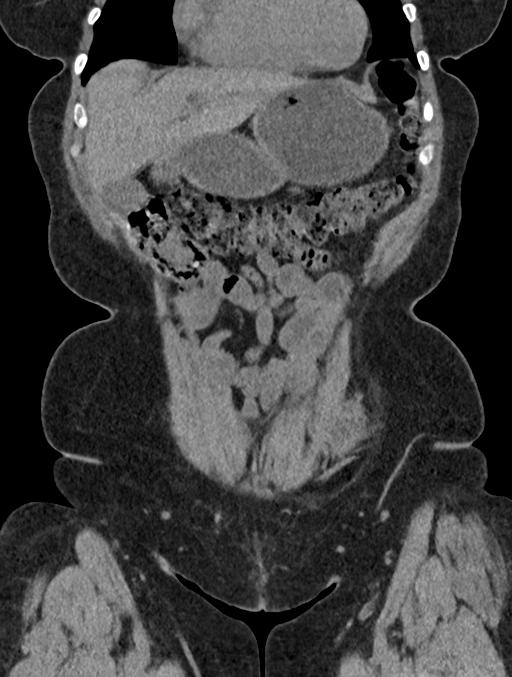
[im 42/126  bone]
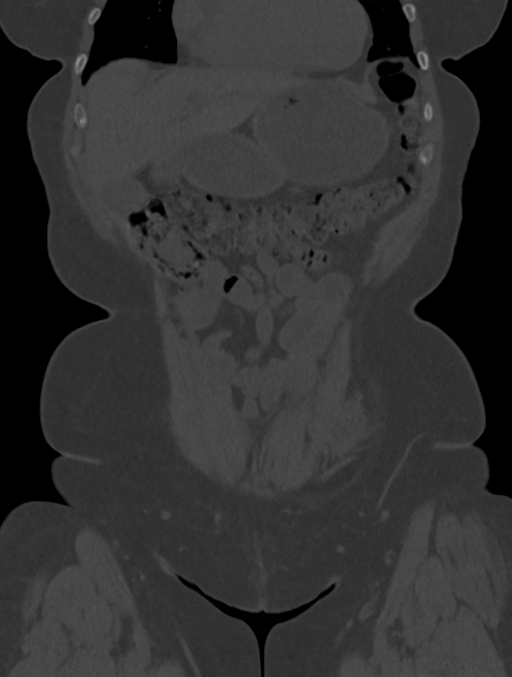
[im 84/126  soft-tissue]
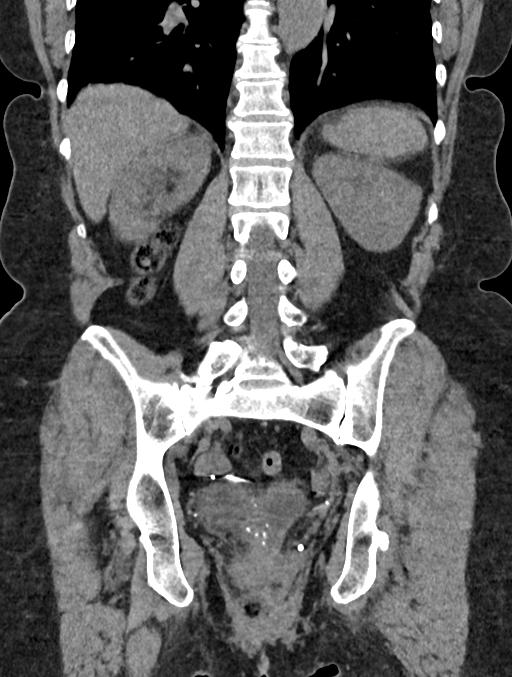

[Series 9: axial with hematuria with 5.00 · axial · 0.64mm/px · z∈[-1427,-1367]mm · 2 of 86 slices shown]
[im 13/86  soft-tissue]
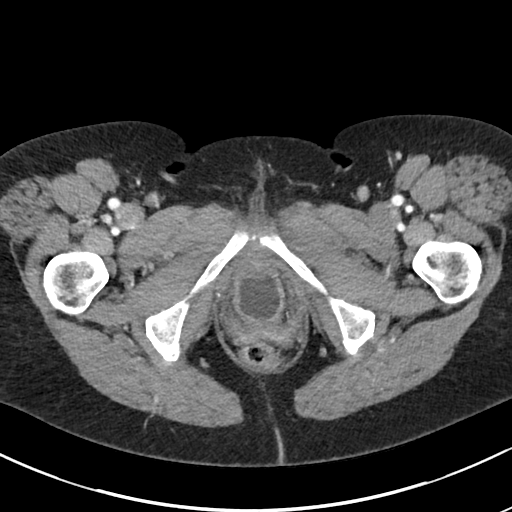
[im 25/86  soft-tissue]
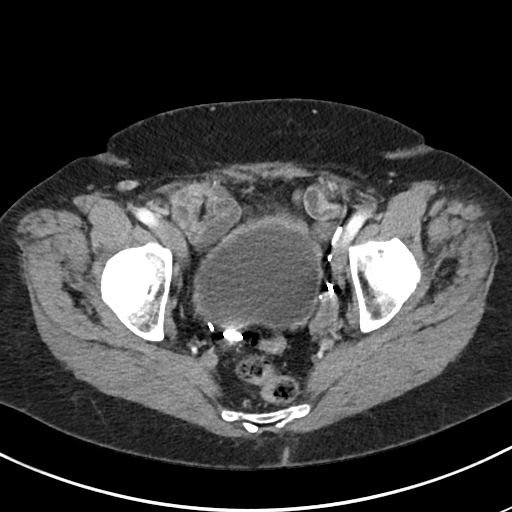

[11 of 46 positions shown; findings below may reference images not displayed]

FINDINGS: Lower chest: No acute abnormality.

Hepatobiliary: No solid liver abnormality is seen. No gallstones,
gallbladder wall thickening, or biliary dilatation.

Pancreas: Unremarkable. No pancreatic ductal dilatation or
surrounding inflammatory changes.

Spleen: Normal in size without significant abnormality.

Adrenals/Urinary Tract: Adrenal glands are unremarkable. Small
nonobstructive calculus of the inferior pole of the left kidney. No
right-sided calculi, ureteral calculi, or hydronephrosis. Simple
bilateral renal cysts. No urinary tract filling defect on delayed
phase imaging. Pelvic floor prolapse with a large cystocele (series
14, image 84). Mild thickening and hyperenhancement of the urinary
bladder wall (series 14, image 85).

Stomach/Bowel: Stomach is within normal limits. Appendix is not
clearly visualized and may be surgically absent no evidence of bowel
wall thickening, distention, or inflammatory changes.

Vascular/Lymphatic: No significant vascular findings are present. No
enlarged abdominal or pelvic lymph nodes.

Reproductive: Status post hysterectomy.

Other: No abdominal wall hernia or abnormality. No abdominopelvic
ascites.

Musculoskeletal: No acute or significant osseous findings.
IMPRESSION: 1. Small nonobstructive calculus of the inferior pole of the left
kidney. No right-sided calculi, ureteral calculi, or hydronephrosis.
2. No urinary tract filling defect on delayed phase imaging.
3. Pelvic floor prolapse with a large cystocele.
4. Mild thickening and hyperenhancement of the urinary bladder wall
, suggestive of nonspecific infectious or inflammatory cystitis.
Correlate with urinalysis.
5. Status post hysterectomy.

## 2021-06-14 MED ORDER — IOHEXOL 350 MG/ML SOLN
100.0000 mL | Freq: Once | INTRAVENOUS | Status: AC | PRN
  Administered 2021-06-14: 100 mL via INTRAVENOUS

## 2021-11-28 ENCOUNTER — Other Ambulatory Visit: Payer: Self-pay | Admitting: Internal Medicine

## 2021-11-28 DIAGNOSIS — Z1231 Encounter for screening mammogram for malignant neoplasm of breast: Secondary | ICD-10-CM

## 2021-12-14 ENCOUNTER — Other Ambulatory Visit: Payer: Self-pay | Admitting: Orthopedic Surgery

## 2021-12-14 ENCOUNTER — Other Ambulatory Visit (HOSPITAL_COMMUNITY): Payer: Self-pay | Admitting: Orthopedic Surgery

## 2021-12-14 DIAGNOSIS — M19019 Primary osteoarthritis, unspecified shoulder: Secondary | ICD-10-CM

## 2021-12-14 DIAGNOSIS — M75101 Unspecified rotator cuff tear or rupture of right shoulder, not specified as traumatic: Secondary | ICD-10-CM

## 2021-12-15 ENCOUNTER — Ambulatory Visit
Admission: RE | Admit: 2021-12-15 | Discharge: 2021-12-15 | Disposition: A | Discharge status: Home / Self Care | Payer: Medicare Other | Source: Ambulatory Visit | Attending: Orthopedic Surgery | Admitting: Orthopedic Surgery

## 2021-12-15 DIAGNOSIS — M19019 Primary osteoarthritis, unspecified shoulder: Secondary | ICD-10-CM | POA: Diagnosis present

## 2021-12-15 DIAGNOSIS — M75101 Unspecified rotator cuff tear or rupture of right shoulder, not specified as traumatic: Secondary | ICD-10-CM

## 2021-12-15 IMAGING — MR MR SHOULDER*R* W/O CM
4 of 5 series · 31 of 40 positions shown · non-contrast
Comparison: None.

CLINICAL DATA: Right shoulder pain and limited range of motion for
2 months

EXAM:
MRI OF THE RIGHT SHOULDER WITHOUT CONTRAST
TECHNIQUE: Multiplanar, multisequence MR imaging of the shoulder was performed.
No intravenous contrast was administered.

[Series 5: T2 fat-sat · axial · right · 4.0mm · 0.44mm/px · z∈[+14,+124]mm · 8 of 26 slices shown (1 of 3)]
[im 1/26]
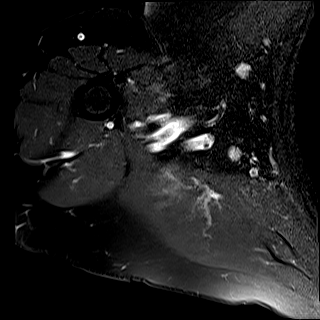
[im 4/26]
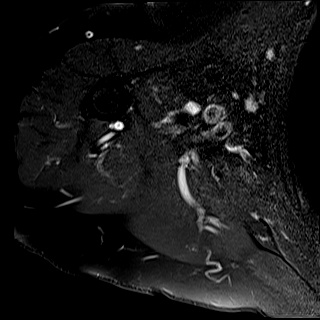
[im 8/26]
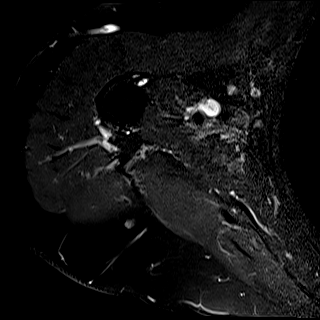
[im 11/26]
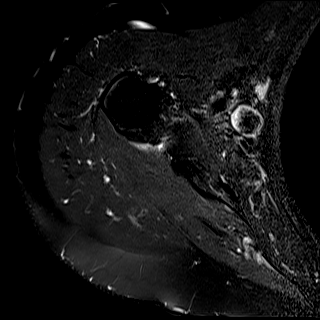
[im 15/26]
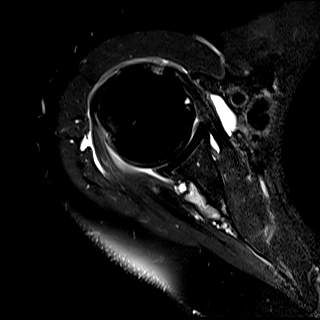
[im 18/26]
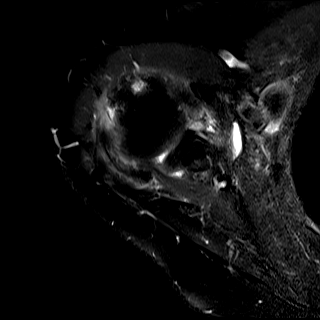
[im 22/26]
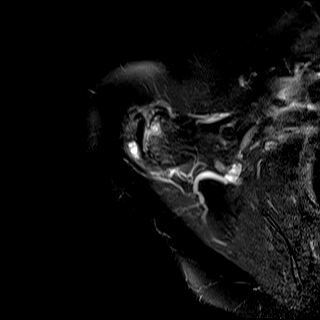
[im 26/26]
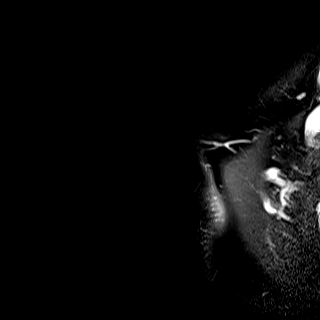

[Series 6: PD · oblique · right · 4.0mm · 0.44mm/px · 9 of 26 slices shown]
[im 1/26]
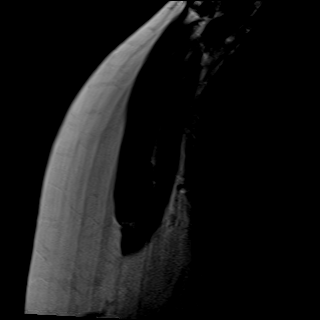
[im 4/26]
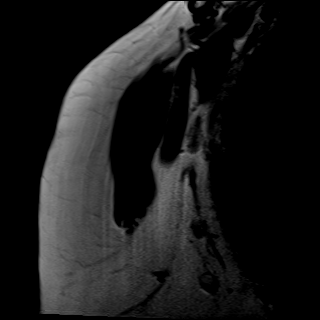
[im 7/26]
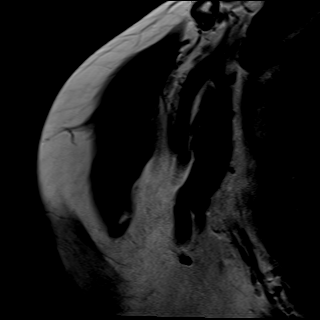
[im 10/26]
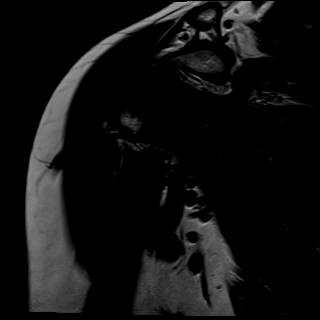
[im 13/26]
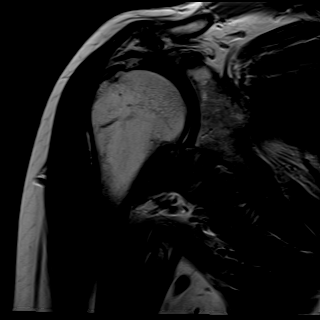
[im 16/26]
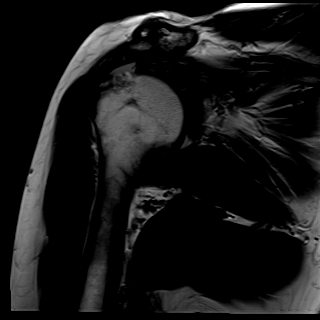
[im 19/26]
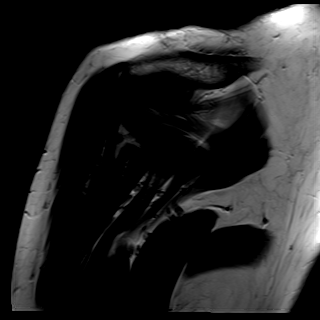
[im 22/26]
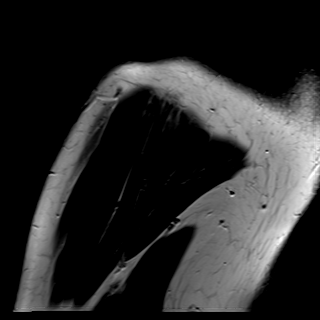
[im 26/26]
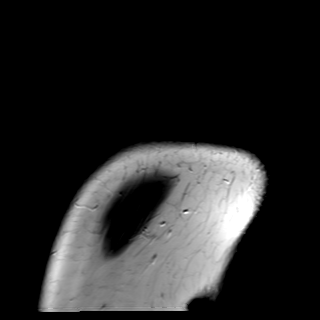

[Series 7: T2 fat-sat · oblique · right · 4.0mm · 0.44mm/px · 9 of 26 slices shown (2 of 3)]
[im 1/26]
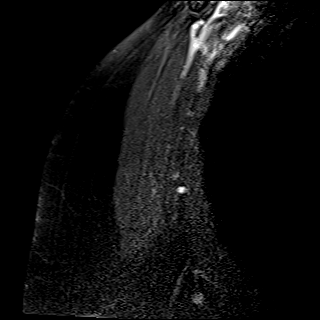
[im 4/26]
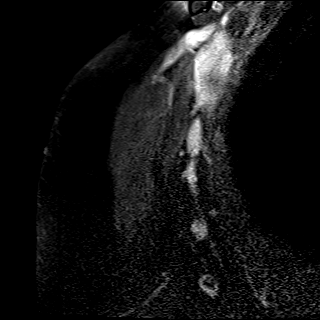
[im 7/26]
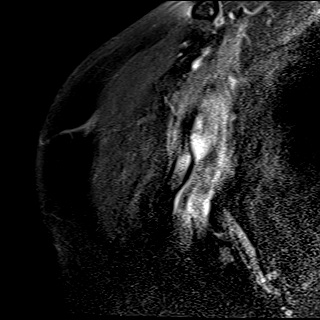
[im 10/26]
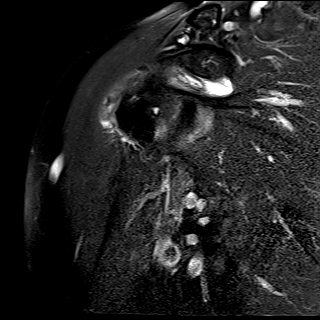
[im 13/26]
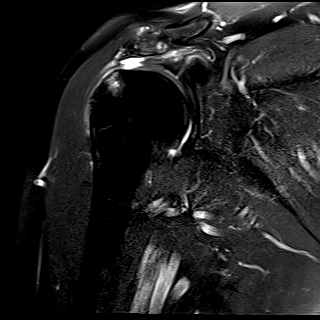
[im 16/26]
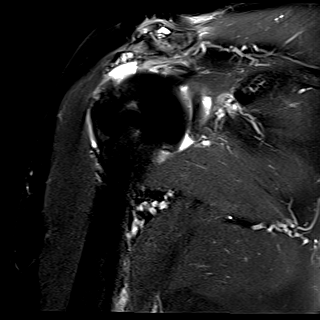
[im 19/26]
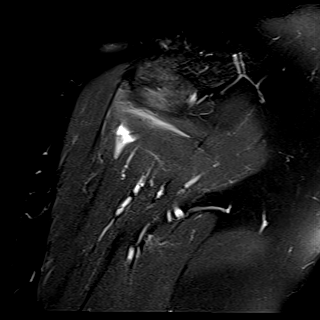
[im 22/26]
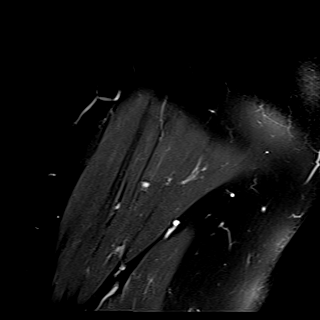
[im 26/26]
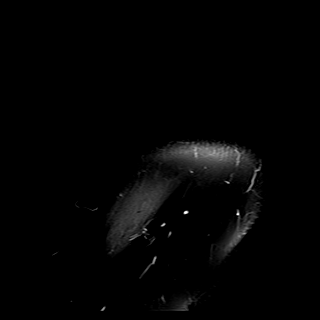

[Series 8: T2 fat-sat · oblique · right · 4.0mm · 0.22mm/px · 5 of 22 slices shown (3 of 3)]
[im 1/22]
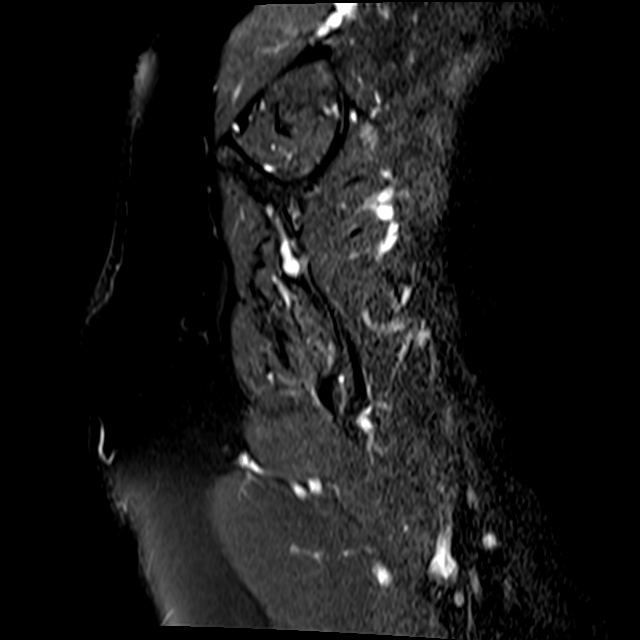
[im 4/22]
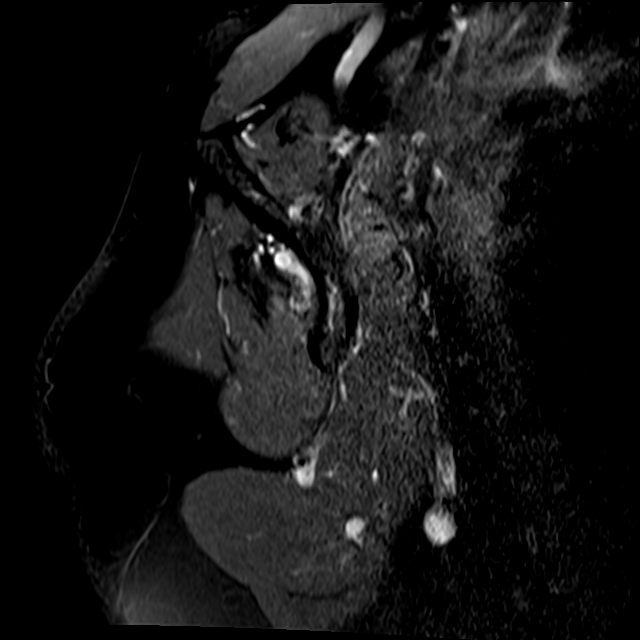
[im 8/22]
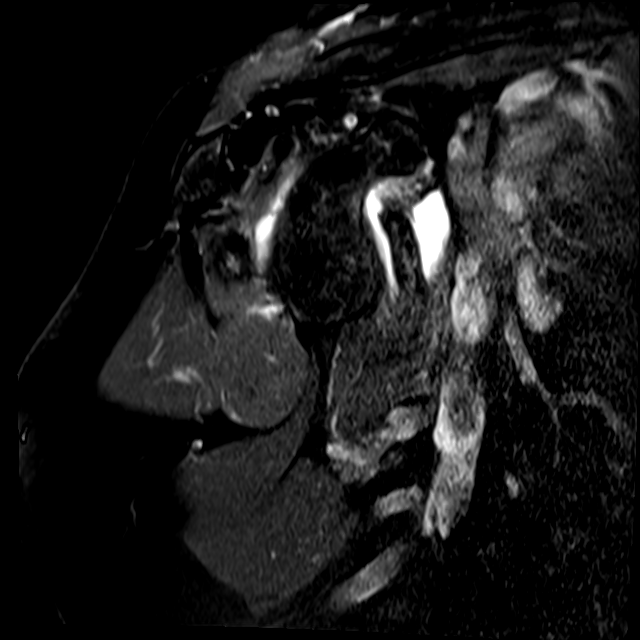
[im 11/22]
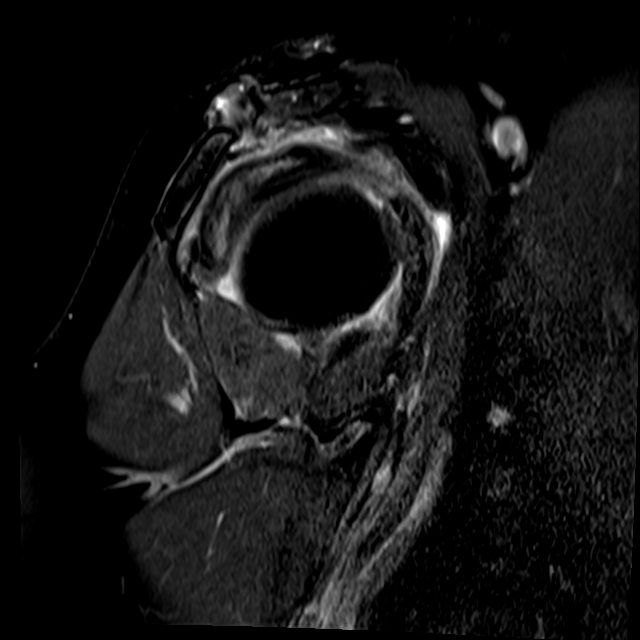
[im 18/22]
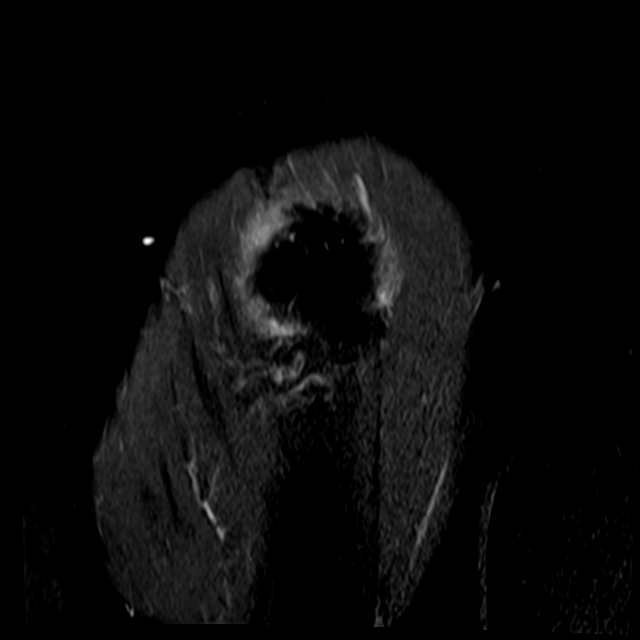

[31 of 40 positions shown; findings below may reference images not displayed]

FINDINGS: Rotator cuff: Full-thickness, nearly full width tear of the
supraspinatus tendon, with only small anterior portion of the
supraspinatus tendon still thought to attach to the humerus. The
rest of the tendon is retracted about 2.3 cm.

Moderate to prominent infraspinatus tendinopathy potentially with
some full-thickness partial width tearing of the superior
infraspinatus.

Mild subscapularis tendinopathy.

Muscles: Moderate supraspinatus and infraspinatus muscular atrophy
for example on image 21 series 9.

Biceps long head: Severe ill definition of the intra-articular
segment of long head of the biceps suspicious for rupture or less
likely high-grade partial tearing.

Acromioclavicular Joint: Prominent spurring and subcortical marrow
edema along with subcortical cyst formation along the AC joint along
with fluid signal along the superficial margin of the joint. Type II
acromion. As expected there is some fluid in the subacromial
subdeltoid bursa.

Glenohumeral Joint: Normal amount of fluid in the glenohumeral
joint. Mild spurring of the humeral head and inferior glenoid mild
degenerative chondral thinning along the humeral head.

Labrum:  Grossly unremarkable

Bones: Mild degenerative subcortical marrow edema along the greater
tuberosity on image 14 series 7.

Other: No supplemental non-categorized findings.
IMPRESSION: 1. Full-thickness, nearly full width tear of the supraspinatus
tendon, with most of the tendon retracted about 2.3 cm. There is
likely a small amount of remaining attachment of the supraspinatus
anteriorly to the humeral head.
2. Moderate to prominent infraspinatus tendinopathy potentially with
mild full-thickness partial width tearing of the superior portion.
Mild subscapularis tendinopathy.
3. Moderate infraspinatus and mild supraspinatus muscular atrophy.
No paralabral cysts or other specific impinging lesion.
4. Indistinct proximal long head of the biceps suspicious for
rupture or less likely high-grade partial tearing.
5. Prominent degenerative AC joint arthropathy. Mild degenerative
glenohumeral arthropathy.

## 2022-01-05 ENCOUNTER — Ambulatory Visit
Admission: RE | Admit: 2022-01-05 | Discharge: 2022-01-05 | Disposition: A | Discharge status: Home / Self Care | Payer: Medicare Other | Source: Ambulatory Visit | Attending: Internal Medicine | Admitting: Internal Medicine

## 2022-01-05 DIAGNOSIS — Z1231 Encounter for screening mammogram for malignant neoplasm of breast: Secondary | ICD-10-CM | POA: Diagnosis present

## 2022-01-05 IMAGING — MG MM DIGITAL SCREENING BILAT W/ TOMO AND CAD
8 series · 8 of 24 positions shown · non-contrast
Comparison: Previous exam(s).

CLINICAL DATA: Screening.

EXAM:
DIGITAL SCREENING BILATERAL MAMMOGRAM WITH TOMOSYNTHESIS AND CAD
TECHNIQUE: Bilateral screening digital craniocaudal and mediolateral oblique
mammograms were obtained. Bilateral screening digital breast
tomosynthesis was performed. The images were evaluated with
computer-aided detection.

[R MLO synth-2D]
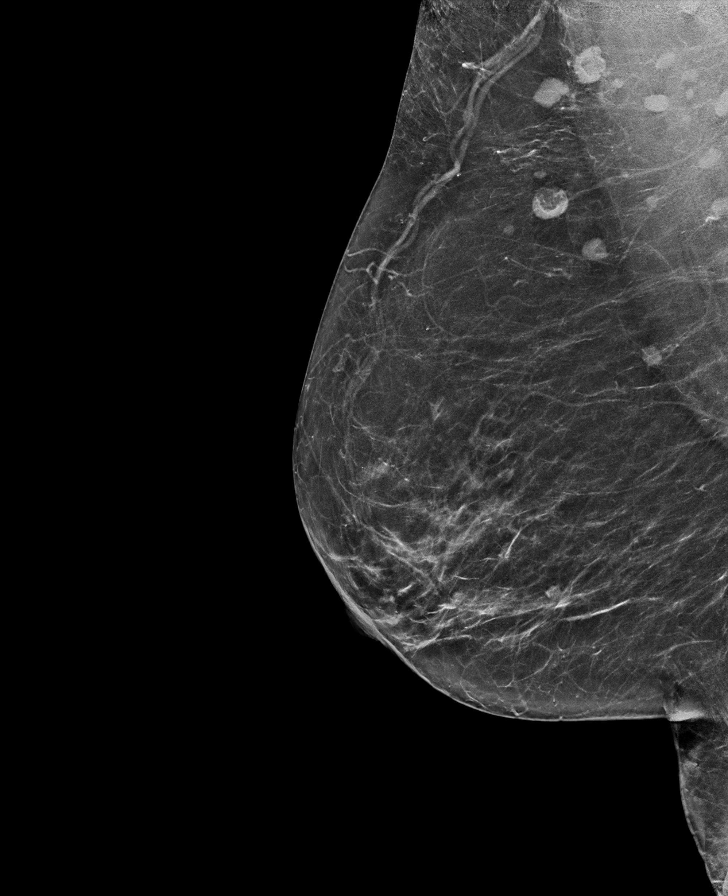

[L CC synth-2D]
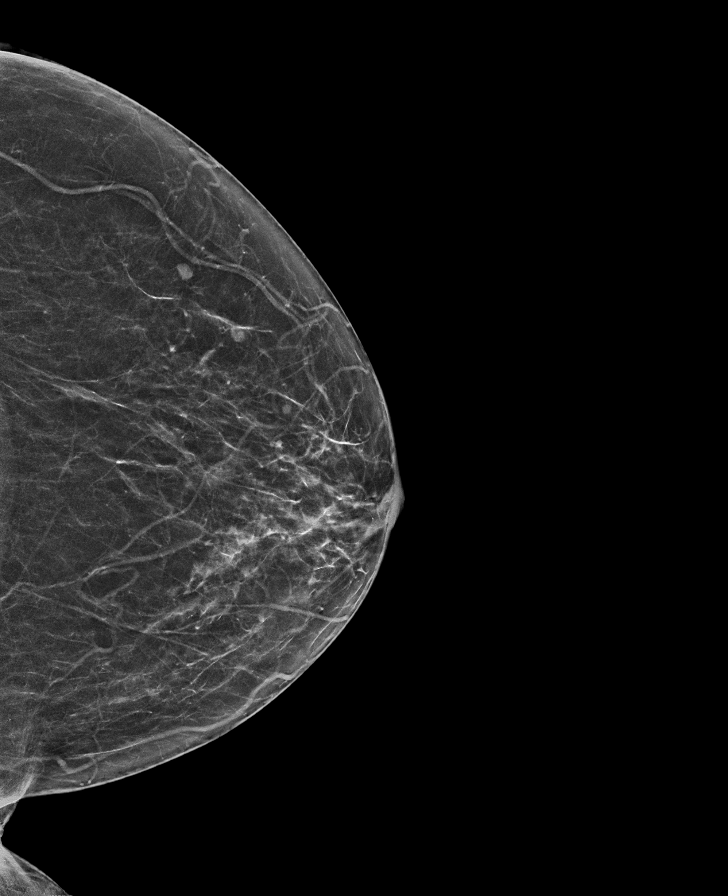

[R CC synth-2D]
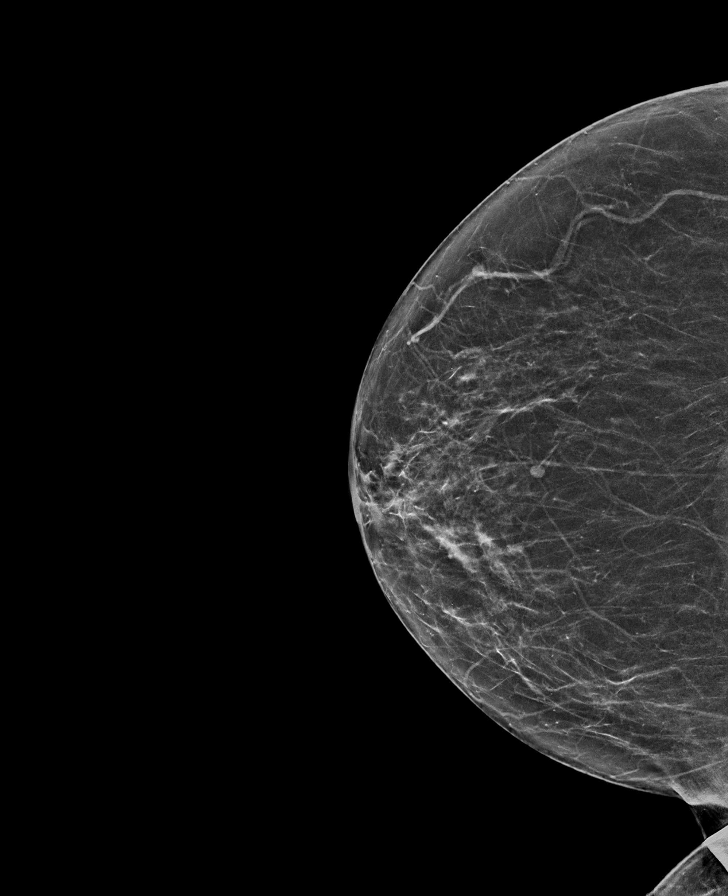

[L MLO synth-2D]
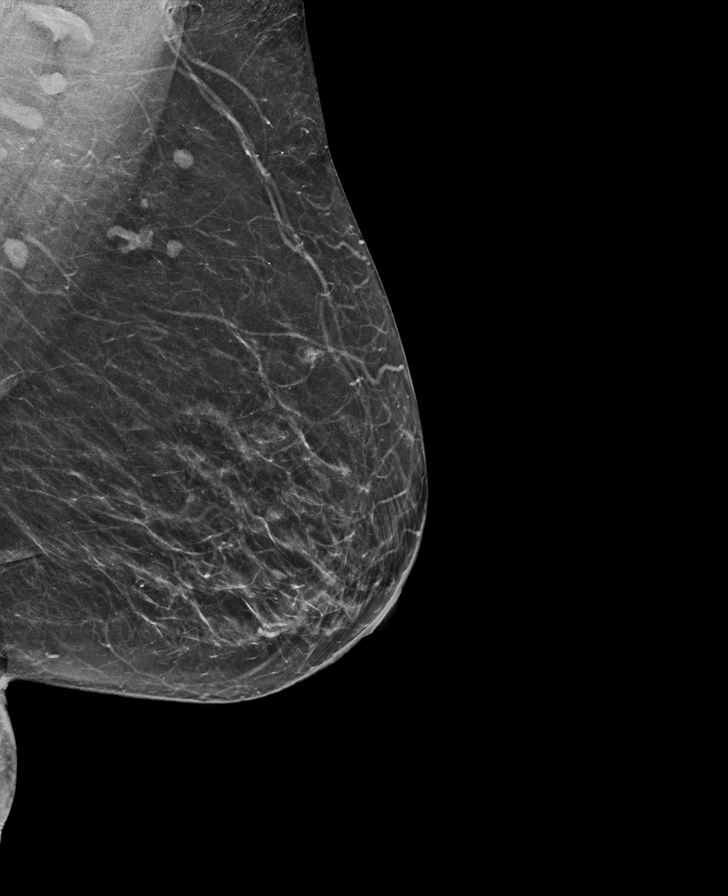

[R CC tomo · tomo slice 33/66.0]
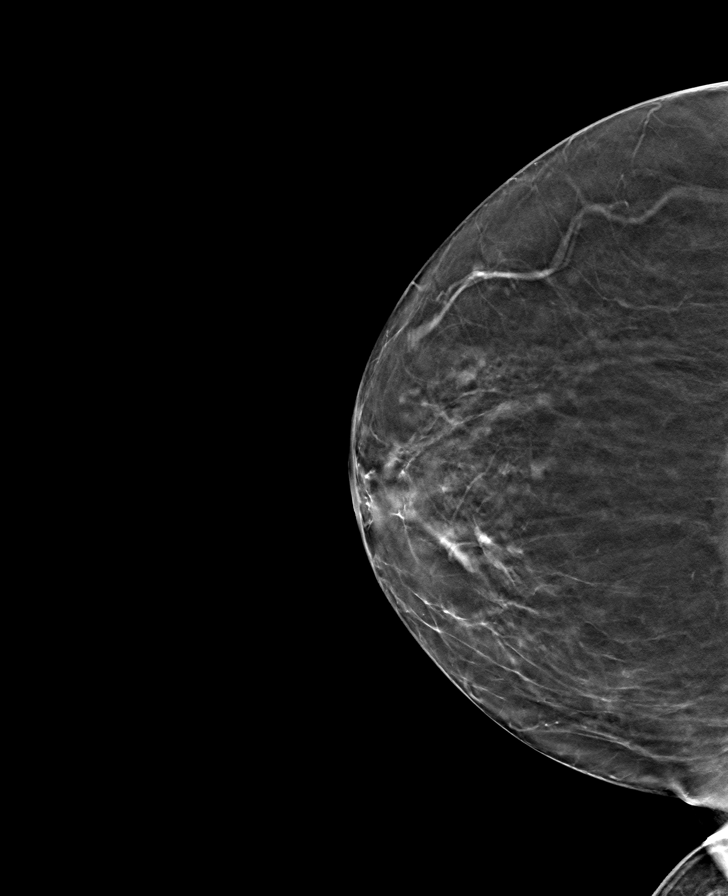

[R MLO tomo · tomo slice 39/76.0]
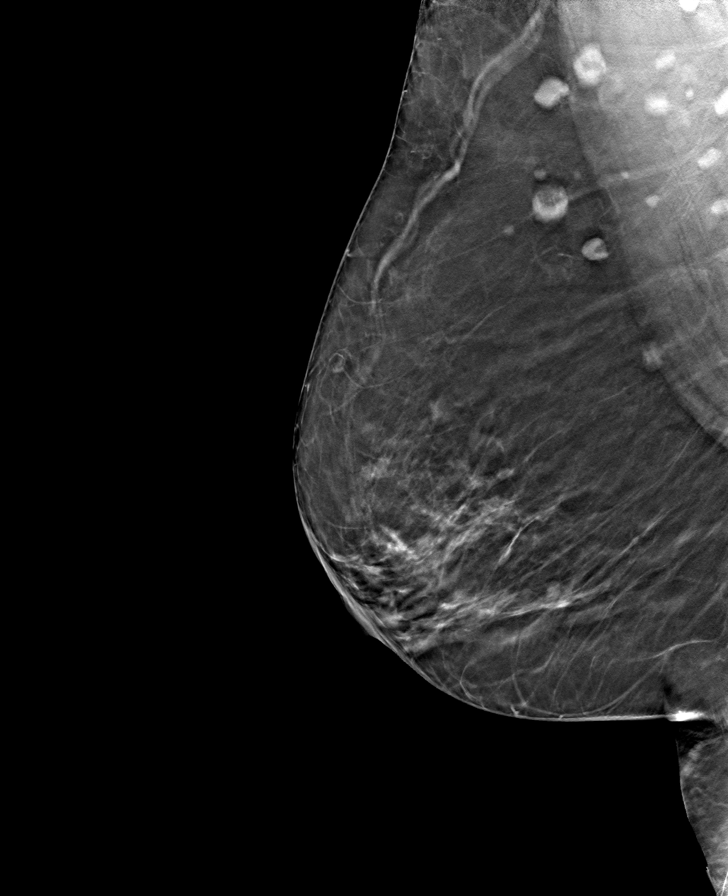

[L CC tomo · tomo slice 31/61.0]
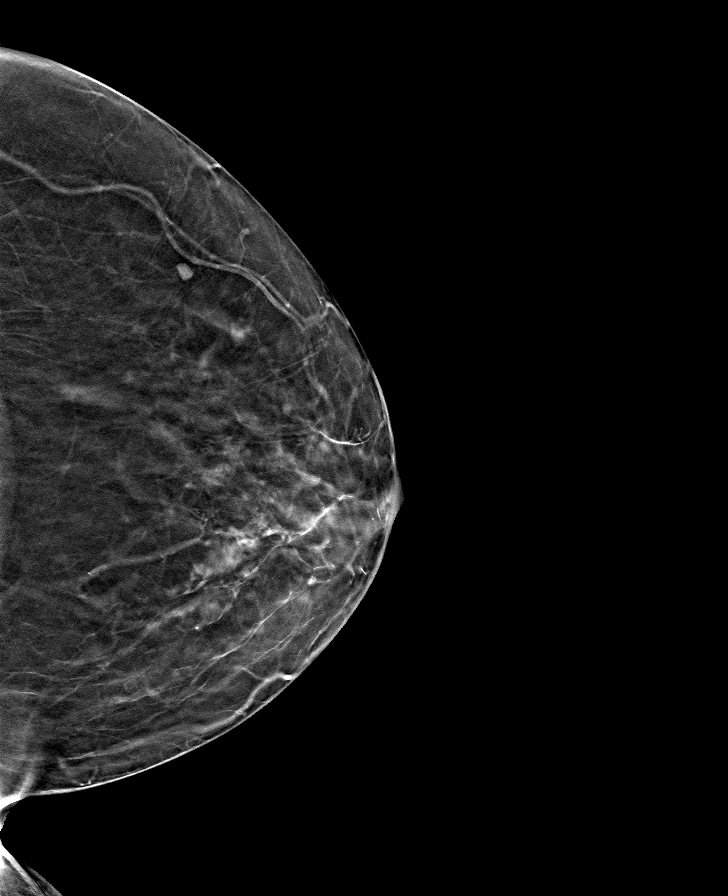

[L MLO tomo · tomo slice 36/71.0]
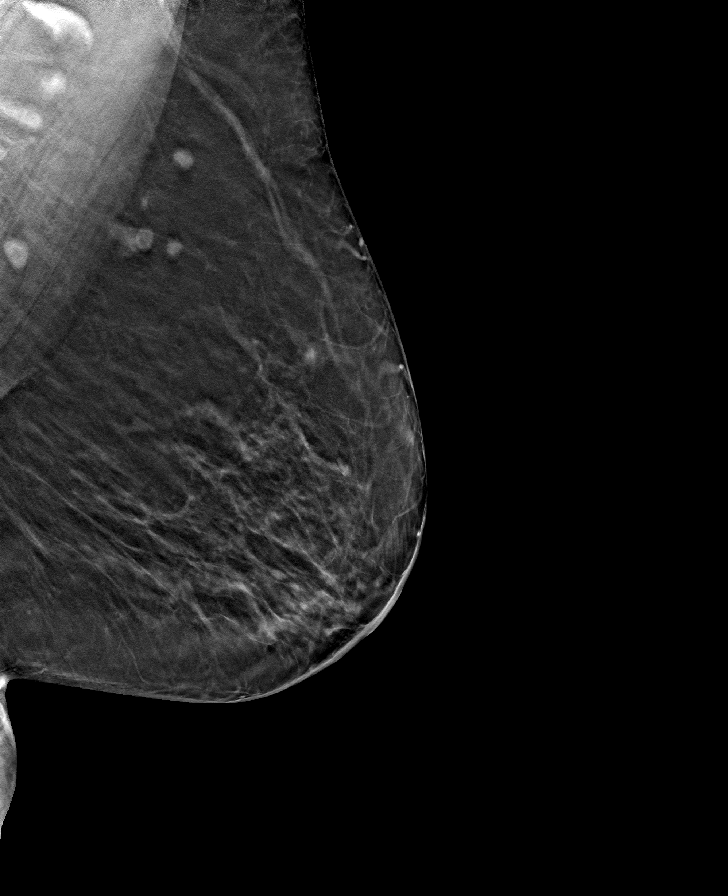

[8 of 24 positions shown; findings below may reference images not displayed]

ACR Breast Density Category b: There are scattered areas of
fibroglandular density.
FINDINGS: There are no findings suspicious for malignancy.
IMPRESSION: No mammographic evidence of malignancy. A result letter of this
screening mammogram will be mailed directly to the patient.

RECOMMENDATION:
Screening mammogram in one year. (Code:[BY])

BI-RADS CATEGORY  1: Negative.

## 2022-01-24 ENCOUNTER — Other Ambulatory Visit: Payer: Self-pay | Admitting: Surgery

## 2022-02-01 ENCOUNTER — Encounter
Admission: RE | Admit: 2022-02-01 | Discharge: 2022-02-01 | Disposition: A | Discharge status: Home / Self Care | Payer: Medicare Other | Source: Ambulatory Visit | Attending: Surgery | Admitting: Surgery

## 2022-02-01 VITALS — BP 134/91 | HR 86 | Temp 97.7°F | Resp 18 | Ht 62.0 in | Wt 154.0 lb

## 2022-02-01 DIAGNOSIS — Z01818 Encounter for other preprocedural examination: Secondary | ICD-10-CM | POA: Diagnosis present

## 2022-02-01 DIAGNOSIS — R8290 Unspecified abnormal findings in urine: Secondary | ICD-10-CM | POA: Insufficient documentation

## 2022-02-01 DIAGNOSIS — Z01812 Encounter for preprocedural laboratory examination: Secondary | ICD-10-CM

## 2022-02-01 DIAGNOSIS — Z01810 Encounter for preprocedural cardiovascular examination: Secondary | ICD-10-CM | POA: Diagnosis not present

## 2022-02-01 HISTORY — DX: Personal history of urinary calculi: Z87.442

## 2022-02-01 LAB — CBC WITH DIFFERENTIAL/PLATELET
Abs Immature Granulocytes: 0.01 10*3/uL (ref 0.00–0.07)
Basophils Absolute: 0 10*3/uL (ref 0.0–0.1)
Basophils Relative: 1 %
Eosinophils Absolute: 0.2 10*3/uL (ref 0.0–0.5)
Eosinophils Relative: 3 %
HCT: 35.9 % — ABNORMAL LOW (ref 36.0–46.0)
Hemoglobin: 11.3 g/dL — ABNORMAL LOW (ref 12.0–15.0)
Immature Granulocytes: 0 %
Lymphocytes Relative: 44 %
Lymphs Abs: 2.3 10*3/uL (ref 0.7–4.0)
MCH: 26.8 pg (ref 26.0–34.0)
MCHC: 31.5 g/dL (ref 30.0–36.0)
MCV: 85.3 fL (ref 80.0–100.0)
Monocytes Absolute: 0.3 10*3/uL (ref 0.1–1.0)
Monocytes Relative: 6 %
Neutro Abs: 2.4 10*3/uL (ref 1.7–7.7)
Neutrophils Relative %: 46 %
Platelets: 293 10*3/uL (ref 150–400)
RBC: 4.21 MIL/uL (ref 3.87–5.11)
RDW: 13.7 % (ref 11.5–15.5)
WBC: 5.2 10*3/uL (ref 4.0–10.5)
nRBC: 0 % (ref 0.0–0.2)

## 2022-02-01 LAB — COMPREHENSIVE METABOLIC PANEL
ALT: 14 U/L (ref 0–44)
AST: 17 U/L (ref 15–41)
Albumin: 3.5 g/dL (ref 3.5–5.0)
Alkaline Phosphatase: 81 U/L (ref 38–126)
Anion gap: 3 — ABNORMAL LOW (ref 5–15)
BUN: 17 mg/dL (ref 8–23)
CO2: 25 mmol/L (ref 22–32)
Calcium: 8.8 mg/dL — ABNORMAL LOW (ref 8.9–10.3)
Chloride: 107 mmol/L (ref 98–111)
Creatinine, Ser: 0.78 mg/dL (ref 0.44–1.00)
GFR, Estimated: >60 mL/min (ref >60)
Glucose, Bld: 126 mg/dL — ABNORMAL HIGH (ref 70–99)
Potassium: 3.6 mmol/L (ref 3.5–5.1)
Sodium: 135 mmol/L (ref 135–145)
Total Bilirubin: 0.4 mg/dL (ref 0.3–1.2)
Total Protein: 6.7 g/dL (ref 6.5–8.1)

## 2022-02-01 LAB — TYPE AND SCREEN
ABO/RH(D): B POS
Antibody Screen: NEGATIVE

## 2022-02-01 LAB — URINALYSIS, ROUTINE W REFLEX MICROSCOPIC
Bilirubin Urine: NEGATIVE
Glucose, UA: NEGATIVE mg/dL
Ketones, ur: NEGATIVE mg/dL
Nitrite: NEGATIVE
Protein, ur: 30 mg/dL — AB
Specific Gravity, Urine: 1.019 (ref 1.005–1.030)
WBC, UA: >50 WBC/hpf — ABNORMAL HIGH (ref 0–5)
pH: 8 (ref 5.0–8.0)

## 2022-02-01 LAB — SURGICAL PCR SCREEN
MRSA, PCR: NEGATIVE
Staphylococcus aureus: NEGATIVE

## 2022-02-01 NOTE — Patient Instructions (Addendum)
?Your procedure is scheduled on: Thursday Feb 15, 2022. ?Report to Day Surgery inside [facility] 2nd floor, stop by admissions desk before getting on elevator.  ?To find out your arrival time please call [phone] between 1PM - 3PM on Wednesday Feb 14, 2022. ? ?Remember: Instructions that are not followed completely may result in serious medical risk,  ?up to and including death, or upon the discretion of your surgeon and anesthesiologist your  ?surgery may need to be rescheduled.  ? ?  _X__ 1. Do not eat food after midnight the night before your procedure. ?                No chewing gum or hard candies. You may drink clear liquids up to 2 hours ?                before you are scheduled to arrive for your surgery- DO not drink clear ?                liquids within 2 hours of the start of your surgery. ?                Clear Liquids include:  water, apple juice without pulp, clear Gatorade, G2 or  ?                Gatorade Zero (avoid Red/Purple/Blue), Black Coffee or Tea (Do not add ?                anything to coffee or tea). ? ?__X__2.   Complete the "Ensure Clear Pre-surgery Clear Carbohydrate Drink" provided to you, 2 hours before arrival. **If you are diabetic you will be provided with an alternative drink, Gatorade Zero or G2. ? ?__X__3.  On the morning of surgery brush your teeth with toothpaste and water, you ?               may rinse your mouth with mouthwash if you wish.  Do not swallow any toothpaste of mouthwash. ?   ? _X__ 4.  No Alcohol for 24 hours before or after surgery. ? ? _X__ 5.  Do Not Smoke or use e-cigarettes For 24 Hours Prior to Your Surgery. ?                Do not use any chewable tobacco products for at least 6 hours prior to ?                Surgery. ? ?_X__  6.  Do not use any recreational drugs (marijuana, cocaine, heroin, ecstasy, MDMA or other) ?               For at least one week prior to your surgery.  Combination of these drugs with anesthesia ?                May have life threatening results. ? ?____  7.  Bring all medications with you on the day of surgery if instructed.  ? ?__X__8.  Notify your doctor if there is any change in your medical condition  ?    (cold, fever, infections). ?    ?Do not wear jewelry, make-up, hairpins, clips or nail polish. ?Do not wear lotions, powders, or perfumes. You may wear deodorant. ?Do not shave 48 hours prior to surgery. Men may shave face and neck. ?Do not bring valuables to the hospital.   ? ?[facility] is not responsible for any belongings or valuables. ? ?Contacts,  dentures or bridgework may not be worn into surgery. ?Leave your suitcase in the car. After surgery it may be brought to your room. ?For patients admitted to the hospital, discharge time is determined by your ?treatment team. ?  ?Patients discharged the day of surgery will not be allowed to drive home.   ?Make arrangements for someone to be with you for the first 24 hours of your ?Same Day Discharge. ? ? ?__X__ Take these medicines the morning of surgery with A SIP OF WATER:  ? ? 1. None  ? 2.  ? 3.  ? 4. ? 5. ? 6. ? ?____ Fleet Enema (as directed)  ? ?__X__ Use CHG Soap (or wipes) as directed ? ?____ Use Benzoyl Peroxide Gel as instructed ? ?____ Use inhalers on the day of surgery ? ?____ Stop metformin 2 days prior to surgery   ? ?____ Take 1/2 of usual insulin dose the night before surgery. No insulin the morning ?         of surgery.  ? ?__X__ One week prior to surgery stop aspirin 81 mg.   ? ?__X__ One Week prior to surgery- Stop Anti-inflammatories such as Ibuprofen, Aleve, Advil, Motrin, meloxicam (MOBIC), diclofenac, etodolac, ketorolac, Toradol, Daypro, piroxicam, Goody's or BC powders. OK TO USE TYLENOL IF NEEDED ?  ?__X__ Do not start any new vitamins and or herbal supplements until after surgery.   ? ?____ Bring C-Pap to the hospital.  ? ? ?If you have any questions regarding your pre-procedure instructions,  ?Please call Pre-admit Testing at  [phone] ?

## 2022-02-04 ENCOUNTER — Encounter: Payer: Self-pay | Admitting: Surgery

## 2022-02-04 LAB — URINE CULTURE: Culture: >=100000 — AB

## 2022-02-05 ENCOUNTER — Telehealth: Payer: Self-pay | Admitting: Urgent Care

## 2022-02-05 DIAGNOSIS — B9620 Unspecified Escherichia coli [E. coli] as the cause of diseases classified elsewhere: Secondary | ICD-10-CM

## 2022-02-05 DIAGNOSIS — Z01812 Encounter for preprocedural laboratory examination: Secondary | ICD-10-CM

## 2022-02-05 MED ORDER — CEPHALEXIN 500 MG PO CAPS: 500.0000 mg | ORAL_CAPSULE | Freq: Two times a day (BID) | ORAL | 0 refills | Status: AC

## 2022-02-05 NOTE — Progress Notes (Signed)
?[facility] ?Perioperative Services: Pre-Admission/Anesthesia Testing ? ?Abnormal Lab Notification and Treatment Plan of Care ?  ?Date: 02/05/22 ? ?Name: Jennifer Rubio ?MRN:   [id number] ? ?Re: Abnormal labs noted during PAT appointment  ? ?Notified:  ?Provider Name Provider Role Notification Mode  ?Milagros Evener, MD Orthopedics (Surgeon) Routed and/or faxed via [facility]  ? ?Abnormal Lab Value(s):  ? ?Lab Results  ?Component Value Date  ? COLORURINE YELLOW (A) 02/01/2022  ? APPEARANCEUR TURBID (A) 02/01/2022  ? LABSPEC 1.019 02/01/2022  ? PHURINE 8.0 02/01/2022  ? GLUCOSEU NEGATIVE 02/01/2022  ? HGBUR SMALL (A) 02/01/2022  ? [place] NEGATIVE 02/01/2022  ? [place] NEGATIVE 02/01/2022  ? PROTEINUR 30 (A) 02/01/2022  ? UROBILINOGEN 0.2 10/20/2011  ? NITRITE NEGATIVE 02/01/2022  ? LEUKOCYTESUR SMALL (A) 02/01/2022  ? EPIU 6-10 02/01/2022  ? WBCU >50 (H) 02/01/2022  ? RBCU 6-10 02/01/2022  ? BACTERIA MANY (A) 02/01/2022  ? CULT >=100,000 COLONIES/mL ESCHERICHIA COLI (A) 02/01/2022  ? ? ?Clinical Information and Notes:  ?Patient is scheduled for an elective RIGHT REVERSE SHOULDER ARTHROPLASTY WITH POSSIBLE BICEPS TENODESIS on 02/15/2022.   ? ?UA performed in PAT consistent with infection.  ?No leukocytosis noted on CBC; WBC 5200 ?Renal function: Estimated Creatinine Clearance: 60.8 mL/min (by C-G formula based on SCr of 0.78 mg/dL). ?Urine C&S added to assess for pathogenically significant growth. ? ?Impression and Plan:  ?Jennifer Rubio with a UA that was (+) for infection. Reflex culture sent that grew out significant Escherichia coli colony count.  Contacted patient to discuss. Patient reporting that she is experiencing dysuria, urinary frequency/urgency, and malodorous urine.  Patient denied any abdominal pain, back pain, fever, chills, nausea, vomiting.  Patient with surgery scheduled soon. In efforts to avoid delaying patient's procedure, or have her experience any potentially significant  perioperative complications related to the aforementioned, I would like to proceed with treatment for urinary tract infection. ? ?Allergies reviewed. Culture report also reviewed to ensure culture appropriate coverage is being provided. Will treat with a 7 day course of CEPHALEXIN. Patient encouraged to complete the entire course of antibiotics even if she begins to feel better.  ? ?Meds ordered this encounter  ?Medications  ? cephALEXin (KEFLEX) 500 MG capsule  ?  Sig: Take 1 capsule (500 mg total) by mouth 2 (two) times daily for 7 days. Increase WATER intake while taking this medication.  ?  Dispense:  14 capsule  ?  Refill:  0  ? ?Patient encouraged to increase her fluid intake as much as possible. Discussed that water is always best to flush the urinary tract. She was advised to avoid caffeine containing fluids until her infections clears, as caffeine can cause her to experience painful bladder spasms.  ? ?May use Tylenol as needed for pain/fever should she experience these symptoms.  ? ?Patient instructed to call surgeon's office or PAT with any questions or concerns related to the above outlined course of treatment. Additionally, she was instructed to call if she feels like she is getting worse overall while on treatment. Results and treatment plan of care forwarded to primary attending surgeon to make them aware.  ? ?Encounter Diagnoses  ?Name Primary?  ? Pre-operative laboratory examination Yes  ? E. coli UTI (urinary tract infection)   ? ?Honor Loh, MSN, APRN, FNP-C, CEN ?[place]  ?Peri-operative Services Nurse Practitioner ?Phone: [phone] ?Fax: [phone] ?02/05/22 8:31 AM ? ?NOTE: This note has been prepared using [employer]. Despite my  best ability to proofread, there is always the potential that unintentional transcriptional errors may still occur from this process. ? ?

## 2022-02-14 MED ORDER — ORAL CARE MOUTH RINSE: 15.0000 mL | Freq: Once | OROMUCOSAL | Status: DC

## 2022-02-14 MED ORDER — LACTATED RINGERS IV SOLN: INTRAVENOUS | Status: DC

## 2022-02-14 MED ORDER — FAMOTIDINE 20 MG PO TABS: 20.0000 mg | ORAL_TABLET | Freq: Once | ORAL | Status: AC

## 2022-02-14 MED ORDER — CHLORHEXIDINE GLUCONATE 0.12 % MT SOLN: 15.0000 mL | Freq: Once | OROMUCOSAL | Status: DC

## 2022-02-14 MED ORDER — CEFAZOLIN SODIUM-DEXTROSE 2-4 GM/100ML-% IV SOLN
2.0000 g | INTRAVENOUS | Status: AC
  Administered 2022-02-15: 2 g via INTRAVENOUS

## 2022-02-15 ENCOUNTER — Ambulatory Visit: Payer: Medicare Other | Admitting: Urgent Care

## 2022-02-15 ENCOUNTER — Other Ambulatory Visit: Payer: Self-pay

## 2022-02-15 ENCOUNTER — Observation Stay
Admission: RE | Admit: 2022-02-15 | Discharge: 2022-02-16 | Disposition: A | Discharge status: Home / Self Care | Payer: Medicare Other | Source: Ambulatory Visit | Attending: Surgery | Admitting: Surgery

## 2022-02-15 ENCOUNTER — Ambulatory Visit: Payer: Medicare Other

## 2022-02-15 ENCOUNTER — Encounter: Payer: Self-pay | Admitting: Surgery

## 2022-02-15 ENCOUNTER — Encounter
Admission: RE | Disposition: A | Discharge status: Home / Self Care | Payer: Self-pay | Source: Ambulatory Visit | Attending: Surgery

## 2022-02-15 DIAGNOSIS — Z96611 Presence of right artificial shoulder joint: Secondary | ICD-10-CM

## 2022-02-15 DIAGNOSIS — M75121 Complete rotator cuff tear or rupture of right shoulder, not specified as traumatic: Principal | ICD-10-CM | POA: Insufficient documentation

## 2022-02-15 DIAGNOSIS — M12811 Other specific arthropathies, not elsewhere classified, right shoulder: Secondary | ICD-10-CM | POA: Insufficient documentation

## 2022-02-15 DIAGNOSIS — I10 Essential (primary) hypertension: Secondary | ICD-10-CM | POA: Insufficient documentation

## 2022-02-15 DIAGNOSIS — Z8616 Personal history of COVID-19: Secondary | ICD-10-CM | POA: Insufficient documentation

## 2022-02-15 DIAGNOSIS — Z79899 Other long term (current) drug therapy: Secondary | ICD-10-CM | POA: Diagnosis not present

## 2022-02-15 DIAGNOSIS — M75101 Unspecified rotator cuff tear or rupture of right shoulder, not specified as traumatic: Secondary | ICD-10-CM | POA: Diagnosis present

## 2022-02-15 DIAGNOSIS — Z96651 Presence of right artificial knee joint: Secondary | ICD-10-CM | POA: Diagnosis not present

## 2022-02-15 DIAGNOSIS — X58XXXA Exposure to other specified factors, initial encounter: Secondary | ICD-10-CM | POA: Insufficient documentation

## 2022-02-15 DIAGNOSIS — S46211A Strain of muscle, fascia and tendon of other parts of biceps, right arm, initial encounter: Secondary | ICD-10-CM | POA: Insufficient documentation

## 2022-02-15 HISTORY — PX: REVERSE SHOULDER ARTHROPLASTY: SHX5054

## 2022-02-15 IMAGING — DX DG SHOULDER 1V*R*
2 series · 3 of 3 positions shown · non-contrast
Comparison: [DATE]

CLINICAL DATA: Total shoulder arthroplasty

EXAM:
RIGHT SHOULDER - 1 VIEW

[shoulder ap]
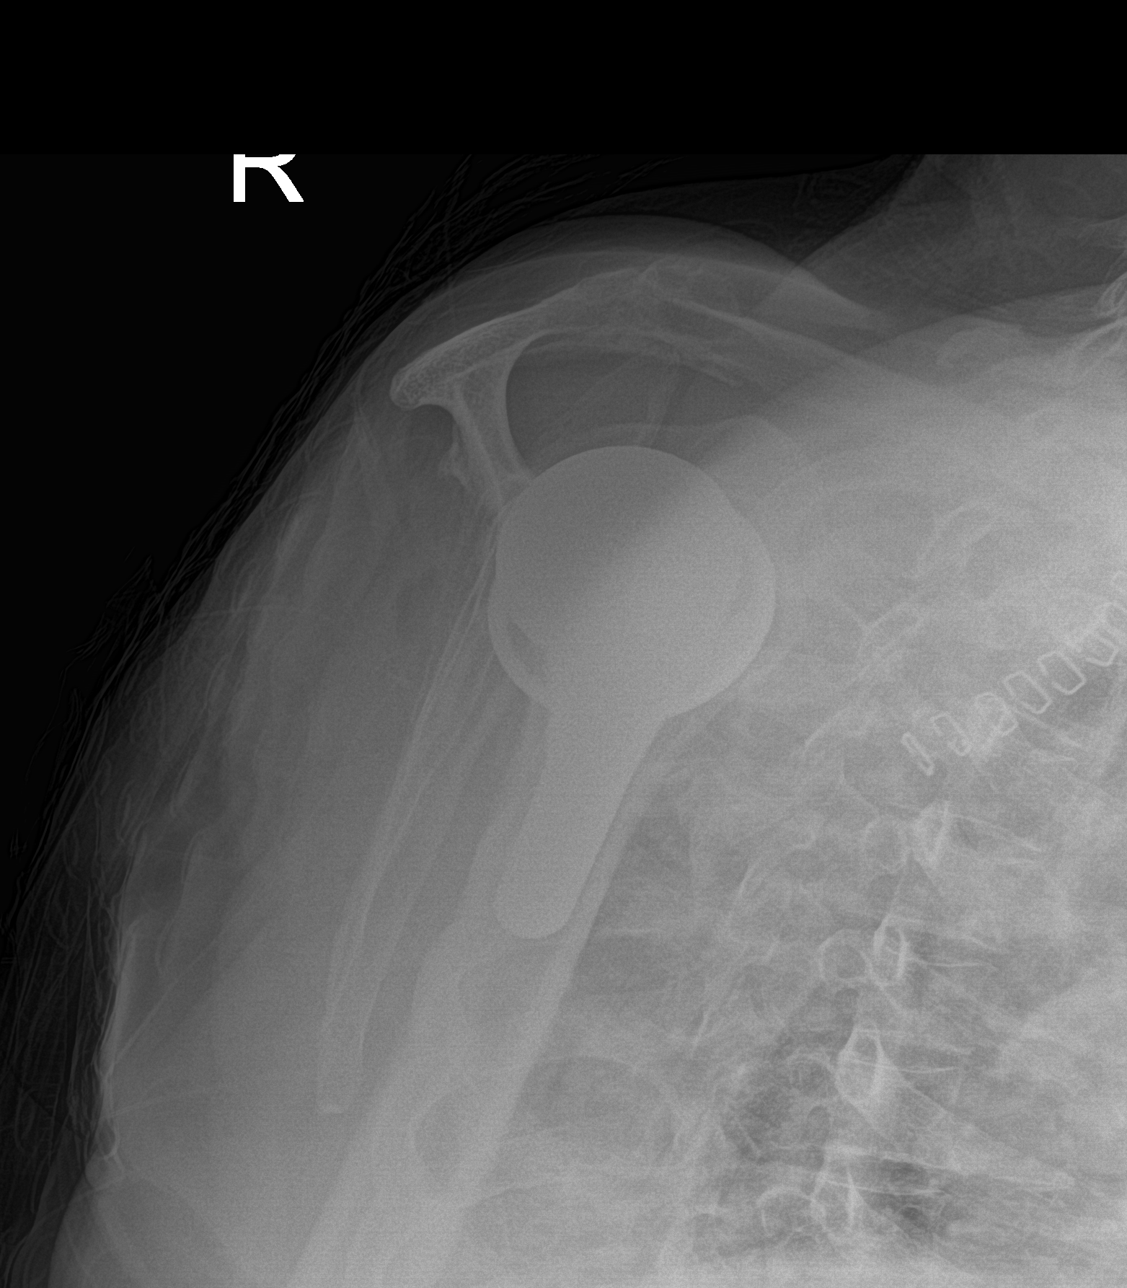

[Series 4: shoulder obl · 0.14mm/px · 2 of 2 slices shown]
[im 1/2]
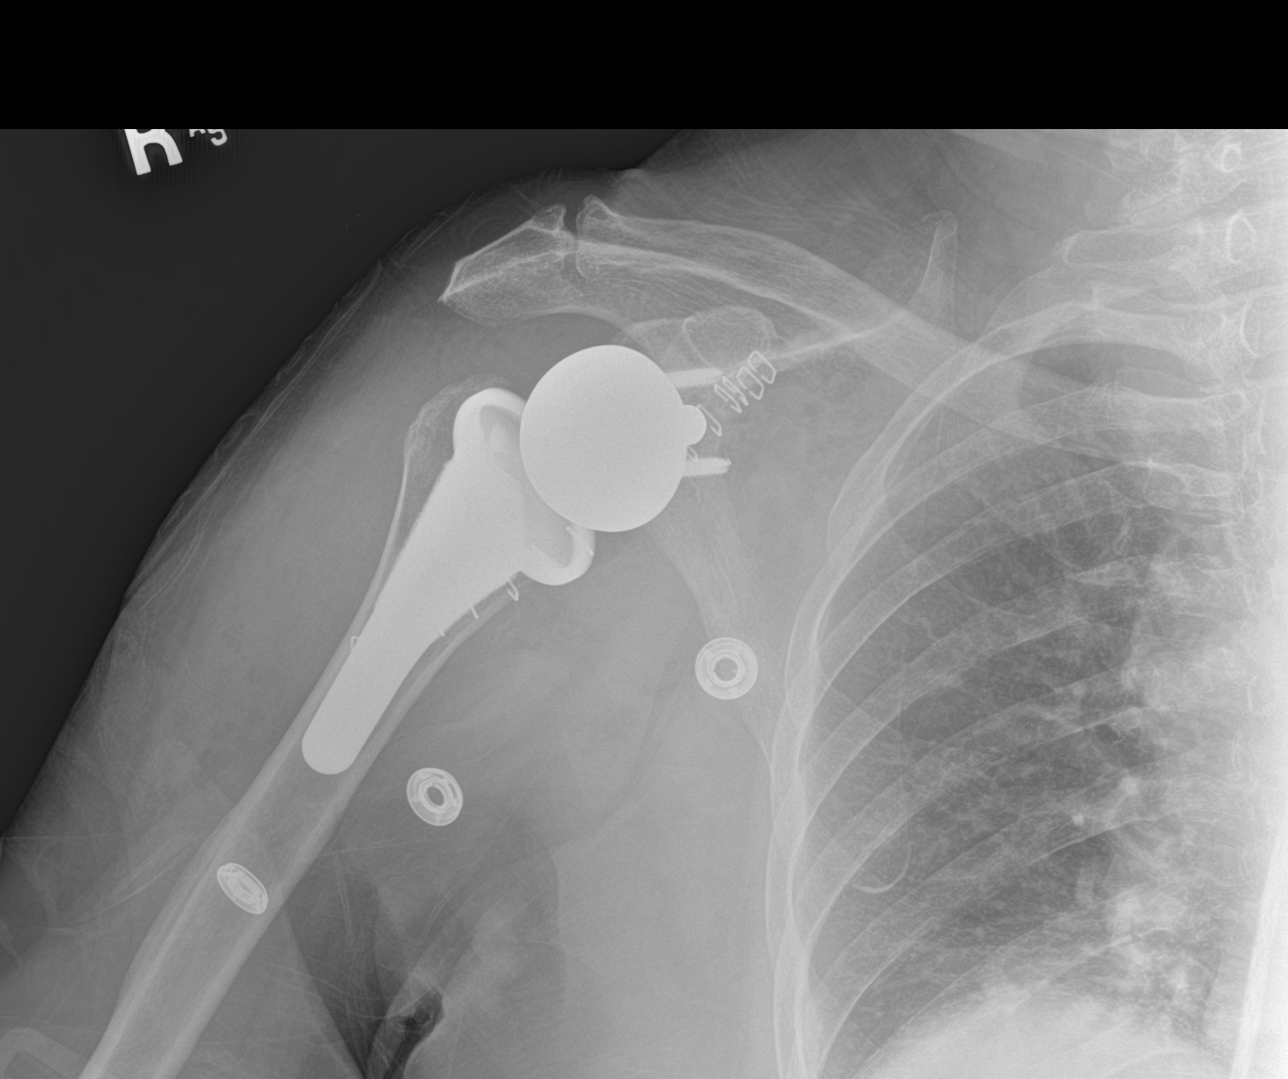
[im 2/2]
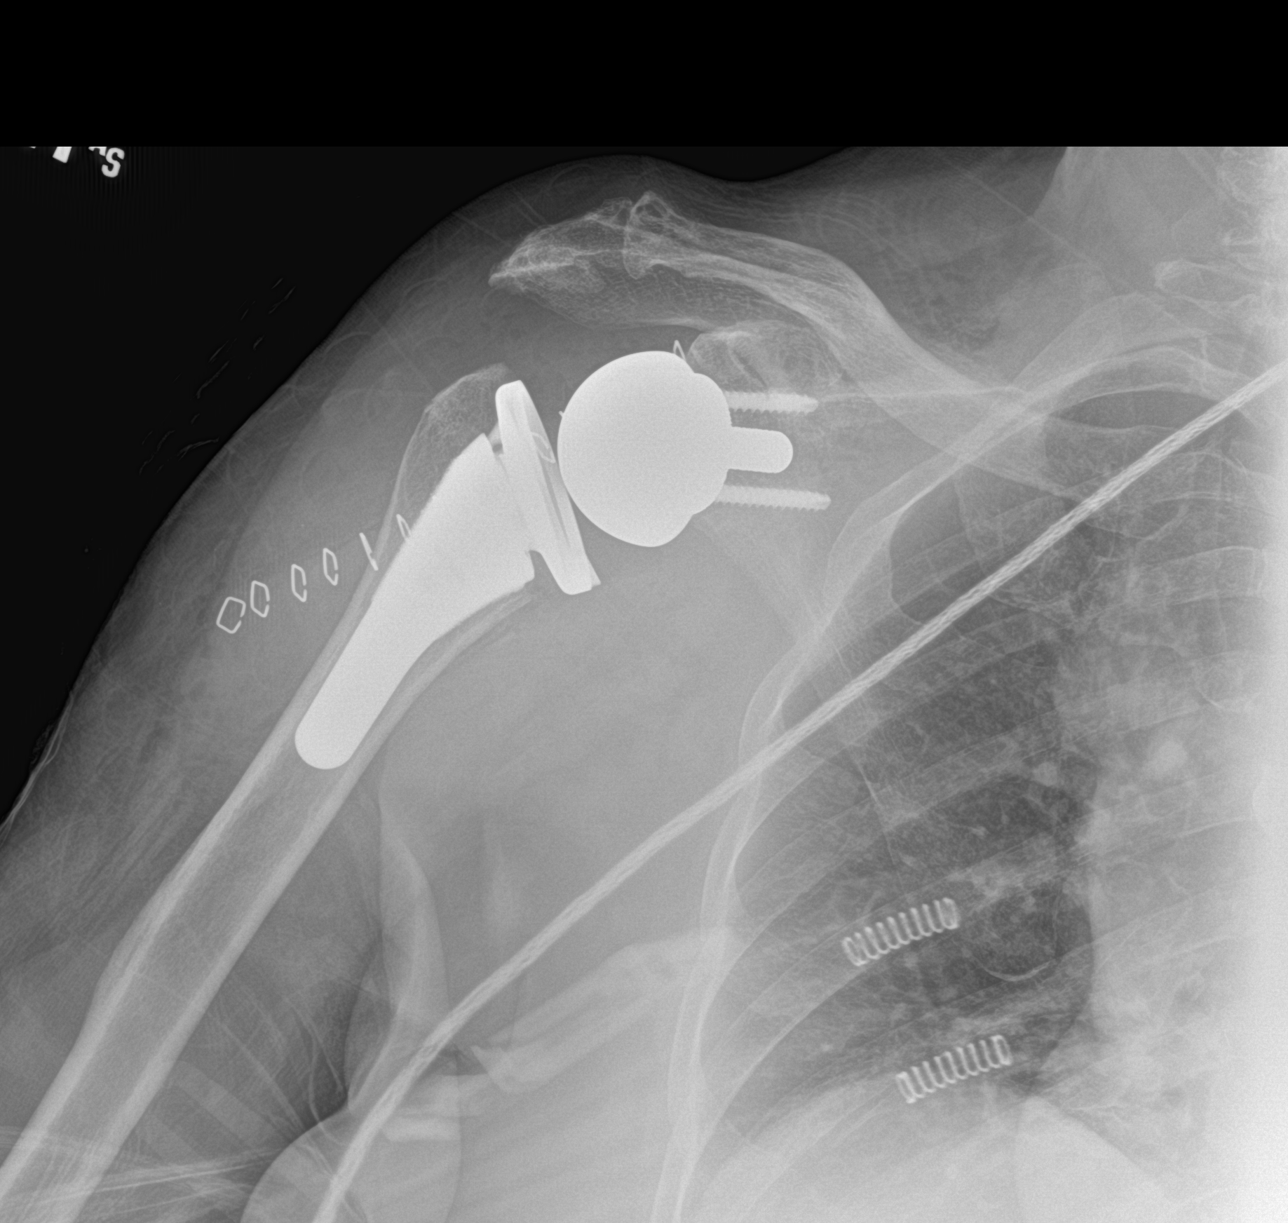

[3 of 3 positions shown; findings below may reference images not displayed]

FINDINGS: Reversed shoulder arthroplasty. Prosthetic components appear well
seated. No fracture dislocation. Expected soft tissue changes post
surgery.
IMPRESSION: Total shoulder reverse arthroplasty without complication.

## 2022-02-15 SURGERY — ARTHROPLASTY, SHOULDER, TOTAL, REVERSE
Anesthesia: General | Site: Shoulder | Laterality: Right

## 2022-02-15 MED ORDER — PHENYLEPHRINE 80 MCG/ML (10ML) SYRINGE FOR IV PUSH (FOR BLOOD PRESSURE SUPPORT): PREFILLED_SYRINGE | INTRAVENOUS | Status: DC | PRN

## 2022-02-15 MED ORDER — CEFAZOLIN SODIUM-DEXTROSE 2-4 GM/100ML-% IV SOLN
INTRAVENOUS | Status: AC
  Filled 2022-02-15: qty 100

## 2022-02-15 MED ORDER — HYDROMORPHONE HCL 1 MG/ML IJ SOLN: 0.2500 mg | INTRAMUSCULAR | Status: DC | PRN

## 2022-02-15 MED ORDER — MAGNESIUM HYDROXIDE 400 MG/5ML PO SUSP: 30.0000 mL | Freq: Every day | ORAL | Status: DC | PRN

## 2022-02-15 MED ORDER — BUPIVACAINE LIPOSOME 1.3 % IJ SUSP
INTRAMUSCULAR | Status: DC | PRN
  Administered 2022-02-15: 10 mL via PERINEURAL

## 2022-02-15 MED ORDER — LACTATED RINGERS IV SOLN
INTRAVENOUS | Status: DC | PRN
Start: 2022-02-15 — End: 2022-02-15

## 2022-02-15 MED ORDER — KETOROLAC TROMETHAMINE 15 MG/ML IJ SOLN
7.5000 mg | Freq: Four times a day (QID) | INTRAMUSCULAR | Status: DC
  Administered 2022-02-15 (×2): 7.5 mg via INTRAVENOUS

## 2022-02-15 MED ORDER — BUPIVACAINE HCL (PF) 0.5 % IJ SOLN
INTRAMUSCULAR | Status: AC
  Filled 2022-02-15: qty 10

## 2022-02-15 MED ORDER — DOCUSATE SODIUM 100 MG PO CAPS
ORAL_CAPSULE | ORAL | Status: AC
  Administered 2022-02-15: 100 mg via ORAL
  Filled 2022-02-15: qty 1

## 2022-02-15 MED ORDER — ONDANSETRON HCL 4 MG/2ML IJ SOLN
INTRAMUSCULAR | Status: DC | PRN
  Administered 2022-02-15: 4 mg via INTRAVENOUS

## 2022-02-15 MED ORDER — GLYCOPYRROLATE 0.2 MG/ML IJ SOLN
INTRAMUSCULAR | Status: DC | PRN
  Administered 2022-02-15: .2 mg via INTRAVENOUS

## 2022-02-15 MED ORDER — CEFAZOLIN SODIUM-DEXTROSE 2-4 GM/100ML-% IV SOLN
2.0000 g | Freq: Four times a day (QID) | INTRAVENOUS | Status: AC
  Administered 2022-02-16: 2 g via INTRAVENOUS

## 2022-02-15 MED ORDER — ROCURONIUM BROMIDE 100 MG/10ML IV SOLN
INTRAVENOUS | Status: DC | PRN
  Administered 2022-02-15: 60 mg via INTRAVENOUS
  Administered 2022-02-15: 10 mg via INTRAVENOUS

## 2022-02-15 MED ORDER — ACETAMINOPHEN 10 MG/ML IV SOLN
INTRAVENOUS | Status: AC
  Filled 2022-02-15: qty 100

## 2022-02-15 MED ORDER — LIDOCAINE HCL (CARDIAC) PF 100 MG/5ML IV SOSY
PREFILLED_SYRINGE | INTRAVENOUS | Status: DC | PRN
  Administered 2022-02-15: 80 mg via INTRAVENOUS

## 2022-02-15 MED ORDER — OXYCODONE HCL 5 MG PO TABS: 5.0000 mg | ORAL_TABLET | Freq: Once | ORAL | Status: DC | PRN

## 2022-02-15 MED ORDER — ONDANSETRON HCL 4 MG PO TABS: 4.0000 mg | ORAL_TABLET | Freq: Four times a day (QID) | ORAL | Status: DC | PRN

## 2022-02-15 MED ORDER — FAMOTIDINE 20 MG PO TABS
ORAL_TABLET | ORAL | Status: AC
  Administered 2022-02-15: 20 mg via ORAL
  Filled 2022-02-15: qty 1

## 2022-02-15 MED ORDER — DEXMEDETOMIDINE (PRECEDEX) IN NS 20 MCG/5ML (4 MCG/ML) IV SYRINGE
PREFILLED_SYRINGE | INTRAVENOUS | Status: DC | PRN
  Administered 2022-02-15 (×3): 4 ug via INTRAVENOUS

## 2022-02-15 MED ORDER — MIDAZOLAM HCL 2 MG/2ML IJ SOLN
INTRAMUSCULAR | Status: AC
  Administered 2022-02-15: 1 mg via INTRAVENOUS
  Filled 2022-02-15: qty 2

## 2022-02-15 MED ORDER — KETOROLAC TROMETHAMINE 15 MG/ML IJ SOLN
INTRAMUSCULAR | Status: AC
  Filled 2022-02-15: qty 1

## 2022-02-15 MED ORDER — PHENYLEPHRINE HCL-NACL 20-0.9 MG/250ML-% IV SOLN
INTRAVENOUS | Status: DC | PRN
  Administered 2022-02-15: 24 ug/min via INTRAVENOUS

## 2022-02-15 MED ORDER — SODIUM CHLORIDE 0.9 % IV SOLN
INTRAVENOUS | Status: DC | PRN
  Administered 2022-02-15: 10 mL

## 2022-02-15 MED ORDER — BISACODYL 10 MG RE SUPP: 10.0000 mg | Freq: Every day | RECTAL | Status: DC | PRN

## 2022-02-15 MED ORDER — PROPOFOL 10 MG/ML IV BOLUS
INTRAVENOUS | Status: DC | PRN
Start: 2022-02-15 — End: 2022-02-15
  Administered 2022-02-15: 20 mg via INTRAVENOUS
  Administered 2022-02-15: 100 mg via INTRAVENOUS

## 2022-02-15 MED ORDER — DIPHENHYDRAMINE HCL 12.5 MG/5ML PO ELIX: 12.5000 mg | ORAL_SOLUTION | ORAL | Status: DC | PRN

## 2022-02-15 MED ORDER — OXYCODONE HCL 5 MG PO TABS
ORAL_TABLET | ORAL | Status: AC
  Filled 2022-02-15: qty 1

## 2022-02-15 MED ORDER — TRANEXAMIC ACID 1000 MG/10ML IV SOLN
INTRAVENOUS | Status: DC | PRN
  Administered 2022-02-15: 1000 mg via TOPICAL

## 2022-02-15 MED ORDER — 0.9 % SODIUM CHLORIDE (POUR BTL) OPTIME
TOPICAL | Status: DC | PRN
  Administered 2022-02-15: 500 mL

## 2022-02-15 MED ORDER — ENOXAPARIN SODIUM 40 MG/0.4ML IJ SOSY: 40.0000 mg | PREFILLED_SYRINGE | INTRAMUSCULAR | Status: DC

## 2022-02-15 MED ORDER — DEXAMETHASONE SODIUM PHOSPHATE 10 MG/ML IJ SOLN
INTRAMUSCULAR | Status: DC | PRN
  Administered 2022-02-15: 10 mg via INTRAVENOUS

## 2022-02-15 MED ORDER — BUPIVACAINE LIPOSOME 1.3 % IJ SUSP
INTRAMUSCULAR | Status: AC
  Filled 2022-02-15: qty 10

## 2022-02-15 MED ORDER — CEFAZOLIN SODIUM-DEXTROSE 2-4 GM/100ML-% IV SOLN
INTRAVENOUS | Status: AC
Start: 2022-02-15 — End: 2022-02-15
  Administered 2022-02-15: 2 g via INTRAVENOUS
  Filled 2022-02-15: qty 100

## 2022-02-15 MED ORDER — ONDANSETRON HCL 4 MG/2ML IJ SOLN
4.0000 mg | Freq: Four times a day (QID) | INTRAMUSCULAR | Status: DC | PRN
  Administered 2022-02-15: 4 mg via INTRAVENOUS

## 2022-02-15 MED ORDER — ACETAMINOPHEN 10 MG/ML IV SOLN
INTRAVENOUS | Status: DC | PRN
  Administered 2022-02-15: 1000 mg via INTRAVENOUS

## 2022-02-15 MED ORDER — PROPOFOL 10 MG/ML IV BOLUS
INTRAVENOUS | Status: AC
  Filled 2022-02-15: qty 20

## 2022-02-15 MED ORDER — OXYCODONE HCL 5 MG/5ML PO SOLN: 5.0000 mg | Freq: Once | ORAL | Status: DC | PRN

## 2022-02-15 MED ORDER — TRANEXAMIC ACID 1000 MG/10ML IV SOLN
INTRAVENOUS | Status: AC
  Filled 2022-02-15: qty 10

## 2022-02-15 MED ORDER — ACETAMINOPHEN 325 MG PO TABS: 325.0000 mg | ORAL_TABLET | Freq: Four times a day (QID) | ORAL | Status: DC | PRN

## 2022-02-15 MED ORDER — TRANEXAMIC ACID 1000 MG/10ML IV SOLN
INTRAVENOUS | Status: AC
Start: 2022-02-15 — End: ?
  Filled 2022-02-15: qty 10

## 2022-02-15 MED ORDER — DOCUSATE SODIUM 100 MG PO CAPS: 100.0000 mg | ORAL_CAPSULE | Freq: Two times a day (BID) | ORAL | Status: DC

## 2022-02-15 MED ORDER — SODIUM CHLORIDE 0.9 % IV SOLN: INTRAVENOUS | Status: DC

## 2022-02-15 MED ORDER — DEXAMETHASONE SODIUM PHOSPHATE 10 MG/ML IJ SOLN
INTRAMUSCULAR | Status: AC
  Filled 2022-02-15: qty 1

## 2022-02-15 MED ORDER — MIDAZOLAM HCL 2 MG/2ML IJ SOLN: 1.0000 mg | Freq: Once | INTRAMUSCULAR | Status: AC

## 2022-02-15 MED ORDER — CHLORHEXIDINE GLUCONATE 0.12 % MT SOLN
OROMUCOSAL | Status: AC
  Filled 2022-02-15: qty 15

## 2022-02-15 MED ORDER — ACETAMINOPHEN 10 MG/ML IV SOLN: 1000.0000 mg | Freq: Once | INTRAVENOUS | Status: DC | PRN

## 2022-02-15 MED ORDER — ONDANSETRON HCL 4 MG/2ML IJ SOLN
INTRAMUSCULAR | Status: AC
  Filled 2022-02-15: qty 2

## 2022-02-15 MED ORDER — BUPIVACAINE-EPINEPHRINE (PF) 0.5% -1:200000 IJ SOLN
INTRAMUSCULAR | Status: DC | PRN
Start: 2022-02-15 — End: 2022-02-15
  Administered 2022-02-15: 30 mL via PERINEURAL

## 2022-02-15 MED ORDER — VITAMIN D3 25 MCG (1000 UNIT) PO TABS
2000.0000 [IU] | ORAL_TABLET | Freq: Every day | ORAL | Status: DC
  Administered 2022-02-15 – 2022-02-16 (×2): 2000 [IU] via ORAL
  Filled 2022-02-15 (×2): qty 2

## 2022-02-15 MED ORDER — DOCUSATE SODIUM 100 MG PO CAPS
ORAL_CAPSULE | ORAL | Status: AC
  Filled 2022-02-15: qty 1

## 2022-02-15 MED ORDER — FENTANYL CITRATE PF 50 MCG/ML IJ SOSY: 50.0000 ug | PREFILLED_SYRINGE | Freq: Once | INTRAMUSCULAR | Status: AC

## 2022-02-15 MED ORDER — FENTANYL CITRATE (PF) 100 MCG/2ML IJ SOLN: 25.0000 ug | INTRAMUSCULAR | Status: DC | PRN

## 2022-02-15 MED ORDER — BUPIVACAINE HCL (PF) 0.5 % IJ SOLN
INTRAMUSCULAR | Status: DC | PRN
  Administered 2022-02-15: 10 mL via PERINEURAL

## 2022-02-15 MED ORDER — FLEET ENEMA 7-19 GM/118ML RE ENEM
1.0000 | ENEMA | Freq: Once | RECTAL | Status: DC | PRN
Start: 2022-02-15 — End: 2022-02-16

## 2022-02-15 MED ORDER — ONDANSETRON HCL 4 MG/2ML IJ SOLN
INTRAMUSCULAR | Status: AC
Start: 2022-02-15 — End: 2022-02-15
  Filled 2022-02-15: qty 2

## 2022-02-15 MED ORDER — OXYCODONE HCL 5 MG PO TABS
5.0000 mg | ORAL_TABLET | ORAL | Status: DC | PRN
  Administered 2022-02-15 – 2022-02-16 (×2): 5 mg via ORAL

## 2022-02-15 MED ORDER — ONDANSETRON HCL 4 MG/2ML IJ SOLN: 4.0000 mg | Freq: Once | INTRAMUSCULAR | Status: DC | PRN

## 2022-02-15 MED ORDER — METOCLOPRAMIDE HCL 5 MG/ML IJ SOLN: 5.0000 mg | Freq: Three times a day (TID) | INTRAMUSCULAR | Status: DC | PRN

## 2022-02-15 MED ORDER — PHENYLEPHRINE HCL (PRESSORS) 10 MG/ML IV SOLN
INTRAVENOUS | Status: DC | PRN
  Administered 2022-02-15 (×2): 40 ug via INTRAVENOUS
  Administered 2022-02-15 (×2): 80 ug via INTRAVENOUS

## 2022-02-15 MED ORDER — SUGAMMADEX SODIUM 200 MG/2ML IV SOLN
INTRAVENOUS | Status: DC | PRN
  Administered 2022-02-15: 30 mg via INTRAVENOUS
  Administered 2022-02-15: 50 mg via INTRAVENOUS
  Administered 2022-02-15: 20 mg via INTRAVENOUS

## 2022-02-15 MED ORDER — FENTANYL CITRATE PF 50 MCG/ML IJ SOSY
PREFILLED_SYRINGE | INTRAMUSCULAR | Status: AC
  Administered 2022-02-15: 50 ug via INTRAVENOUS
  Filled 2022-02-15: qty 1

## 2022-02-15 MED ORDER — BUPIVACAINE-EPINEPHRINE (PF) 0.5% -1:200000 IJ SOLN
INTRAMUSCULAR | Status: AC
  Filled 2022-02-15: qty 30

## 2022-02-15 MED ORDER — GLYCOPYRROLATE 0.2 MG/ML IJ SOLN
INTRAMUSCULAR | Status: AC
  Filled 2022-02-15: qty 1

## 2022-02-15 MED ORDER — METOCLOPRAMIDE HCL 10 MG PO TABS: 5.0000 mg | ORAL_TABLET | Freq: Three times a day (TID) | ORAL | Status: DC | PRN

## 2022-02-15 MED ORDER — CEFAZOLIN SODIUM-DEXTROSE 2-4 GM/100ML-% IV SOLN
INTRAVENOUS | Status: AC
  Administered 2022-02-15: 2 g via INTRAVENOUS
  Filled 2022-02-15: qty 100

## 2022-02-15 MED ORDER — CEFAZOLIN SODIUM-DEXTROSE 2-4 GM/100ML-% IV SOLN
INTRAVENOUS | Status: AC
Start: 2022-02-15 — End: 2022-02-15
  Filled 2022-02-15: qty 100

## 2022-02-15 MED ORDER — ASPIRIN 81 MG PO TBEC
81.0000 mg | DELAYED_RELEASE_TABLET | ORAL | Status: DC
  Administered 2022-02-16: 81 mg via ORAL
  Filled 2022-02-15: qty 1

## 2022-02-15 MED ORDER — KETOROLAC TROMETHAMINE 15 MG/ML IJ SOLN
15.0000 mg | Freq: Once | INTRAMUSCULAR | Status: AC
Start: 2022-02-15 — End: 2022-02-15
  Administered 2022-02-15: 15 mg via INTRAVENOUS

## 2022-02-15 MED ORDER — SODIUM CHLORIDE FLUSH 0.9 % IV SOLN
INTRAVENOUS | Status: AC
  Filled 2022-02-15: qty 20

## 2022-02-15 MED ORDER — SODIUM CHLORIDE 0.9 % IR SOLN
Status: DC | PRN
  Administered 2022-02-15: 3000 mL

## 2022-02-15 SURGICAL SUPPLY — 65 items
BASEPLATE SHLD REV 20 POST (Shoulder) ×1 IMPLANT
BIT DRILL CANN 6X20 (BIT) ×1 IMPLANT
BIT DRILL QUICK CONN 2.5 (BIT) ×1 IMPLANT
BLADE REAMER 1 REV SHOULDER (BLADE) ×1 IMPLANT
BLADE SAW SAG 25X90X1.19 (BLADE) ×2 IMPLANT
CHLORAPREP W/TINT 26 (MISCELLANEOUS) ×3 IMPLANT
COOLER POLAR GLACIER W/PUMP (MISCELLANEOUS) ×2 IMPLANT
COVER BACK TABLE REUSABLE LG (DRAPES) ×2 IMPLANT
DRAPE 3/4 80X56 (DRAPES) ×2 IMPLANT
DRAPE INCISE IOBAN 66X45 STRL (DRAPES) ×2 IMPLANT
DRSG OPSITE POSTOP 4X8 (GAUZE/BANDAGES/DRESSINGS) ×2 IMPLANT
ELECT BLADE 6.5 EXT (BLADE) ×1 IMPLANT
ELECT CAUTERY BLADE 6.4 (BLADE) ×2 IMPLANT
ELECT REM PT RETURN 9FT ADLT (ELECTROSURGICAL) ×2
ELECTRODE REM PT RTRN 9FT ADLT (ELECTROSURGICAL) ×1 IMPLANT
GAUZE XEROFORM 1X8 LF (GAUZE/BANDAGES/DRESSINGS) ×2 IMPLANT
GLENOSPHERE SHLD REV +3 LAT (Shoulder) ×1 IMPLANT
GLOVE BIO SURGEON STRL SZ7.5 (GLOVE) ×9 IMPLANT
GLOVE BIO SURGEON STRL SZ8 (GLOVE) ×10 IMPLANT
GLOVE BIOGEL PI IND STRL 8 (GLOVE) ×2 IMPLANT
GLOVE BIOGEL PI INDICATOR 8 (GLOVE) ×2
GLOVE SURG UNDER LTX SZ8 (GLOVE) ×2 IMPLANT
GOWN STRL REUS W/ TWL LRG LVL3 (GOWN DISPOSABLE) ×1 IMPLANT
GOWN STRL REUS W/ TWL XL LVL3 (GOWN DISPOSABLE) ×1 IMPLANT
GOWN STRL REUS W/TWL LRG LVL3 (GOWN DISPOSABLE) ×2
GOWN STRL REUS W/TWL XL LVL3 (GOWN DISPOSABLE) ×2
HOOD PEEL AWAY FLYTE STAYCOOL (MISCELLANEOUS) ×6 IMPLANT
IV NS IRRIG 3000ML ARTHROMATIC (IV SOLUTION) ×2 IMPLANT
KIT STABILIZATION SHOULDER (MISCELLANEOUS) ×2 IMPLANT
KIT TURNOVER KIT A (KITS) ×2 IMPLANT
MANIFOLD NEPTUNE II (INSTRUMENTS) ×2 IMPLANT
MASK FACE SPIDER DISP (MASK) ×2 IMPLANT
MAT ABSORB  FLUID 56X50 GRAY (MISCELLANEOUS) ×2
MAT ABSORB FLUID 56X50 GRAY (MISCELLANEOUS) ×1 IMPLANT
NDL MAYO CATGUT SZ1 (NEEDLE) IMPLANT
NDL SAFETY ECLIPSE 18X1.5 (NEEDLE) ×1 IMPLANT
NDL SPNL 20GX3.5 QUINCKE YW (NEEDLE) ×1 IMPLANT
NEEDLE HYPO 18GX1.5 SHARP (NEEDLE) ×2
NEEDLE MAYO CATGUT SZ1 (NEEDLE) IMPLANT
NEEDLE SPNL 20GX3.5 QUINCKE YW (NEEDLE) ×2 IMPLANT
NS IRRIG 500ML POUR BTL (IV SOLUTION) ×2 IMPLANT
PACK ARTHROSCOPY SHOULDER (MISCELLANEOUS) ×2 IMPLANT
PAD ARMBOARD 7.5X6 YLW CONV (MISCELLANEOUS) ×2 IMPLANT
PAD WRAPON POLAR SHDR UNIV (MISCELLANEOUS) ×1 IMPLANT
PIN BONE FIXATION TEMP 2.5 (PIN) ×1 IMPLANT
PULSAVAC PLUS IRRIG FAN TIP (DISPOSABLE) ×2
SCREW LOCK SHLD REV 4.5X30 (Screw) ×1 IMPLANT
SCREW LOCK SHLD REV 4.5X36 (Screw) ×1 IMPLANT
SLING ULTRA II M (MISCELLANEOUS) ×1 IMPLANT
SPONGE T-LAP 18X18 ~~LOC~~+RFID (SPONGE) ×4 IMPLANT
STAPLER SKIN PROX 35W (STAPLE) ×2 IMPLANT
STEM HUM MICRO SZ 9 (Stem) ×1 IMPLANT
SUT ETHIBOND 0 MO6 C/R (SUTURE) ×2 IMPLANT
SUT FIBERWIRE #2 38 BLUE 1/2 (SUTURE) ×8
SUT VIC AB 0 CT1 36 (SUTURE) ×2 IMPLANT
SUT VIC AB 2-0 CT1 27 (SUTURE) ×4
SUT VIC AB 2-0 CT1 TAPERPNT 27 (SUTURE) ×2 IMPLANT
SUTURE FIBERWR #2 38 BLUE 1/2 (SUTURE) ×4 IMPLANT
SYR 10ML LL (SYRINGE) ×2 IMPLANT
SYR 30ML LL (SYRINGE) ×2 IMPLANT
TIP FAN IRRIG PULSAVAC PLUS (DISPOSABLE) ×1 IMPLANT
TRAY HUM REV SHOULDER 36 +3 (Shoulder) ×1 IMPLANT
TRAY HUMERAL NEUTRAL EXT 6 (Shoulder) ×1 IMPLANT
WATER STERILE IRR 500ML POUR (IV SOLUTION) ×2 IMPLANT
WRAPON POLAR PAD SHDR UNIV (MISCELLANEOUS) ×2

## 2022-02-15 NOTE — Evaluation (Signed)
Physical Therapy Evaluation Patient Details Name: Jennifer Rubio MRN: [id number] DOB: 1950/11/25 Today's Date: 02/15/2022  History of Present Illness  71 y/o female s/p R total shoulder repalcement 5/25.  Clinical Impression  Pt eager to work with PT and ultimately did quite well. She does not typically need and AD and was able to ambulate 200 ft and negotiate up/down steps w/o one today.  She does endorse a slower more guarded cadence than baseline but overall showed good safety and activity tolerance.  Pt and family educated on precautions, expected course of recovery and implications for in-home management with single UE.  Family will be able to provide consistent (not likely 24/7) assistance, questions answered, f/u PT per surgeon prescribed timeline.      Recommendations for follow up therapy are one component of a multi-disciplinary discharge planning process, led by the attending physician.  Recommendations may be updated based on patient status, additional functional criteria and insurance authorization.  Follow Up Recommendations Follow physician's recommendations for discharge plan and follow up therapies    Assistance Recommended at Discharge Intermittent Supervision/Assistance  Patient can return home with the following  A little help with bathing/dressing/bathroom;Assistance with cooking/housework    Equipment Recommendations None recommended by PT  Recommendations for Other Services       Functional Status Assessment Patient has had a recent decline in their functional status and demonstrates the ability to make significant improvements in function in a reasonable and predictable amount of time.     Precautions / Restrictions Precautions Precautions: Shoulder Precaution Booklet Issued: Yes (comment) Required Braces or Orthoses: Sling Restrictions Weight Bearing Restrictions: Yes RUE Weight Bearing: Non weight bearing      Mobility  Bed Mobility                General bed mobility comments: in recliner on arrival, NT    Transfers Overall transfer level: Independent Equipment used: None               General transfer comment: able to rise multiple times t/o session without direct assist    Ambulation/Gait Ambulation/Gait assistance: Supervision Gait Distance (Feet): 200 Feet Assistive device: None         General Gait Details: Pt with slow but steady gait.  Consistent (though slightly guarded) cadence with no LOBs or fatgiue.  Stairs Stairs: Yes Stairs assistance: Supervision Stair Management: One rail Right, One rail Left, Alternating pattern Number of Stairs: 4 General stair comments: Pt able to easily and confidently negotiate up/down steps with reciprocal and safe effort  Wheelchair Mobility    Modified Rankin (Stroke Patients Only)       Balance Overall balance assessment: Modified Independent                                           Pertinent Vitals/Pain Pain Assessment Pain Assessment:  (block still in place)    Home Living Family/patient expects to be discharged to:: Private residence Living Arrangements: Children (daughter works, gone t/o day) Available Help at Discharge: Family;Neighbor;Friend(s);Available PRN/intermittently   Home Access: Stairs to enter Entrance Stairs-Rails: Right;Left Entrance Stairs-Number of Steps: 5     Home Equipment: Agricultural consultant (2 wheels);Cane - single point      Prior Function Prior Level of Function : Independent/Modified Independent             Mobility Comments: no AD, drives, out  of home almost daily, active in the community       Hand Dominance        Extremity/Trunk Assessment   Upper Extremity Assessment Upper Extremity Assessment: Overall WFL for tasks assessed (R UE in sling)    Lower Extremity Assessment Lower Extremity Assessment: Overall WFL for tasks assessed       Communication   Communication: No difficulties   Cognition Arousal/Alertness: Awake/alert Behavior During Therapy: WFL for tasks assessed/performed Overall Cognitive Status: Within Functional Limits for tasks assessed                                          General Comments      Exercises     Assessment/Plan    PT Assessment Patient needs continued PT services  PT Problem List Decreased range of motion;Decreased activity tolerance;Decreased mobility;Decreased safety awareness;Decreased knowledge of precautions;Pain       PT Treatment Interventions Gait training;Stair training;Functional mobility training;Therapeutic activities;Therapeutic exercise;Balance training;Patient/family education    PT Goals (Current goals can be found in the Care Plan section)  Acute Rehab PT Goals Patient Stated Goal: go home tomorrow PT Goal Formulation: With patient/family Time For Goal Achievement: 03/01/22 Potential to Achieve Goals: Good    Frequency 7X/week     Co-evaluation               AM-PAC PT "6 Clicks" Mobility  Outcome Measure Help needed turning from your back to your side while in a flat bed without using bedrails?: None Help needed moving from lying on your back to sitting on the side of a flat bed without using bedrails?: A Little Help needed moving to and from a bed to a chair (including a wheelchair)?: None Help needed standing up from a chair using your arms (e.g., wheelchair or bedside chair)?: None Help needed to walk in hospital room?: None Help needed climbing 3-5 steps with a railing? : None 6 Click Score: 23    End of Session Equipment Utilized During Treatment: Gait belt Activity Tolerance: Patient tolerated treatment well Patient left: in chair;with family/visitor present;with call bell/phone within reach Nurse Communication: Mobility status PT Visit Diagnosis: Muscle weakness (generalized) (M62.81);Difficulty in walking, not elsewhere classified (R26.2);Pain Pain - Right/Left:  Right Pain - part of body: Shoulder    Time: 6644-0347 PT Time Calculation (min) (ACUTE ONLY): 31 min   Charges:   PT Evaluation $PT Eval Low Complexity: 1 Low PT Treatments $Therapeutic Activity: 8-22 mins        Malachi Pro, DPT 02/15/2022, 5:25 PM

## 2022-02-15 NOTE — Anesthesia Preprocedure Evaluation (Addendum)
Anesthesia Evaluation  Patient identified by MRN, date of birth, ID band Patient awake    Reviewed: Allergy & Precautions, NPO status , Patient's Chart, lab work & pertinent test results  History of Anesthesia Complications Negative for: history of anesthetic complications  Airway Mallampati: II  TM Distance: >3 FB Neck ROM: Full    Dental no notable dental hx. (+) Teeth Intact   Pulmonary neg pulmonary ROS, neg sleep apnea, neg COPD, Patient abstained from smoking.Not current smoker,    Pulmonary exam normal breath sounds clear to auscultation       Cardiovascular Exercise Tolerance: Good METShypertension, Pt. on medications (-) CAD and (-) Past MI (-) dysrhythmias  Rhythm:Regular Rate:Normal - Systolic murmurs    Neuro/Psych negative neurological ROS  negative psych ROS   GI/Hepatic neg GERD  ,(+)     (-) substance abuse  ,   Endo/Other  neg diabetes  Renal/GU negative Renal ROS     Musculoskeletal  (+) Arthritis ,   Abdominal   Peds  Hematology   Anesthesia Other Findings Past Medical History: 09/25/2011: Cellulitis and abscess of leg 05/15/2021: History of 2019 novel coronavirus disease (COVID-19) No date: History of kidney stones No date: Hypertension No date: Osteoarthritis 09/25/2011: Urinary tract infection  Reproductive/Obstetrics                            Anesthesia Physical Anesthesia Plan  ASA: 2  Anesthesia Plan: General   Post-op Pain Management: Regional block* and Ofirmev IV (intra-op)*   Induction: Intravenous  PONV Risk Score and Plan: 3 and Ondansetron, Dexamethasone, Midazolam and Treatment may vary due to age or medical condition  Airway Management Planned: Oral ETT  Additional Equipment: None  Intra-op Plan:   Post-operative Plan: Extubation in OR  Informed Consent: I have reviewed the patients History and Physical, chart, labs and discussed the  procedure including the risks, benefits and alternatives for the proposed anesthesia with the patient or authorized representative who has indicated his/her understanding and acceptance.     Dental advisory given  Plan Discussed with: CRNA and Surgeon  Anesthesia Plan Comments: (Discussed risks of anesthesia with patient, including PONV, sore throat, lip/dental/eye damage. Rare risks discussed as well, such as cardiorespiratory and neurological sequelae, and allergic reactions. Discussed the role of CRNA in patient's perioperative care. Patient understands. Discussed r/b/a of interscalene block, including elective nature. Risks discussed: - Rare: bleeding, infection, nerve damage - shortness of breath from hemidiaphragmatic paralysis - unilateral horner's syndrome - poor/non-working blocks - reactions and toxicity to local anesthetic Patient understands and agrees. )        Anesthesia Quick Evaluation

## 2022-02-15 NOTE — Op Note (Signed)
02/15/2022  1:13 PM  Patient:   Jennifer Rubio  Pre-Op Diagnosis:   Massive irreparable rotator cuff tear with early cuff arthropathy, right shoulder.  Post-Op Diagnosis:   Same  Procedure:   Reverse right total shoulder arthroplasty.  Surgeon:   Maryagnes Amos, MD  Assistant:   Horris Latino, PA-C  Anesthesia:   General endotracheal with an interscalene block using Exparel placed preoperatively by the anesthesiologist.  Findings:   As above.  Complications:   None  EBL:   125 cc  Fluids:   1200 cc crystalloid  UOP:   None  TT:   None  Drains:   None  Closure:   Staples  Implants:   All press-fit Zimmer-Biomet Comprehensive system with a 9 mm Identity micro-humeral stem, a -6 mm extended neutral Identity humeral tray with a +3 mm insert, and a Zimmer TM mini-base plate with a 20 mm post and a +3 mm lateralized eccentric 36 mm glenosphere.  Brief Clinical Note:   The patient is a 71 year old female with a [place] history of progressively worsening pain and weakness of his right shoulder. His symptoms have progressed despite medications, activity modification, etc. His history and examination consistent with a massive irreparable rotator cuff tear with early cuff arthropathy, all of which were confirmed by MRI scan preoperatively. The patient presents at this time for a reverse right total shoulder arthroplasty.  Procedure:   The patient underwent placement of an interscalene block using Exparel by the anesthesiologist in the preoperative holding area before being brought into the operating room and lain in the supine position. The patient then underwent general endotracheal intubation and anesthesia before the patient was repositioned in the beach chair position using the beach chair positioner. The right shoulder and upper extremity were prepped with ChloraPrep solution before being draped sterilely. Preoperative antibiotics were administered. A timeout was performed to verify the  appropriate surgical site.    A standard anterior approach to the shoulder was made through an approximately 4-5 inch incision. The incision was carried down through the subcutaneous tissues to expose the deltopectoral fascia. The interval between the deltoid and pectoralis muscles was identified and this plane developed, retracting the cephalic vein laterally with the deltoid muscle. The conjoined tendon was identified. Its lateral margin was dissected and the Kolbel self-retraining retractor inserted. The "three sisters" were identified and cauterized. Bursal tissues were removed to improve visualization. The biceps tendon was noted to have been torn and retracted distally, so no biceps tenodesis was performed.   Attention was redirected to the glenoid. The labrum was debrided circumferentially before the center of the glenoid was marked with electrocautery. The guidewire was drilled into the glenoid vault using the appropriate guide. After verifying its position, it was overreamed with the 20 mm reamer followed by the mini-baseplate reamer to create a flat surface. The permanent Zimmer TM mini-baseplate with the 20 mm post was impacted into place. It was stabilized with superior and inferior screws of the appropriate length. Locking caps were applied over each of these screws. The permanent +3 lateralized 36 mm eccentric glenosphere was then impacted into place and its Morse taper locking mechanism verified using manual distraction.  Attention was directed to the humeral side. The humeral canal was reamed sequentially beginning with the end-cutting reamer then progressing from a 4 mm reamer up to a 10 mm reamer. This provided excellent circumferential chatter. The canal was broached beginning with a #7 broach and progressing to a #10 broach. The  plastic stem was inserted into the end of the broach and the proximal reaming performed. A trial reduction was performed using the -6 mm extended neutral humeral  platform with the +0 mm insert.  However, the shoulder was too tight.    The trial components were removed and an additional 5 mm resected off the proximal humerus. The #9 broach was then impacted into place and countersunk several millimeters. The plastic stem was inserted into the end of the broach and the proximal reaming performed. A trial reduction was performed using the -6 mm extended neutral humeral platform with the +0 mm and +3 mm inserts. With the +3 mm insert, the arm demonstrated excellent range of motion as the hand could be brought across the chest to the opposite shoulder and brought to the top of the patient's head and to the patient's ear. The shoulder appeared stable throughout this range of motion. The joint was dislocated and the trial components removed.   The permanent #9 Identity micro-stem was connected with the -6 mm extended neutral humeral platform on the back table before this construct was impacted into place with care taken to maintain the appropriate version. The +3 mm insert was impacted into place. The shoulder was relocated using two finger pressure and again placed through a range of motion with the findings as described above.  The wound was copiously irrigated with sterile saline solution using the jet lavage system before a solution of 30 cc of 0.5% Sensorcaine with epinephrine and 10 cc of Exparel was injected into the pericapsular and peri-incisional tissues to help with postoperative analgesia. The subscapularis tendon was reapproximated using #2 FiberWire interrupted sutures. The deltopectoral interval was closed using #0 Vicryl interrupted sutures before the subcutaneous tissues were closed using 2-0 Vicryl interrupted sutures. The skin was closed using staples. Prior to closing the skin, 1 g of transexemic acid in 10 cc of normal saline was injected intra-articularly to help with postoperative bleeding. A sterile occlusive dressing was applied to the wound before  the arm was placed into a shoulder immobilizer with an abduction pillow. A Polar Care system also was applied to the shoulder. The patient was then transferred back to a hospital bed before being awakened, extubated, and returned to the recovery room in satisfactory condition after tolerating the procedure well.

## 2022-02-15 NOTE — Transfer of Care (Signed)
Immediate Anesthesia Transfer of Care Note  Patient: Jennifer Rubio  Procedure(s) Performed: REVERSE SHOULDER ARTHROPLASTY WITH POSSIBLE BICEPS TENODESIS (Right: Shoulder)  Patient Location: PACU  Anesthesia Type:General  Level of Consciousness: drowsy  Airway & Oxygen Therapy: Patient Spontanous Breathing and Patient connected to nasal cannula oxygen  Post-op Assessment: Report given to RN  Post vital signs: Reviewed and stable  Last Vitals:  Vitals Value Taken Time  BP 117/76 02/15/22 1320  Temp    Pulse 79 02/15/22 1323  Resp 20 02/15/22 1323  SpO2 100 % 02/15/22 1323  Vitals shown include unvalidated device data.  Last Pain:  Vitals:   02/15/22 0844  TempSrc: Temporal  PainSc:          Complications: No notable events documented.

## 2022-02-15 NOTE — Anesthesia Procedure Notes (Signed)
Anesthesia Regional Block: Interscalene brachial plexus block  ? ?Pre-Anesthetic Checklist: , timeout performed,  Correct Patient, Correct Site, Correct Laterality,  Correct Procedure, Correct Position, site marked,  Risks and benefits discussed,  Surgical consent,  Pre-op evaluation,  At surgeon's request and post-op pain management ? ?Laterality: Right ? ?Prep: chloraprep     ?  ?Needles:  ?Injection technique: Single-shot ? ?Needle Type: Echogenic Needle   ? ? ?Needle Length: 4cm  ?Needle Gauge: 25  ? ? ? ?Additional Needles: ? ? ?Procedures:,,,, ultrasound used (permanent image in chart),,    ?Narrative:  ?Injection made incrementally with aspirations every 5 mL. ? ?Performed by: Personally  ?Anesthesiologist: Ramir Malerba, MD ? ?Additional Notes: ?Patient's chart reviewed and they were deemed appropriate candidate for procedure, at surgeon's request. Patient educated about risks, benefits, and alternatives of the block including but not limited to: temporary or permanent nerve damage, bleeding, infection, damage to surround tissues, pneumothorax, hemidiaphragmatic paralysis, unilateral Horner's syndrome, block failure, local anesthetic toxicity. Patient expressed understanding. A formal time-out was conducted consistent with institution rules. ? ?Monitors were applied, and minimal sedation used (see nursing record). The site was prepped with skin prep and allowed to dry, and sterile gloves were used. A high frequency linear ultrasound probe with probe cover was utilized throughout. C5-7 nerve roots located and appeared anatomically normal, local anesthetic injected around them, and echogenic block needle trajectory was monitored throughout. Aspiration performed every 5ml. Lung and blood vessels were avoided. All injections were performed without resistance and free of blood and paresthesias. The patient tolerated the procedure well. ? ?Injectate: 10ml exparel + 10ml 0.5% bupivacaine  ? ? ? ?

## 2022-02-15 NOTE — Anesthesia Procedure Notes (Signed)
Procedure Name: Intubation Date/Time: 02/15/2022 10:34 AM Performed by: Gayland Curry, CRNA Pre-anesthesia Checklist: Patient identified, Emergency Drugs available, Suction available and Patient being monitored Patient Re-evaluated:Patient Re-evaluated prior to induction Oxygen Delivery Method: Circle system utilized Preoxygenation: Pre-oxygenation with 100% oxygen Induction Type: IV induction Ventilation: Mask ventilation without difficulty Laryngoscope Size: Mac and 3 Grade View: Grade I Tube type: Oral Tube size: 6.5 mm Number of attempts: 1 Placement Confirmation: ETT inserted through vocal cords under direct vision, positive ETCO2 and breath sounds checked- equal and bilateral Secured at: 21 cm Tube secured with: Tape Dental Injury: Teeth and Oropharynx as per pre-operative assessment  Comments: Left upper front tooth chip noted prior to intubation, same post intubation.

## 2022-02-15 NOTE — H&P (Signed)
History of Present Illness: Jennifer Rubio is a 71 y.o. who presents today for history and physical. She is to undergo a reverse right shoulder arthroplasty. Date of surgery 02/15/2022. Last seen in the office on 01/05/2022. There is been no change in her condition. Patient states that the pain is increased to the point that it is significantly interfering with her activities of daily living she wishes to proceed with surgery.  The patient's symptoms began over a year ago and developed without any specific cause or injury. 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 She saw her primary care provider for the symptoms last month. X-rays were obtained and she was referred to orthopedics. She saw Cranston Neighbor, PA-C, who ordered an MRI scan and referred her to me for further evaluation and treatment. The patient describes the symptoms as moderate (patient is active but has had to make modifications or give up activities) and have the quality of being aching, miserable, nagging, stabbing and tender. The pain is localized to the lateral arm/shoulder and localized to the anterior shoulder. These symptoms are aggravated with normal daily activities, with sleeping, carrying heavy objects, at higher levels of activity, with overhead activity and reaching behind the back. She has tried non-steroidal anti-inflammatories (Aleve ) and narcotics with limited benefit. She has tried rest and activity modification with no significant benefit. She has not tried any physical therapy or steroid injections for the symptoms.  Past Medical History:   Allergic rhinitis   Allergy Sulfur   Anemia   Arthritis   GERD (gastroesophageal reflux disease)   History of cataract 2020   Hyperlipidemia   Hypertension   Obesity (BMI 30.0-34.9), unspecified 09/30/2016   Vaginal discharge 10/26/2015 (Since bladder tac with mesh 2013)   Vitamin D deficiency  07/12/2016   Past Surgical History:  HYSTERECTOMY 1991   bladder sling 2013   bladder tac with mesh 2013   POPLITEAL SYNOVIAL CYST EXCISION Right 2015   RIGHT TOTAL KNEE REPLACEMENT Right 2015   LENGTHENING/SHORTENING SINGLE TENDON LOWER LEG/ANKLE Right 06/07/2016  Procedure: LENGTHENING OR SHORTENING OF TENDON, LEG OR ANKLE; SINGLE TENDON (SEPARATE PROCEDURE); Surgeon: Morton Amy, MD; Location: [facility]; Service: Orthopedics; Laterality: Right;   TRANSFER/TRANSPLANT SINGLE TENDON TO FOOT Right /14/2017  Procedure: TRANSFER OR TRANSPLANT OF SINGLE TENDON (WITH MUSCLE REDIRECTION OR REROUTING); DEEP; Surgeon: Morton Amy, MD; Location: [facility]; Service: Orthopedics; Laterality: Right;   BUNION CORRECTION Right 06/07/2016  Procedure: CORRECTION, HALLUX VALGUS (BUNIONECTOMY), WITH SESAMOIDECTOMY, WHEN PERFORMED; WITH DOUBLE OSTEOTOMY, ANY METHOD; Surgeon: Morton Amy, MD; Location: [facility]; Service: Orthopedics; Laterality: Right;   OSTEOTOMY METATARSAL Right 06/07/2016  Procedure: OSTEOTOMY, WITH OR WITHOUT LENGTHENING, SHORTENING OR ANGULAR CORRECTION, METATARSAL; OTHER THAN FIRST METATARSAL, EACH; R2; Surgeon: Morton Amy, MD; Location: [facility]; Service: Orthopedics; Laterality: Right;   OPEN REDUCTION INTERPHALANGEAL JOINT DISLOCATION Right 06/07/2016  Procedure: OPEN TREATMENT OF INTERPHALANGEAL JOINT DISLOCATION, INCLUDES INTERNAL FIXATION, WHEN PERFORMED; R2; Surgeon: Morton Amy, MD; Location: [facility]; Service: Orthopedics; Laterality: Right;   INJECTION ANESTHETIC AGENT OTHER NERVE OR BRANCH Right 06/07/2016  Procedure: INJECTION, ANESTHETIC AGENT; OTHER PERIPHERAL NERVE OR BRANCH; Surgeon: Morton Amy, MD; Location: [facility]; Service: Orthopedics; Laterality: Right;   INTRAOPERATIVE FLUOROSCOPY Right 06/07/2016  Procedure: MINI C-ARM ; Surgeon: Morton Amy, MD; Location: [facility]; Service: Orthopedics; Laterality: Right;    BREAST CYST ASPIRATION   COLONOSCOPY   KNEE  ARTHROSCOPY   Past Family History:  High blood pressure (Hypertension) Father   Coronary Artery Disease (Blocked arteries around heart) Father   Irritable bowel syndrome Father   Diabetes type II Sister   Colon cancer Sister   Aneurysm Sister   Diabetes type II Brother   Coronary Artery Disease (Blocked arteries around heart) Brother   Breast cancer Sister   Stroke Sister   Diabetes type II Sister   Medications  cephalexin (KEFLEX) 500 MG capsule Take 1 capsule (500 mg total) by mouth 2 (two) times daily for 7 days. Increase WATER intake while taking this medication.   traMADol-acetaminophen (ULTRACET) 37.5-325 mg tablet Take 0.5 tablets by mouth every 8 (eight) hours as needed.   hydrocortisone 2.5 % cream Apply topically 2 (two) times daily 30 g 1   naproxen (NAPROSYN) 500 MG tablet TAKE 1 TABLET (500 MG TOTAL) BY MOUTH TWICE A DAY WITH FOOD 60 tablet 0   tramadol HCl (TRAMADOL ORAL) Take by mouth   VITAMIN D3 ORAL Take by mouth 2000 units daily   No current Epic-ordered facility-administered medications on file.   Allergies:  Sulfa (Sulfonamide Antibiotics) - Rash, Anaphylaxis, Swelling   Sulfacetamide Sodium (Anaphylaxis and Swelling)  Review of Systems: A comprehensive 14 point ROS was performed, reviewed, and the pertinent orthopaedic findings are documented in the HPI.  Physical Exam: BP 130/88 (BP Location: Left upper arm, Patient Position: Sitting, BP Cuff Size: Large Adult)  Ht 157.5 cm (5\' 2" )  Wt 69.9 kg (154 lb)  BMI 28.17 kg/m   General: Well-developed well-nourished female seen in no acute distress.   HEENT: Atraumatic,normocephalic. Pupils are equal and reactive to light. Oropharynx is clear with moist mucosa  Lungs: Clear to auscultation bilaterally   Cardiovascular: Regular rate and rhythm. Normal S1, S2. No murmurs. No appreciable gallops or rubs. Peripheral pulses are palpable.  Abdomen: Soft,  non-tender, nondistended. Bowel sounds present  Right shoulder exam: SKIN: normal SWELLING: none WARMTH: none LYMPH NODES: no adenopathy palpable CREPITUS: none TENDERNESS: Mildly tender over anterolateral shoulder ROM (active):  Forward flexion: 145 degrees Abduction: 140 degrees Internal rotation: L1 ROM (passive):  Forward flexion: 165 degrees Abduction: 160 degrees ER/IR at 90 abd: 90 degrees / 60 degrees   She experiences mild to moderate pain with forward flexion and abduction, and mild pain at the extremes of all other motions.   STRENGTH: Forward flexion: 4-4+/5 Abduction: 4-4+/5 External rotation: 4+/5 Internal rotation: 4+-5/5 Pain with RC testing: Mild-moderate pain with resisted forward flexion and abduction, and mild pain with resisted external rotation.   STABILITY: Normal   SPECIAL TESTS: Juanetta GoslingHawkins' test: positive, mild-moderate Speed's test: negative Capsulitis - pain w/ passive ER: no Crossed arm test: Mildly positive Crank: Not evaluated Anterior apprehension: Negative Posterior apprehension: Not evaluated  Neurological: The patient is alert and oriented Sensation to light touch appears to be intact and within normal limits Gross motor strength appeared to be equal to 5/5  Vascular : Peripheral pulses felt to be palpable. Capillary refill appears to be intact and within normal limits  Right Shoulder Imaging, MRI: MRI Shoulder Cartilage: Partial thickness humeral head cartilage loss. Partial thickness glenoid cartilage loss. Small inferior humeral osteophyte MRI Shoulder Rotator Cuff: Full-thickness tears of the supraspinatus and infraspinatus tendons. Retracted to the glenohumeral joint. Moderate atrophy of the supraspinatus and infraspinatus muscles, and mild atrophy of the subscapularis muscle MRI Shoulder Labrum / Biceps: The biceps tendon appears to have torn and retracted distally. MRI Shoulder Bone: Normal bone.  Impression: 1. Rotator cuff  arthropathy right shoulder 2. Nontraumatic complete tear of right rotator cuff 3. Injury to tendon of [place]z head of bicep tendon  Plan:  The treatment options were discussed with the patient and her daughter. In addition, patient educational materials were provided regarding the diagnosis and treatment options. The patient is quite frustrated by her symptoms and functional limitations, and is ready to consider more aggressive treatment options. Given the size of the tear and its chronicity, based on the amount of atrophy of the supraspinatus and infraspinatus muscles, I feel that the likelihood of a successful outcome of an attempted rotator cuff repair is low. Therefore, I have recommended a surgical procedure, specifically a reverse right total shoulder arthroplasty. The procedure was discussed with the patient, as were the potential risks (including bleeding, infection, nerve and/or blood vessel injury, persistent or recurrent pain, loosening and/or failure of the components, dislocation, need for further surgery, blood clots, strokes, heart attacks and/or arhythmias, pneumonia, etc.) and benefits. The patient states her understanding and wishes to proceed. All of the patient's questions and concerns were answered. She can call any time with further concerns. She will follow up post-surgery, routine.   H&P reviewed and patient re-examined. No changes.

## 2022-02-15 NOTE — Anesthesia Postprocedure Evaluation (Signed)
Anesthesia Post Note  Patient: Jennifer Rubio  Procedure(s) Performed: REVERSE SHOULDER ARTHROPLASTY WITH POSSIBLE BICEPS TENODESIS (Right: Shoulder)  Patient location during evaluation: PACU Anesthesia Type: General Level of consciousness: awake and alert Pain management: pain level controlled Vital Signs Assessment: post-procedure vital signs reviewed and stable Respiratory status: spontaneous breathing, nonlabored ventilation, respiratory function stable and patient connected to nasal cannula oxygen Cardiovascular status: blood pressure returned to baseline and stable Postop Assessment: no apparent nausea or vomiting Anesthetic complications: no   No notable events documented.   Last Vitals:  Vitals:   02/15/22 1430 02/15/22 1445  BP: 137/90 126/86  Pulse: 70 64  Resp: 18 12  Temp:  (!) 36 C  SpO2: 97% 97%    Last Pain:  Vitals:   02/15/22 1445  TempSrc:   PainSc: 0-No pain                 Corinda Gubler

## 2022-02-16 ENCOUNTER — Encounter: Payer: Self-pay | Admitting: Surgery

## 2022-02-16 DIAGNOSIS — M75121 Complete rotator cuff tear or rupture of right shoulder, not specified as traumatic: Secondary | ICD-10-CM | POA: Diagnosis not present

## 2022-02-16 LAB — BASIC METABOLIC PANEL
Anion gap: 7 (ref 5–15)
BUN: 17 mg/dL (ref 8–23)
CO2: 24 mmol/L (ref 22–32)
Calcium: 9.1 mg/dL (ref 8.9–10.3)
Chloride: 103 mmol/L (ref 98–111)
Creatinine, Ser: 1.01 mg/dL — ABNORMAL HIGH (ref 0.44–1.00)
GFR, Estimated: >60 mL/min (ref >60)
Glucose, Bld: 184 mg/dL — ABNORMAL HIGH (ref 70–99)
Potassium: 4.1 mmol/L (ref 3.5–5.1)
Sodium: 134 mmol/L — ABNORMAL LOW (ref 135–145)

## 2022-02-16 LAB — CBC
HCT: 34.6 % — ABNORMAL LOW (ref 36.0–46.0)
Hemoglobin: 11 g/dL — ABNORMAL LOW (ref 12.0–15.0)
MCH: 27.6 pg (ref 26.0–34.0)
MCHC: 31.8 g/dL (ref 30.0–36.0)
MCV: 86.7 fL (ref 80.0–100.0)
Platelets: 234 10*3/uL (ref 150–400)
RBC: 3.99 MIL/uL (ref 3.87–5.11)
RDW: 13.6 % (ref 11.5–15.5)
WBC: 12.6 10*3/uL — ABNORMAL HIGH (ref 4.0–10.5)
nRBC: 0 % (ref 0.0–0.2)

## 2022-02-16 MED ORDER — ONDANSETRON HCL 4 MG PO TABS
4.0000 mg | ORAL_TABLET | Freq: Four times a day (QID) | ORAL | 0 refills | Status: AC | PRN
Start: 2022-02-16 — End: ?

## 2022-02-16 MED ORDER — ASPIRIN 325 MG PO TBEC
325.0000 mg | DELAYED_RELEASE_TABLET | Freq: Every day | ORAL | 0 refills | Status: DC
Start: 2022-02-16 — End: 2022-11-08

## 2022-02-16 MED ORDER — OXYCODONE HCL 5 MG PO TABS
ORAL_TABLET | ORAL | Status: AC
  Filled 2022-02-16: qty 1

## 2022-02-16 MED ORDER — OXYCODONE HCL 5 MG PO TABS: 5.0000 mg | ORAL_TABLET | ORAL | 0 refills | Status: DC | PRN

## 2022-02-16 MED ORDER — KETOROLAC TROMETHAMINE 15 MG/ML IJ SOLN
INTRAMUSCULAR | Status: AC
  Administered 2022-02-16: 7.5 mg via INTRAVENOUS
  Filled 2022-02-16: qty 1

## 2022-02-16 MED ORDER — ACETAMINOPHEN 500 MG PO TABS
500.0000 mg | ORAL_TABLET | Freq: Four times a day (QID) | ORAL | 0 refills | Status: AC | PRN
Start: 2022-02-16 — End: ?

## 2022-02-16 MED ORDER — ENOXAPARIN SODIUM 40 MG/0.4ML IJ SOSY
PREFILLED_SYRINGE | INTRAMUSCULAR | Status: AC
  Administered 2022-02-16: 40 mg via SUBCUTANEOUS
  Filled 2022-02-16: qty 0.4

## 2022-02-16 MED ORDER — DOCUSATE SODIUM 100 MG PO CAPS
ORAL_CAPSULE | ORAL | Status: AC
  Administered 2022-02-16: 100 mg via ORAL
  Filled 2022-02-16: qty 1

## 2022-02-16 NOTE — Evaluation (Signed)
Occupational Therapy Evaluation Patient Details Name: Jennifer Rubio MRN: [id number] DOB: 27-Jun-1951 Today's Date: 02/16/2022   History of Present Illness 71 y/o female s/p R total shoulder repalcement 5/25.   Clinical Impression   Patient was seen for an OT evaluation this date. Pt was active, independent, and working prior to surgery and is eager to return to PLOF with improved shoulder function.  Pt has orders for RUE to be immobilized and will be NWBing per MD. Patient presents with impaired strength/ROM, and sensation to RUE with block not resolved. These impairments result in a decreased ability to perform self care tasks requiring MIN assist for LB dressing and bathing, MOD A for UB bathing and dressing, and MAX A for application of polar care, compression stockings, and sling/immobilizer. Pt/dtr instructed in post-surgical recovery of shoulder replacement, sling mgt, compression stocking mgt, falls prevention, polar care mgt, adaptive strategies for ADL, RUE positioning, precautions, and ROM. Handout provided to support recall and carryover. Dtr took video/pics to support recall/carryover of instruction provided. OT adjusted sling/immobilizer and polar care to improve comfort, optimize positioning, and to maximize skin integrity/safety. Pt/dtr verbalized understanding of all education/training provided. Follow up therapy to progress rehab of shoulder per surgeon.    Recommendations for follow up therapy are one component of a multi-disciplinary discharge planning process, led by the attending physician.  Recommendations may be updated based on patient status, additional functional criteria and insurance authorization.   Follow Up Recommendations  Follow physician's recommendations for discharge plan and follow up therapies    Assistance Recommended at Discharge Intermittent Supervision/Assistance  Patient can return home with the following A little help with  bathing/dressing/bathroom;Assistance with cooking/housework;Assist for transportation;Help with stairs or ramp for entrance    Functional Status Assessment  Patient has had a recent decline in their functional status and demonstrates the ability to make significant improvements in function in a reasonable and predictable amount of time.  Equipment Recommendations  None recommended by OT    Recommendations for Other Services       Precautions / Restrictions Precautions Precautions: Shoulder Shoulder Interventions: Shoulder sling/immobilizer;Shoulder abduction pillow;At all times;Off for dressing/bathing/exercises Precaution Booklet Issued: Yes (comment) Required Braces or Orthoses: Sling Restrictions Weight Bearing Restrictions: Yes RUE Weight Bearing: Non weight bearing      Mobility Bed Mobility               General bed mobility comments: in recliner on arrival, NT    Transfers Overall transfer level: Independent Equipment used: None                      Balance Overall balance assessment: Modified Independent                                         ADL either performed or assessed with clinical judgement   ADL Overall ADL's : Needs assistance/impaired                                       General ADL Comments: Pt able to complete LB dressing with PRN MIN A, MOD A for UB dressing/grooming 2/2 RUE limitations. MAX A for polar care, sling, and compression stocking mgt. Dtr/family able to provide level of assist.     Vision  Perception     Praxis      Pertinent Vitals/Pain Pain Assessment Pain Assessment: No/denies pain     Hand Dominance     Extremity/Trunk Assessment Upper Extremity Assessment Upper Extremity Assessment: RUE deficits/detail RUE Deficits / Details: s/p shoulder replacement, nerve block in place RUE: Unable to fully assess due to immobilization RUE Sensation: decreased light  touch;decreased proprioception RUE Coordination: decreased fine motor;decreased gross motor   Lower Extremity Assessment Lower Extremity Assessment: Overall WFL for tasks assessed   Cervical / Trunk Assessment Cervical / Trunk Assessment: Normal   Communication Communication Communication: No difficulties   Cognition Arousal/Alertness: Awake/alert Behavior During Therapy: WFL for tasks assessed/performed Overall Cognitive Status: Within Functional Limits for tasks assessed                                       General Comments       Exercises Other Exercises Other Exercises: Pt/dtr instructed in post-surgical recovery of shoulder replacement, sling mgt, compression stocking mgt, falls prevention, polar care mgt, adaptive strategies for ADL, RUE positioning, precautions, and ROM. Handout provided to support recall and carryover. Dtr took video/pics to support recall/carryover of instruction provided.   Shoulder Instructions      Home Living Family/patient expects to be discharged to:: Private residence Living Arrangements: Children (daughter works, gone t/o day) Available Help at Discharge: Family;Neighbor;Friend(s);Available PRN/intermittently   Home Access: Stairs to enter Entrance Stairs-Number of Steps: 5 Entrance Stairs-Rails: Right;Left                 Home Equipment: Conservation officer, nature (2 wheels);Cane - single point          Prior Functioning/Environment Prior Level of Function : Independent/Modified Independent             Mobility Comments: no AD, drives, out of home almost daily, active in the community          OT Problem List: Decreased strength;Decreased range of motion;Impaired sensation;Impaired UE functional use      OT Treatment/Interventions:      OT Goals(Current goals can be found in the care plan section) Acute Rehab OT Goals Patient Stated Goal: go home OT Goal Formulation: All assessment and education complete, DC  therapy  OT Frequency:      Co-evaluation              AM-PAC OT "6 Clicks" Daily Activity     Outcome Measure Help from another person eating meals?: A Little Help from another person taking care of personal grooming?: A Little Help from another person toileting, which includes using toliet, bedpan, or urinal?: A Little Help from another person bathing (including washing, rinsing, drying)?: A Lot Help from another person to put on and taking off regular upper body clothing?: A Lot Help from another person to put on and taking off regular lower body clothing?: A Little 6 Click Score: 16   End of Session    Activity Tolerance: Patient tolerated treatment well Patient left: in chair;with call bell/phone within reach;with family/visitor present  OT Visit Diagnosis: Other abnormalities of gait and mobility (R26.89)                TimeES:[id number] OT Time Calculation (min): 35 min Charges:  OT General Charges $OT Visit: 1 Visit OT Evaluation $OT Eval Moderate Complexity: 1 Mod OT Treatments $Self Care/Home Management : 23-37 mins  Ardeth Perfect., MPH,  MS, OTR/L ascom [phone] 02/16/22, 10:53 AM

## 2022-02-16 NOTE — Discharge Instructions (Signed)
Diet: As you were doing prior to hospitalization   Shower:  May shower but keep the wounds dry, use an occlusive plastic wrap, NO SOAKING IN TUB.  If the bandage gets wet, change with a clean dry gauze.  Dressing:  You may change your dressing as needed. Change the dressing with sterile gauze dressing.    Activity:  Increase activity slowly as tolerated, but follow the weight bearing instructions below.  No lifting or driving for 6 weeks.  You can work on elbow motion, can come out of the sling and leave you arm at your side to do so.  Weight Bearing:   Non-weightbearing to the right arm.  Blood Clot Prevention: Take 1 325mg  aspirin daily   To prevent constipation: you may use a stool softener such as -  Colace (over the counter) 100 mg by mouth twice a day  Drink plenty of fluids (prune juice may be helpful) and high fiber foods Miralax (over the counter) for constipation as needed.    Itching:  If you experience itching with your medications, try taking only a single pain pill, or even half a pain pill at a time.  You may take up to 10 pain pills per day, and you can also use benadryl over the counter for itching or also to help with sleep.   Precautions:  If you experience chest pain or shortness of breath - call 911 immediately for transfer to the hospital emergency department!!  If you develop a fever greater that 101 F, purulent drainage from wound, increased redness or drainage from wound, or calf pain-Call [facility]                                              Follow- Up Appointment:  Please call for an appointment to be seen in 2 weeks at [facility]

## 2022-02-16 NOTE — Progress Notes (Signed)
Physical Therapy Treatment Patient Details Name: Jennifer Rubio MRN: [id number] DOB: 1951-06-13 Today's Date: 02/16/2022   History of Present Illness 71 y/o female s/p R total shoulder repalcement 5/25.    PT Comments    Pt received in handoff from OT with daughter present for session. Pt is independent for all aspects of mobility without AD on this date. Pt is able to ambulate increased distances without AD & negotiate 8 steps with 1 rail with mod I. At this point, pt is safe to d/c home from a PT perspective.    Recommendations for follow up therapy are one component of a multi-disciplinary discharge planning process, led by the attending physician.  Recommendations may be updated based on patient status, additional functional criteria and insurance authorization.  Follow Up Recommendations  Follow physician's recommendations for discharge plan and follow up therapies     Assistance Recommended at Discharge Intermittent Supervision/Assistance  Patient can return home with the following A little help with bathing/dressing/bathroom;Assistance with cooking/housework   Equipment Recommendations  None recommended by PT    Recommendations for Other Services       Precautions / Restrictions Precautions Precautions: Shoulder Shoulder Interventions: Shoulder sling/immobilizer;Shoulder abduction pillow;At all times;Off for dressing/bathing/exercises Precaution Booklet Issued: Yes (comment) Required Braces or Orthoses: Sling Restrictions Weight Bearing Restrictions: Yes RUE Weight Bearing: Non weight bearing     Mobility  Bed Mobility               General bed mobility comments: not observed, pt received & left sitting in recliner    Transfers Overall transfer level: Independent Equipment used: None               General transfer comment: STS without AD, pushes with armrest with LUE    Ambulation/Gait Ambulation/Gait assistance: Independent Gait Distance  (Feet):  (>500 ft) Assistive device: None Gait Pattern/deviations: Decreased step length - right, Decreased stride length, Decreased step length - left Gait velocity: slightly decreased         Stairs Stairs: Yes Stairs assistance: Modified independent (Device/Increase time) Stair Management: One rail Left (L ascending & L descending rail) Number of Stairs: 8     Wheelchair Mobility    Modified Rankin (Stroke Patients Only)       Balance Overall balance assessment: Modified Independent                                          Cognition Arousal/Alertness: Awake/alert Behavior During Therapy: Flat affect, WFL for tasks assessed/performed Overall Cognitive Status: Within Functional Limits for tasks assessed                                 General Comments: Very quiet throughout session.        Exercises      General Comments        Pertinent Vitals/Pain Pain Assessment Pain Assessment: No/denies pain    Home Living Family/patient expects to be discharged to:: Private residence Living Arrangements: Children (daughter works, gone t/o day) Available Help at Discharge: Family;Neighbor;Friend(s);Available PRN/intermittently   Home Access: Stairs to enter Entrance Stairs-Rails: Right;Left Entrance Stairs-Number of Steps: 5     Home Equipment: Agricultural consultant (2 wheels);Cane - single point      Prior Function            PT  Goals (current goals can now be found in the care plan section) Acute Rehab PT Goals Patient Stated Goal: go home tomorrow PT Goal Formulation: With patient/family Time For Goal Achievement: 03/01/22 Potential to Achieve Goals: Good Progress towards PT goals: Progressing toward goals    Frequency    7X/week      PT Plan Current plan remains appropriate    Co-evaluation              AM-PAC PT "6 Clicks" Mobility   Outcome Measure  Help needed turning from your back to your side while  in a flat bed without using bedrails?: None Help needed moving from lying on your back to sitting on the side of a flat bed without using bedrails?: A Little Help needed moving to and from a bed to a chair (including a wheelchair)?: None Help needed standing up from a chair using your arms (e.g., wheelchair or bedside chair)?: None Help needed to walk in hospital room?: None Help needed climbing 3-5 steps with a railing? : None 6 Click Score: 23    End of Session Equipment Utilized During Treatment:  (RUE sling) Activity Tolerance: Patient tolerated treatment well Patient left: in chair;with family/visitor present   PT Visit Diagnosis: Muscle weakness (generalized) (M62.81)     Time: 4982-6415 PT Time Calculation (min) (ACUTE ONLY): 9 min  Charges:  $Therapeutic Activity: 8-22 mins                     Aleda Grana, PT, DPT 02/16/22, 12:33 PM  Sandi Mariscal 02/16/2022, 12:32 PM

## 2022-02-16 NOTE — Discharge Summary (Signed)
Physician Discharge Summary  Patient ID: Jennifer Rubio MRN: [id number] DOB/AGE: 70-10-1950 70 y.o.  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  Discharge Diagnoses: Patient Active Problem List   Diagnosis Date Noted   Status post reverse arthroplasty of shoulder, right 02/15/2022   Muscle strain of left lower extremity 06/29/2015   Anemia 10/21/2011   UTI (lower urinary tract infection) 10/21/2011   Cellulitis of knee, left 10/19/2011   HTN (hypertension), benign 10/19/2011   Obesity 10/19/2011   Osteoarthritis 10/19/2011   Hyperglycemia 10/19/2011    Past Medical History:  Diagnosis Date   Cellulitis and abscess of leg 09/25/2011   History of 2019 novel coronavirus disease (COVID-19) 05/15/2021   History of kidney stones    Hypertension    Osteoarthritis    Urinary tract infection 09/25/2011     Transfusion: None.   Consultants (if any):   Discharged Condition: Improved  Hospital Course: Jennifer Rubio is an 71 y.o. female who was admitted 02/15/2022 with a diagnosis of a massive irreparable rotator cuff tear with early cuff arthropathy of the right shoulder and went to the operating room on 02/15/2022 and underwent the above named procedures.    Surgeries: Procedure(s): REVERSE SHOULDER ARTHROPLASTY WITH POSSIBLE BICEPS TENODESIS on 02/15/2022 Patient tolerated the surgery well. Taken to PACU where she was stabilized and then transferred to the orthopedic floor.  Started on Lovenox 40mg  q 24 hrs. Foot pumps applied bilaterally at 80 mm. Heels elevated on bed with rolled towels. No evidence of DVT. Negative Homan. Physical therapy started on day #1 for gait training and transfer. OT started day #1 for ADL and assisted devices.  Patient's IV was removed on POD1.  Implants: All press-fit  Zimmer-Biomet Comprehensive system with a 9 mm Identity micro-humeral stem, a -6 mm extended neutral Identity humeral tray with a +3 mm insert, and a Zimmer TM mini-base plate with a 20 mm post and a +3 mm lateralized eccentric 36 mm glenosphere.  She was given perioperative antibiotics:  Anti-infectives (From admission, onward)    Start     Dose/Rate Route Frequency Ordered Stop   02/15/22 2338  ceFAZolin (ANCEF) 2-4 GM/100ML-% IVPB       Note to Pharmacy: 2339 P: cabinet override      02/15/22 2338 02/15/22 2345   02/15/22 1600  ceFAZolin (ANCEF) IVPB 2g/100 mL premix        2 g 200 mL/hr over 30 Minutes Intravenous Every 6 hours 02/15/22 1508 02/16/22 0430   02/15/22 0833  ceFAZolin (ANCEF) 2-4 GM/100ML-% IVPB       Note to Pharmacy: 02/17/22 T: cabinet override      02/15/22 0833 02/15/22 1051   02/15/22 0600  ceFAZolin (ANCEF) IVPB 2g/100 mL premix        2 g 200 mL/hr over 30 Minutes Intravenous On call to O.R. 02/14/22 2326 02/15/22 1045     .  She was given sequential compression devices, early ambulation, and Lovenox for DVT prophylaxis.  She benefited maximally from the hospital stay and there were no complications.    Recent vital signs:  Vitals:   02/16/22 0348 02/16/22 0701  BP: 132/80 (!) 127/96  Pulse: 62 68  Resp: 16 18  Temp:  (!) 97.1 F (36.2 C)  SpO2: 96% 100%    Recent laboratory studies:  Lab Results  Component Value  Date   HGB 11.0 (L) 02/16/2022   HGB 11.3 (L) 02/01/2022   HGB 12.8 05/15/2021   Lab Results  Component Value Date   WBC 12.6 (H) 02/16/2022   PLT 234 02/16/2022   Lab Results  Component Value Date   INR 1.0 10/14/2013   Lab Results  Component Value Date   NA 134 (L) 02/16/2022   K 4.1 02/16/2022   CL 103 02/16/2022   CO2 24 02/16/2022   BUN 17 02/16/2022   CREATININE 1.01 (H) 02/16/2022   GLUCOSE 184 (H) 02/16/2022    Discharge Medications:   Allergies as of 02/16/2022       Reactions   Sulfa  Antibiotics Anaphylaxis, Swelling        Medication List     STOP taking these medications    traMADol-acetaminophen 37.5-325 MG tablet Commonly known as: ULTRACET       TAKE these medications    acetaminophen 500 MG tablet Commonly known as: TYLENOL Take 1-2 tablets (500-1,000 mg total) by mouth every 6 (six) hours as needed for mild pain.   aspirin EC 325 MG tablet Take 1 tablet (325 mg total) by mouth daily. What changed:  medication strength how much to take when to take this   mirabegron ER 50 MG Tb24 tablet Commonly known as: MYRBETRIQ Take 1 tablet (50 mg total) by mouth daily.   naproxen 500 MG tablet Commonly known as: NAPROSYN Take 500 mg by mouth daily as needed for pain.   ondansetron 4 MG tablet Commonly known as: ZOFRAN Take 1 tablet (4 mg total) by mouth every 6 (six) hours as needed for nausea.   oxyCODONE 5 MG immediate release tablet Commonly known as: Oxy IR/ROXICODONE Take 1-2 tablets (5-10 mg total) by mouth every 4 (four) hours as needed for moderate pain.   THERAGEN MUSCLE EX Apply 1 application. topically daily as needed (Foot pain).   Vitamin D3 50 MCG (2000 UT) Tabs Take 2,000 Units by mouth daily.        Diagnostic Studies: DG Shoulder Right Port  Result Date: 02/15/2022 CLINICAL DATA:  Total shoulder arthroplasty EXAM: RIGHT SHOULDER - 1 VIEW COMPARISON:  11/09/2021 FINDINGS: Reversed shoulder arthroplasty. Prosthetic components appear well seated. No fracture dislocation. Expected soft tissue changes post surgery. 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 Electronically Signed   By: Genevive Bi M.D.   On: 02/15/2022 13:48   [place] OR NERVE BLOCK-IMAGE ONLY [facility])  Result Date: 02/15/2022 There is no interpretation for this exam.  This order is for images obtained during a surgical procedure.  Please See "Surgeries" Tab for more information regarding the procedure.    Disposition: Plan for  discharge home today pending progress with PT.   Follow-up Information     Anson [place], PA-C. Go on 03/02/2022.   Specialty: Physician Assistant Why: at 1:15 pm, Staple Removal. Contact information: [address] [place] [address] [phone]                 Signed: Meriel Pica PA-C 02/16/2022, 11:29 AM

## 2022-02-16 NOTE — Progress Notes (Signed)
Patient is not able to walk the distance required to go the bathroom, or he/she is unable to safely negotiate stairs required to access the bathroom.  A 3in1 BSC will alleviate this problem  

## 2022-02-16 NOTE — Progress Notes (Signed)
  Subjective: 1 Day Post-Op Procedure(s) (LRB): REVERSE SHOULDER ARTHROPLASTY WITH POSSIBLE BICEPS TENODESIS (Right) Patient reports that she is not having any pain in the shoulder, is having increased low back pain from the bed and chair. Patient is well, and has had no acute complaints or problems Plan is to go Home after hospital stay. Negative for chest pain and shortness of breath Fever: no Gastrointestinal:Negative for nausea and vomiting  Objective: Vital signs in last 24 hours: Temp:  [96.8 F (36 C)-97.6 F (36.4 C)] 97.1 F (36.2 C) (05/26 0701) Pulse Rate:  [62-73] 68 (05/26 0701) Resp:  [12-21] 18 (05/26 0701) BP: (116-142)/(78-99) 127/96 (05/26 0701) SpO2:  [96 %-100 %] 100 % (05/26 0701) Weight:  [69.9 kg] 69.9 kg (05/25 0836)  Intake/Output from previous day:  Intake/Output Summary (Last 24 hours) at 02/16/2022 0757 Last data filed at 02/15/2022 1814 Gross per 24 hour  Intake 2018 ml  Output 625 ml  Net 1393 ml    Intake/Output this shift: No intake/output data recorded.  Labs: Recent Labs    02/16/22 0459  HGB 11.0*   Recent Labs    02/16/22 0459  WBC 12.6*  RBC 3.99  HCT 34.6*  PLT 234   Recent Labs    02/16/22 0459  NA 134*  K 4.1  CL 103  CO2 24  BUN 17  CREATININE 1.01*  GLUCOSE 184*  CALCIUM 9.1   No results for input(s): LABPT, INR in the last 72 hours.   EXAM General - Patient is Alert, Appropriate, and Oriented Extremity - ABD soft Incision: dressing C/D/I No cellulitis present Decreased sensation to light touch to the right shoulder and arm from recent nerve block. Dressing/Incision - clean, dry, no drainage Motor Function - intact, moving fingers well this morning. Abdomen soft with intact bowel sounds.  Past Medical History:  Diagnosis Date   Cellulitis and abscess of leg 09/25/2011   History of 2019 novel coronavirus disease (COVID-19) 05/15/2021   History of kidney stones    Hypertension    Osteoarthritis     Urinary tract infection 09/25/2011    Assessment/Plan: 1 Day Post-Op Procedure(s) (LRB): REVERSE SHOULDER ARTHROPLASTY WITH POSSIBLE BICEPS TENODESIS (Right) Principal Problem:   Status post reverse arthroplasty of shoulder, right  Estimated body mass index is 28.17 kg/m as calculated from the following:   Height as of this encounter: 5\' 2"  (1.575 m).   Weight as of this encounter: 69.9 kg. Advance diet Up with therapy D/C IV fluids when tolerating po intake.  Labs and vitals reviewed this AM, Hg 11.0, WBC 12.6. Nerve block still working this morning for the right arm. Up with therapy this morning. Plan for discharge home this afternoon pending progress with PT.  DVT Prophylaxis - Lovenox Non-weightbearing to the right arm.  Raquel Lisa-Marie Rueger, PA-C [facility] 02/16/2022, 7:57 AM

## 2022-02-20 LAB — SURGICAL PATHOLOGY

## 2022-05-31 ENCOUNTER — Other Ambulatory Visit: Payer: Self-pay | Admitting: Internal Medicine

## 2022-05-31 DIAGNOSIS — M7122 Synovial cyst of popliteal space [Baker], left knee: Secondary | ICD-10-CM

## 2022-05-31 DIAGNOSIS — I10 Essential (primary) hypertension: Secondary | ICD-10-CM

## 2022-05-31 DIAGNOSIS — G8929 Other chronic pain: Secondary | ICD-10-CM

## 2022-06-05 ENCOUNTER — Ambulatory Visit
Admission: RE | Admit: 2022-06-05 | Discharge: 2022-06-05 | Disposition: A | Discharge status: Home / Self Care | Payer: Medicare Other | Source: Ambulatory Visit | Attending: Internal Medicine | Admitting: Internal Medicine

## 2022-06-05 DIAGNOSIS — M7122 Synovial cyst of popliteal space [Baker], left knee: Secondary | ICD-10-CM

## 2022-06-05 DIAGNOSIS — G8929 Other chronic pain: Secondary | ICD-10-CM

## 2022-06-05 DIAGNOSIS — I10 Essential (primary) hypertension: Secondary | ICD-10-CM

## 2022-10-31 ENCOUNTER — Other Ambulatory Visit: Payer: Self-pay | Admitting: Podiatry

## 2022-11-08 ENCOUNTER — Encounter: Payer: Self-pay | Admitting: Podiatry

## 2022-11-13 NOTE — Discharge Instructions (Signed)
[facility]  POST OPERATIVE INSTRUCTIONS FOR DR. FOWLER AND DR. Laurel Harnden [facility]   Take your medication as prescribed.  Pain medication should be taken only as needed.  Keep the dressing clean, dry and intact.  Keep your foot elevated above the heart level for the first 48 hours.  Walking to the bathroom and brief periods of walking are acceptable, unless we have instructed you to be non-weight bearing.  Always wear your post-op shoe when walking.  Always use your crutches if you are to be non-weight bearing.  Do not take a shower. Baths are permissible as [employer] as the foot is kept out of the water.   Every hour you are awake:  Bend your knee 15 times. Flex foot 15 times Massage calf 15 times  Call [facility] ([phone]) if any of the following problems occur: You develop a temperature or fever. The bandage becomes saturated with blood. Medication does not stop your pain. Injury of the foot occurs. Any symptoms of infection including redness, odor, or red streaks running from wound. 

## 2022-11-15 ENCOUNTER — Ambulatory Visit
Admission: RE | Admit: 2022-11-15 | Discharge: 2022-11-15 | Disposition: A | Discharge status: Home / Self Care | Payer: Medicare Other | Source: Ambulatory Visit | Attending: Podiatry | Admitting: Podiatry

## 2022-11-15 ENCOUNTER — Encounter: Payer: Self-pay | Admitting: Podiatry

## 2022-11-15 ENCOUNTER — Other Ambulatory Visit: Payer: Self-pay

## 2022-11-15 ENCOUNTER — Encounter
Admission: RE | Disposition: A | Discharge status: Home / Self Care | Payer: Self-pay | Source: Ambulatory Visit | Attending: Podiatry

## 2022-11-15 ENCOUNTER — Ambulatory Visit: Payer: Medicare Other | Admitting: General Practice

## 2022-11-15 ENCOUNTER — Ambulatory Visit: Payer: Self-pay

## 2022-11-15 DIAGNOSIS — M84371A Stress fracture, right ankle, initial encounter for fracture: Secondary | ICD-10-CM | POA: Diagnosis not present

## 2022-11-15 DIAGNOSIS — I10 Essential (primary) hypertension: Secondary | ICD-10-CM | POA: Diagnosis not present

## 2022-11-15 DIAGNOSIS — X58XXXA Exposure to other specified factors, initial encounter: Secondary | ICD-10-CM | POA: Insufficient documentation

## 2022-11-15 DIAGNOSIS — M19071 Primary osteoarthritis, right ankle and foot: Secondary | ICD-10-CM | POA: Diagnosis not present

## 2022-11-15 DIAGNOSIS — M2141 Flat foot [pes planus] (acquired), right foot: Secondary | ICD-10-CM | POA: Diagnosis present

## 2022-11-15 DIAGNOSIS — E785 Hyperlipidemia, unspecified: Secondary | ICD-10-CM | POA: Insufficient documentation

## 2022-11-15 DIAGNOSIS — E669 Obesity, unspecified: Secondary | ICD-10-CM | POA: Insufficient documentation

## 2022-11-15 DIAGNOSIS — G8929 Other chronic pain: Secondary | ICD-10-CM | POA: Diagnosis not present

## 2022-11-15 DIAGNOSIS — Z6830 Body mass index (BMI) 30.0-30.9, adult: Secondary | ICD-10-CM | POA: Insufficient documentation

## 2022-11-15 DIAGNOSIS — K219 Gastro-esophageal reflux disease without esophagitis: Secondary | ICD-10-CM | POA: Diagnosis not present

## 2022-11-15 HISTORY — PX: GASTROC RECESSION EXTREMITY: SHX6262

## 2022-11-15 HISTORY — PX: ARTHRODESIS METATARSAL: SHX6565

## 2022-11-15 HISTORY — PX: FOOT ARTHRODESIS: SHX1655

## 2022-11-15 SURGERY — RECESSION, TENDON, GASTROCNEMIUS
Anesthesia: General | Site: Toe | Laterality: Right

## 2022-11-15 MED ORDER — CEFAZOLIN SODIUM-DEXTROSE 2-4 GM/100ML-% IV SOLN
2.0000 g | INTRAVENOUS | Status: AC
  Administered 2022-11-15: 2 g via INTRAVENOUS

## 2022-11-15 MED ORDER — ONDANSETRON HCL 4 MG/2ML IJ SOLN
INTRAMUSCULAR | Status: DC | PRN
  Administered 2022-11-15: 4 mg via INTRAVENOUS

## 2022-11-15 MED ORDER — LACTATED RINGERS IV SOLN: INTRAVENOUS | Status: DC

## 2022-11-15 MED ORDER — DEXAMETHASONE SODIUM PHOSPHATE 4 MG/ML IJ SOLN
INTRAMUSCULAR | Status: DC | PRN
  Administered 2022-11-15: 4 mg via INTRAVENOUS

## 2022-11-15 MED ORDER — FENTANYL CITRATE PF 50 MCG/ML IJ SOSY: 25.0000 ug | PREFILLED_SYRINGE | INTRAMUSCULAR | Status: DC | PRN

## 2022-11-15 MED ORDER — AMOXICILLIN-POT CLAVULANATE 875-125 MG PO TABS: 1.0000 | ORAL_TABLET | Freq: Two times a day (BID) | ORAL | 0 refills | Status: AC

## 2022-11-15 MED ORDER — OXYCODONE HCL 5 MG PO TABS
5.0000 mg | ORAL_TABLET | Freq: Once | ORAL | Status: AC | PRN
  Administered 2022-11-15: 5 mg via ORAL

## 2022-11-15 MED ORDER — HYDROMORPHONE HCL 1 MG/ML IJ SOLN: 0.5000 mg | INTRAMUSCULAR | Status: DC | PRN

## 2022-11-15 MED ORDER — DEXMEDETOMIDINE HCL IN NACL 200 MCG/50ML IV SOLN
INTRAVENOUS | Status: DC | PRN
  Administered 2022-11-15: 4 ug via INTRAVENOUS
  Administered 2022-11-15: 8 ug via INTRAVENOUS

## 2022-11-15 MED ORDER — ROCURONIUM BROMIDE 100 MG/10ML IV SOLN
INTRAVENOUS | Status: DC | PRN
  Administered 2022-11-15: 50 mg via INTRAVENOUS

## 2022-11-15 MED ORDER — OXYCODONE-ACETAMINOPHEN 7.5-325 MG PO TABS: 1.0000 | ORAL_TABLET | Freq: Four times a day (QID) | ORAL | 0 refills | Status: AC | PRN

## 2022-11-15 MED ORDER — ACETAMINOPHEN 10 MG/ML IV SOLN
INTRAVENOUS | Status: DC | PRN
  Administered 2022-11-15: 1000 mg via INTRAVENOUS

## 2022-11-15 MED ORDER — 0.9 % SODIUM CHLORIDE (POUR BTL) OPTIME
TOPICAL | Status: DC | PRN
  Administered 2022-11-15: 500 mL

## 2022-11-15 MED ORDER — PROPOFOL 10 MG/ML IV BOLUS
INTRAVENOUS | Status: DC | PRN
  Administered 2022-11-15: 30 mg via INTRAVENOUS
  Administered 2022-11-15: 50 mg via INTRAVENOUS
  Administered 2022-11-15: 20 mg via INTRAVENOUS
  Administered 2022-11-15: 150 mg via INTRAVENOUS
  Administered 2022-11-15: 20 mg via INTRAVENOUS

## 2022-11-15 MED ORDER — SUGAMMADEX SODIUM 200 MG/2ML IV SOLN
INTRAVENOUS | Status: DC | PRN
  Administered 2022-11-15: 200 mg via INTRAVENOUS

## 2022-11-15 MED ORDER — KETOROLAC TROMETHAMINE 15 MG/ML IJ SOLN
INTRAMUSCULAR | Status: DC | PRN
  Administered 2022-11-15: 15 mg via INTRAVENOUS

## 2022-11-15 MED ORDER — MIDAZOLAM HCL (PF) 5 MG/ML IJ SOLN
INTRAMUSCULAR | Status: DC | PRN
  Administered 2022-11-15: 2 mg via INTRAVENOUS

## 2022-11-15 MED ORDER — OXYCODONE HCL 5 MG/5ML PO SOLN: 5.0000 mg | Freq: Once | ORAL | Status: AC | PRN

## 2022-11-15 MED ORDER — ASPIRIN 81 MG PO TBEC: 81.0000 mg | DELAYED_RELEASE_TABLET | Freq: Two times a day (BID) | ORAL | 0 refills | Status: AC

## 2022-11-15 MED ORDER — BUPIVACAINE HCL (PF) 0.25 % IJ SOLN
INTRAMUSCULAR | Status: DC | PRN
  Administered 2022-11-15 (×2): 20 mL via EPIDURAL

## 2022-11-15 MED ORDER — FENTANYL CITRATE (PF) 100 MCG/2ML IJ SOLN
INTRAMUSCULAR | Status: DC | PRN
  Administered 2022-11-15: 50 ug via INTRAVENOUS

## 2022-11-15 MED ORDER — EPHEDRINE SULFATE (PRESSORS) 50 MG/ML IJ SOLN
INTRAMUSCULAR | Status: DC | PRN
  Administered 2022-11-15 (×2): 10 mg via INTRAVENOUS

## 2022-11-15 MED ORDER — FENTANYL CITRATE (PF) 100 MCG/2ML IJ SOLN
INTRAMUSCULAR | Status: DC | PRN
  Administered 2022-11-15: 100 ug via EPIDURAL

## 2022-11-15 SURGICAL SUPPLY — 73 items
1.4 WIRE IMPLANT
BIT DRILL 2 FENESTRATED (MISCELLANEOUS) IMPLANT
BIT DRILL CANN SURG 2.9X140 (DRILL) IMPLANT
BIT DRILL CANNULATED 4.6 (BIT) IMPLANT
BIT DRILL CNTRSNK MONSTER 4.5 (DRILL) IMPLANT
BIT DRILL CNTRSNK MONSTER 7.0 (DRILL) IMPLANT
BIT DRILL LNG 16X3.5XCANN (BIT) IMPLANT
BIT DRILLL 2 FENESTRATED (MISCELLANEOUS) ×3
BIT DRL LNG 16X3.5XCANN (BIT) ×3
BLADE MED AGGRESSIVE (BLADE) IMPLANT
BLADE SURG 15 STRL LF DISP TIS (BLADE) ×6 IMPLANT
BLADE SURG 15 STRL SS (BLADE) ×6
BNDG CMPR 5X4 CHSV STRCH STRL (GAUZE/BANDAGES/DRESSINGS) ×3
BNDG COHESIVE 4X5 TAN STRL LF (GAUZE/BANDAGES/DRESSINGS) ×3 IMPLANT
BNDG ESMARCH 4 X 12 STRL LF (GAUZE/BANDAGES/DRESSINGS) ×3
BNDG ESMARCH 4X12 STRL LF (GAUZE/BANDAGES/DRESSINGS) IMPLANT
BNDG GAUZE DERMACEA FLUFF 4 (GAUZE/BANDAGES/DRESSINGS) ×3 IMPLANT
BNDG GZE DERMACEA 4 6PLY (GAUZE/BANDAGES/DRESSINGS) ×3
CANISTER SUCT 1200ML W/VALVE (MISCELLANEOUS) ×3 IMPLANT
COUNTERSINK HEADLESS 5.5 (EXFIX) ×3
COVER LIGHT HANDLE UNIVERSAL (MISCELLANEOUS) ×6 IMPLANT
DRAPE C-ARM XRAY 36X54 (DRAPES) IMPLANT
DRAPE C-ARMOR (DRAPES) IMPLANT
DRAPE FLUOR MINI C-ARM 54X84 (DRAPES) ×3 IMPLANT
DRILL CANN 3.5X160 (BIT) ×3
DRILL CANN SURG 2.9X140 (DRILL) ×3
DRILL COUNTERSINK MONSTER 4.5 (DRILL) ×3
DRILL COUNTERSINK MONSTER 7.0 (DRILL) ×3
DRSG TEGADERM 4X4.75 (GAUZE/BANDAGES/DRESSINGS) IMPLANT
DRSG TELFA 4X3 1S NADH ST (GAUZE/BANDAGES/DRESSINGS) IMPLANT
DURAPREP 26ML APPLICATOR (WOUND CARE) ×3 IMPLANT
ELECT REM PT RETURN 9FT ADLT (ELECTROSURGICAL) ×3
ELECTRODE REM PT RTRN 9FT ADLT (ELECTROSURGICAL) ×3 IMPLANT
GAUZE SPONGE 4X4 12PLY STRL (GAUZE/BANDAGES/DRESSINGS) ×3 IMPLANT
GAUZE XEROFORM 1X8 LF (GAUZE/BANDAGES/DRESSINGS) ×3 IMPLANT
GLOVE BIOGEL PI IND STRL 7.5 (GLOVE) ×3 IMPLANT
GLOVE SURG SS PI 7.0 STRL IVOR (GLOVE) ×3 IMPLANT
GOWN STRL REUS W/ TWL LRG LVL3 (GOWN DISPOSABLE) ×6 IMPLANT
GOWN STRL REUS W/TWL LRG LVL3 (GOWN DISPOSABLE) ×6
GRAFT DMB PUTTY 2.5 OPTIUM FD (Putty) IMPLANT
K-WIRE SINGLE TROCAR 2.3X230 (WIRE) ×9
K-WIRE SMOOTH 1.6X150MM (WIRE) ×6
K-WIRE SMOOTH TROCAR 2.0X150 (WIRE) ×12
KIT PROCEDURE DRILL (DRILL) IMPLANT
KIT TURNOVER KIT A (KITS) ×3 IMPLANT
KWIRE SINGLE TROCAR 2.3X230 (WIRE) IMPLANT
KWIRE SMOOTH 1.6X150MM (WIRE) IMPLANT
KWIRE SMOOTH TROCAR 2.0X150 (WIRE) IMPLANT
NS IRRIG 500ML POUR BTL (IV SOLUTION) ×3 IMPLANT
PACK EXTREMITY ARMC (MISCELLANEOUS) ×3 IMPLANT
PUTTY DBM OPTIUM 2.5CC (Putty) ×3 IMPLANT
RASP SM TEAR CROSS CUT (RASP) IMPLANT
SCREW CANN HDLS ST 7X72 (Screw) IMPLANT
SCREW COUNTERSINK HEADLESS 5.5 (EXFIX) IMPLANT
SCREW HEADLESS 7.0 X 74 (Screw) IMPLANT
SCREW LONG 4.5 X 46 (Screw) IMPLANT
SCREW LONG 5.5 X 42 (Screw) IMPLANT
SCRW HEADLESS 7.0 X 74 (Screw) ×3 IMPLANT
SCRW LONG 4.5 X 46 (Screw) ×3 IMPLANT
SCRW LONG 5.5 X 42 (Screw) ×3 IMPLANT
SPLINT CAST 1 STEP 4X30 (MISCELLANEOUS) ×3 IMPLANT
STAPLE BONE 20X22X22 STRL (Staple) IMPLANT
STAPLER SKIN PROX 35W (STAPLE) IMPLANT
STIMULATOR BONE (ORTHOPEDIC SUPPLIES) ×3
STIMULATOR BONE GROWTH EMG EXT (ORTHOPEDIC SUPPLIES) IMPLANT
STOCKINETTE IMPERVIOUS LG (DRAPES) ×3 IMPLANT
SUT ETHILON 3-0 (SUTURE) IMPLANT
SUT ETHILON 4-0 (SUTURE)
SUT ETHILON 4-0 FS2 18XMFL BLK (SUTURE)
SUT VIC AB 3-0 SH 27 (SUTURE) ×9
SUT VIC AB 3-0 SH 27X BRD (SUTURE) IMPLANT
SUT VIC AB 4-0 FS2 27 (SUTURE) IMPLANT
SUTURE ETHLN 4-0 FS2 18XMF BLK (SUTURE) IMPLANT

## 2022-11-15 NOTE — Anesthesia Postprocedure Evaluation (Signed)
Anesthesia Post Note  Patient: Jennifer Rubio  Procedure(s) Performed: GASTROC RECESSION EXTREMITY (Right: Leg Lower) ARTHRODESIS FOOT (Right: Foot) ARTHRODESIS METATARSAL (Right: Toe)  Patient location during evaluation: PACU Anesthesia Type: General Level of consciousness: awake and alert Pain management: pain level controlled Vital Signs Assessment: post-procedure vital signs reviewed and stable Respiratory status: spontaneous breathing, nonlabored ventilation, respiratory function stable and patient connected to nasal cannula oxygen Cardiovascular status: blood pressure returned to baseline and stable Postop Assessment: no apparent nausea or vomiting Anesthetic complications: no  No notable events documented.   Last Vitals:  Vitals:   11/15/22 1200 11/15/22 1215  BP: 113/68 115/65  Pulse: 94 91  Resp: 20 (!) 27  Temp: 36.7 C   SpO2: 97% 96%    Last Pain:  Vitals:   11/15/22 1215  TempSrc:   PainSc: [place]

## 2022-11-15 NOTE — Anesthesia Procedure Notes (Signed)
Anesthesia Regional Block: Adductor canal block   Pre-Anesthetic Checklist: , timeout performed,  Correct Patient, Correct Site, Correct Laterality,  Correct Procedure, Correct Position, site marked,  Risks and benefits discussed,  Surgical consent,  Pre-op evaluation,  At surgeon's request and post-op pain management  Laterality: Lower  Prep: chloraprep       Needles:  Injection technique: Single-shot  Needle Type: Echogenic Needle     Needle Length: 9cm  Needle Gauge: 21     Additional Needles:   Procedures:,,,, ultrasound used (permanent image in chart),,    Narrative:  Start time: 11/15/2022 7:20 AM End time: 11/15/2022 7:25 AM Injection made incrementally with aspirations every 5 mL.  Performed by: Personally  Anesthesiologist: Dimas Millin, MD  Additional Notes: Patient's chart reviewed and they were deemed appropriate candidate for procedure, per surgeon's request. Patient educated about risks, benefits, and alternatives of the block including but not limited to: temporary or permanent nerve damage, bleeding, infection, damage to surround tissues, block failure, local anesthetic toxicity. Patient expressed understanding. A formal time-out was conducted consistent with institution rules.  Monitors were applied, and minimal sedation used (see nursing record). The site was prepped with skin prep and allowed to dry, and sterile gloves were used. A high frequency linear ultrasound probe with probe cover was utilized throughout. Femoral artery visualized at mid-thigh level, local anesthetic injected anterolateral to it, and echogenic block needle trajectory was monitored throughout. Hydrodissection of saphenous nerve visualized and appeared anatomically normal. Aspiration performed every 40m. Blood vessels were avoided. All injections were performed without resistance and free of blood and paresthesias. The patient tolerated the procedure well. A picture of the nerve block was  added to the patient's chart.  Injectate: 20cc of 0.25% bupivacaine

## 2022-11-15 NOTE — Transfer of Care (Signed)
Immediate Anesthesia Transfer of Care Note  Patient: Jennifer Rubio  Procedure(s) Performed: GASTROC RECESSION EXTREMITY (Right: Leg Lower) ARTHRODESIS FOOT (Right: Foot) ARTHRODESIS METATARSAL (Right: Toe)  Patient Location: PACU  Anesthesia Type: General ETT  Level of Consciousness: awake, alert  and patient cooperative  Airway and Oxygen Therapy: Patient Spontanous Breathing and Patient connected to supplemental oxygen  Post-op Assessment: Post-op Vital signs reviewed, Patient's Cardiovascular Status Stable, Respiratory Function Stable, Patent Airway and No signs of Nausea or vomiting  Post-op Vital Signs: Reviewed and stable  Complications: No notable events documented.

## 2022-11-15 NOTE — Anesthesia Procedure Notes (Signed)
Anesthesia Regional Block: Popliteal block   Pre-Anesthetic Checklist: , timeout performed,  Correct Patient, Correct Site, Correct Laterality,  Correct Procedure, Correct Position, site marked,  Risks and benefits discussed,  Surgical consent,  Pre-op evaluation,  At surgeon's request and post-op pain management  Laterality: Lower  Prep: chloraprep       Needles:  Injection technique: Single-shot  Needle Type: Echogenic Needle     Needle Length: 9cm  Needle Gauge: 21     Additional Needles:   Procedures:,,,, ultrasound used (permanent image in chart),,    Narrative:  Start time: 11/15/2022 7:30 AM End time: 11/15/2022 7:35 AM Injection made incrementally with aspirations every 5 mL.  Performed by: Personally  Anesthesiologist: Dimas Millin, MD  Additional Notes: Patient's chart reviewed and they were deemed appropriate candidate for procedure, at surgeon's request. Patient educated about risks, benefits, and alternatives of the block including but not limited to: temporary or permanent nerve damage, bleeding, infection, damage to surround tissues, block failure, local anesthetic toxicity. Patient expressed understanding. A formal time-out was conducted consistent with institution rules.  Monitors were applied, and minimal sedation used. The site was prepped with skin prep and allowed to dry, and sterile gloves were used. A high frequency linear ultrasound probe with probe cover was utilized throughout. Popliteal artery pulsatile and visualized in popliteal fossa along with adjacent sciatic nerve and its branch point, which appeared anatomically normal, local anesthetic injected around them just proximal to the branch point, and echogenic block needle trajectory was monitored throughout. Aspiration performed every 83m. Blood vessels were avoided. All injections were performed without resistance and free of blood and paresthesias. The patient tolerated the procedure well. A picture  of the nerve block was added to the patient's chart.  Injectate: 20cc of 0.25% bupivacaine

## 2022-11-15 NOTE — Anesthesia Procedure Notes (Signed)
Procedure Name: Intubation Date/Time: 11/15/2022 7:50 AM  Performed by: Tobie Poet, CRNAPre-anesthesia Checklist: Patient identified, Emergency Drugs available, Suction available and Patient being monitored Patient Re-evaluated:Patient Re-evaluated prior to induction Oxygen Delivery Method: Circle system utilized Preoxygenation: Pre-oxygenation with 100% oxygen Induction Type: IV induction Ventilation: Mask ventilation without difficulty Laryngoscope Size: Mac and 3 Tube type: Oral Tube size: 7.0 mm Number of attempts: 1 Airway Equipment and Method: Stylet and Oral airway Placement Confirmation: ETT inserted through vocal cords under direct vision, positive ETCO2 and breath sounds checked- equal and bilateral Secured at: 20 cm Tube secured with: Tape Dental Injury: Teeth and Oropharynx as per pre-operative assessment

## 2022-11-15 NOTE — H&P (Signed)
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  The various methods of treatment have been discussed with the patient.  No guarantees were given.  After consideration of risks, benefits and other options for treatment, the patient has consented to surgery.  I have reviewed the patients' chart and labs.    PROCEDURE: ALL RIGHT FOOT/LEG GASTROC RECESSION SUBTALAR JOINT ARTHRODESIS TALONAVICULAR JOINT ARTHRODESIS  A history and physical examination was performed in my office.  The patient was reexamined.  There have been no changes to this history and physical examination.  Caroline More, DPM

## 2022-11-15 NOTE — Anesthesia Preprocedure Evaluation (Addendum)
Anesthesia Evaluation  Patient identified by MRN, date of birth, ID band Patient awake    Reviewed: Allergy & Precautions, NPO status , Patient's Chart, lab work & pertinent test results  History of Anesthesia Complications Negative for: history of anesthetic complications  Airway Mallampati: III  TM Distance: >3 FB Neck ROM: full    Dental no notable dental hx. (+) Chipped, Dental Advidsory Given   Pulmonary neg pulmonary ROS, neg sleep apnea, neg COPD, Patient abstained from smoking.Not current smoker   Pulmonary exam normal breath sounds clear to auscultation       Cardiovascular Exercise Tolerance: Good METShypertension, Pt. on medications (-) CAD and (-) Past MI Normal cardiovascular exam(-) dysrhythmias  Rhythm:Regular Rate:Normal - Systolic murmurs    Neuro/Psych negative neurological ROS  negative psych ROS   GI/Hepatic negative GI ROS, Neg liver ROS,neg GERD  ,,  Endo/Other  negative endocrine ROSneg diabetes    Renal/GU negative Renal ROS     Musculoskeletal  (+) Arthritis ,    Abdominal   Peds  Hematology  (+) Blood dyscrasia, anemia   Anesthesia Other Findings Past Medical History: 09/25/2011: Cellulitis and abscess of leg 05/15/2021: History of 2019 novel coronavirus disease (COVID-19) No date: History of kidney stones No date: Hypertension No date: Osteoarthritis 09/25/2011: Urinary tract infection  Past Surgical History: No date: ABDOMINAL HYSTERECTOMY 11/26/2011: BLADDER SUSPENSION     Comment:  Procedure: TRANSVAGINAL TAPE (TVT) PROCEDURE;  Surgeon:               Princess Bruins, MD;  Location: [facility];  Service:               Gynecology;  Laterality: N/A; No date: BREAST CYST ASPIRATION; Right No date: BUNIONECTOMY 11/26/2011: CYSTOSCOPY     Comment:  Procedure: CYSTOSCOPY;  Surgeon: Princess Bruins, MD;               Location: [facility];  Service: Gynecology;  Laterality: N/A; No date:  KNEE ARTHROSCOPY     Comment:  both knees 11/26/2011: RECTOCELE REPAIR     Comment:  Procedure: POSTERIOR REPAIR (RECTOCELE);  Surgeon:               Princess Bruins, MD;  Location: [facility];  Service:               Gynecology;  Laterality: N/A; 02/15/2022: REVERSE SHOULDER ARTHROPLASTY; Right     Comment:  Procedure: REVERSE SHOULDER ARTHROPLASTY WITH POSSIBLE               BICEPS TENODESIS;  Surgeon: Corky Mull, MD;  Location:              [facility];  Service: Orthopedics;  Laterality: Right; 11/26/2011: ROBOTIC ASSISTED LAPAROSCOPIC SACROCOLPOPEXY     Comment:  Procedure: ROBOTIC ASSISTED LAPAROSCOPIC SACROCOLPOPEXY;              Surgeon: Princess Bruins, MD;  Location: [facility];                Service: Gynecology;  Laterality: N/A;  BMI    Body Mass Index: 26.34 kg/m      Reproductive/Obstetrics negative OB ROS                             Anesthesia Physical Anesthesia Plan  ASA: 2  Anesthesia Plan: General ETT   Post-op Pain Management: Regional block and Ofirmev IV (intra-op)   Induction: Intravenous  PONV Risk  Score and Plan: 3 and Midazolam, Ondansetron and Dexamethasone  Airway Management Planned: Oral ETT  Additional Equipment: None  Intra-op Plan:   Post-operative Plan: Extubation in OR  Informed Consent: I have reviewed the patients History and Physical, chart, labs and discussed the procedure including the risks, benefits and alternatives for the proposed anesthesia with the patient or authorized representative who has indicated his/her understanding and acceptance.     Dental Advisory Given  Plan Discussed with: Anesthesiologist, CRNA and Surgeon  Anesthesia Plan Comments: (Patient consented for risks of anesthesia including but not limited to:  - adverse reactions to medications - damage to eyes, teeth, lips or other oral mucosa - nerve damage due to positioning  - sore throat or hoarseness - Damage to heart, brain, nerves,  lungs, other parts of body or loss of life  Patient voiced understanding.)        Anesthesia Quick Evaluation

## 2022-11-15 NOTE — Op Note (Signed)
PODIATRY / FOOT AND ANKLE SURGERY OPERATIVE REPORT    SURGEON: Caroline More, DPM  PRE-OPERATIVE DIAGNOSIS:  1.  Right pes planus 2.  Right lower extremity equinus 3.  Right talonavicular stress reaction/stress fracture, chronic  POST-OPERATIVE DIAGNOSIS: Same  PROCEDURE(S): Right gastroc recession Right talonavicular joint fusion Right subtalar joint fusion  HEMOSTASIS: Right thigh tourniquet  ANESTHESIA: general  ESTIMATED BLOOD LOSS: 40 cc  FINDING(S): 1.  Cystic changes present to the talar head with some mild cartilage damage to the talonavicular joint.  Pes planus foot structure.  PATHOLOGY/SPECIMEN(S): None  INDICATIONS:   Jennifer Rubio is a 72 y.o. female who presents with chronic pain to the right medial ankle around the talonavicular joint.  Patient has exhausted conservative measures consisting of changes in shoe gear, bracing, padding, strapping, orthoses, steroid injections and physical therapy.  An MRI as well as x-ray imaging was taken which showed a mild to moderate pes planus foot structure.  The MRI also showed substantial bone marrow edema in the talar head likely related to increased stresses to the area due to pes planus foot structure.  All treatment options were discussed with the patient and patient's family both conservative and surgical attempts at correction include potential risks and complications at this time patient is elected for surgical procedure consisting of right gastroc recession, talonavicular joint and subtalar joint fusions.  No guarantees given.  All questions answered.  Patient remain nonweightbearing for 6 to 8 weeks after surgery.  Consent obtained prior to procedure.  DESCRIPTION: After obtaining full informed written consent, the patient was brought back to the operating room and placed supine upon the operating table.  A preoperative block was performed by anesthesia.  The patient received IV antibiotics prior to induction.  After  obtaining adequate anesthesia, the patient was prepped and draped in the standard fashion.  An Esmarch bandage was used to exsanguinate the right lower extremity and the pneumatic thigh tourniquet was inflated.  Attention was directed to the posterior aspect of the right lower leg.  A linear longitudinal incision was made slightly distal to the myotendinous junction of the gastroc.  At this time dissection was continued through the subcutaneous tissues utilizing sharp blunt dissection care was taken to identify and retract all neurovascular structures and all venous contributories were cauterized.  The saphenous vein and sural nerve were identified and retracted throughout the remainder of the case.  At this time an incision was made into the deep fascia.  The fascia was then retracted and the gastroc aponeurosis was identified along with the plantaris tendon.  The gastroc aponeurosis was resected as well as the plantaris tendon.  There appeared to be excellent dorsiflexion of the ankle joint to approximately 10 degrees with the knee extended within normal limits.  The surgical site was flushed with copious amounts normal sterile saline.  At this time the deep fascia was reapproximated well coapted with 3-0 Vicryl.  The subcutaneous tissue was reapproximated well coapted with 3-0 Vicryl.  The skin was then reapproximated well coapted with skin staples.  A dressing was applied consisting of Xeroform to the area followed by Tegaderm type dressing with Telfa.  Attention was then directed to the fibula area where an incision was made starting from the distal tip of the fibula over the subtalar joint to the calcaneocuboid joint area.  The incision was deepened through the subcutaneous tissues utilizing sharp blunt dissection and care was taken to identify and retract all vital neurovascular structures and all  venous contributories were cauterized as necessary.  At this time an incision was made into the anterior  process of the calcaneus reflecting the extensor digitorum brevis muscle belly and elevating this area.  At this time the sinus tarsi was evaluated and the subtalar joint was able to be visualized.  The talocalcaneal interosseous ligament was released.  The joint was then inspected.  The joint appeared to be fairly hypermobile.  There appeared to be minimal cartilage damage to the area but did appear To have some cystic changes centrally in the joint.  The joint distractor was then applied with 2 pins.  The joint was then prepped utilizing curettage with curettes, osteotomes, and shaver.  The joint surfaces were resected of all cartilage at both the calcaneus and talar sides.  The joint was flushed with copious amounts normal sterile saline.  The joint was then prepped further with fenestration and fish scaling.  The joint was then packed with DBM.  The joint distractor and pins were removed.  Attention was then directed to the talonavicular joint area where a linear longitudinal incision was made between the interval of the tibialis anterior and tibialis posterior.  This incision was made as far dorsal as possible in this area for visualization of the joint.  The incision was deepened to the subcutaneous tissues utilizing sharp blunt dissection care was taken to identify and retract all vital neurovascular structures and all venous contributories were cauterized necessary.  The saphenous nerve and vein were retracted throughout the remainder the case.  At this time a capsular incision was then made into the talonavicular joint between the tibialis anterior and the tibialis posterior.  The capsular and periosteal tissue was reflected medially and laterally thereby exposing the talonavicular joint at the operative site.  A minimal amount of the posterior tibial tendon was reflected from the navicular tuberosity area.  A curved osteotome was used to open up the joint further removing dorsal capsular tissues and  freeing up the joint further.  The pin distractor was then applied again and the joint was then opened to assess the area further.  There appeared to be a fair amount of cartilage damage and cystic changes present to the medial and central aspects of the talar head.  The bone appeared to be fairly soft in this area.  The joint also appeared to be inflamed when it was opened and appeared to have a fair amount of synovitic tissue present.  All the articular cartilage from the talar head and navicular were resected with combination of osteotome, curette.  The joint was flushed with copious amounts normal sterile saline.  The joint was then prepped further with fenestration and fish scaling.  The joint was packed also with DBM.  While holding the rear foot in a rectus position to slight valgus the talonavicular joint was pinned while reducing the talonavicular deformity.  The forefoot and midfoot were rotated into the appropriate position and the talar head appeared to be completely covered and appeared to have a fairly rectus forefoot and midfoot along with rear foot.  The TN joint was pinned in this position.  Attention was then directed to the rear foot area on the calcaneus.  The heel was held in a rectus position such that the heel is in alignment with the tibia in a slight valgus position.  The large C arm was used to place wires across the subtalar joint starting from the posterior tuber of the calcaneus across the subtalar joint and  into the talus with the appropriate orientation.  C-arm imaging was used in multiple planes to place 2 parallel wires and was checked on the axial calcaneal view, AP ankle, and lateral views.  The wires appeared to sit in the appropriate position overall and excellent stabilization appear to be obtained across the subtalar joint.  At this time small stab incisions were made around the wires through the posterior plantar heel.  Dissection was continued down to bone with a hemostat  with blunt dissection.  At this time utilizing standard principles of AO technique to 7.0 cannulated partially-threaded headless compression screws and Paragon 28 were placed across the subtalar joint in a parallel fashion through the posterior facet.  Excellent compression was noted across the area.  C-arm imaging was once again utilized to verify correct position of screws which appeared to be excellent.  The screws appeared to be the appropriate length and did not appear to have any prominence to the posterior plantar heel.  At this time the pneumatic thigh tourniquet was deflated and a prompt hyperemic response was noted to all digits of the right foot.  The subtalar joint was assessed further through the sinus tarsi type of incision.  It appeared to be well compressed overall.  The surgical site in this area was flushed with copious amounts normal sterile saline.  The deep fascia and capsular tissues were reapproximated well coapted with 3-0 Vicryl along with the extensor digitorum brevis muscle belly.  The subcutaneous tissue was also reapproximated well coapted with 3-0 Vicryl.  The skin was then reapproximated well coapted with 3-0 nylon in horizontal mattress type stitching.  The posterior/plantar heel incision was then reapproximated well coapted with a combination of 3-0 nylon and skin staples.  After the pneumatic thigh tourniquet had been down for around 25 minutes the Esmarch bandage was used to exsanguinate the right lower extremity and pneumatic thigh tourniquet was inflated once again.  Attention was then directed back to the talonavicular joint.  Reduction was assessed once again of the talonavicular joint and the forefoot/midfoot compared to the right foot.  All appear to be in a rectus position still at this time.  At this time a guidewire for a 5.5 cannulated Paragon 28 screw was then placed through the distal medial aspect of the navicular across the talonavicular joint with the appropriate  orientation into the talus.  This was checked under multiple views under fluoroscopic guidance.  Utilizing standard AO principles and techniques a 5.5 headed cannulated Paragon 28 screw partially-threaded was placed across the talonavicular joint with excellent compression noted.  Attention was then directed to the dorsal aspect of the talonavicular joint where a Paragon 28 20 mm staple was placed utilizing standard AO principles and techniques and excellent compression was noted across the talonavicular joint.  This was also performed under fluoroscopic guidance and compression appeared to be excellent overall.  There still appeared to be a little bit of gapping to the lateral aspect of the talus at the lateral talonavicular joint area.  At this time a percutaneous wire was then placed through the lateral aspect of the navicular across the TN navicular joint and into the talus under fluoroscopic guidance.  A small stab incision was made around the wire.  At this time utilizing standard principles of a techniques a 4.5 partially-threaded headed cannulated screw was placed across the talonavicular joint through this area as well with the appropriate orientation.  Excellent compression was noted throughout the joint with screws and stable  the appropriate size and length.  Final C-arm imaging was taken showing fairly rectus position of rear foot and talonavicular joint as well as midfoot and forefoot.  Screws and staple appeared to be all the appropriate size and length and excellent compression was noted across the talonavicular joint and subtalar joint.  The surgical site was flushed with copious amounts normal sterile saline.  The capsular periosteal tissues were reapproximated well coapted along with subcutaneous tissue with 3-0 Vicryl.  The skin was then reapproximated well coapted with combination of skin stapler and 3-0 nylon.  The pneumatic thigh tourniquet was deflated and a prompt hyperemic response was  noted to all digits of the right foot.  Postoperative dressing was applied consisting of Xeroform to the incisions followed by 4 x 4 gauze, ABD, Kerlix, Webril, posterior splint, Ace wrap.  The patient tolerated the procedure and anesthesia well was transferred to recovery room vital signs stable vascular status intact all toes the right foot.  Following period of postoperative monitoring the patient be discharged home with the appropriate orders, instructions, and medications.  Patient is to remain nonweightbearing at all times to the right lower extremity.  Patient also given bone stimulator today and instructed patient and family on usage.  Bone stimulator applied and turned on today at discharge.  Patient to follow-up in clinic 1 week after surgical date for further evaluation.   COMPLICATIONS: None  CONDITION: Good, stable  Caroline More, DPM

## 2022-11-15 NOTE — Progress Notes (Signed)
[facility] with right, popliteal/saphenous, ultrasound guided block. Side rails up, monitors on throughout procedure. See vital signs in flow sheet. Tolerated Procedure well.

## 2022-11-19 ENCOUNTER — Encounter: Payer: Self-pay | Admitting: Podiatry

## 2022-12-11 ENCOUNTER — Encounter: Payer: Self-pay | Admitting: Podiatry

## 2023-01-03 ENCOUNTER — Other Ambulatory Visit: Payer: Self-pay | Admitting: Internal Medicine

## 2023-01-03 DIAGNOSIS — Z1231 Encounter for screening mammogram for malignant neoplasm of breast: Secondary | ICD-10-CM

## 2023-01-24 ENCOUNTER — Ambulatory Visit
Admission: RE | Admit: 2023-01-24 | Discharge: 2023-01-24 | Disposition: A | Discharge status: Home / Self Care | Payer: Medicare Other | Source: Ambulatory Visit | Attending: Internal Medicine | Admitting: Internal Medicine

## 2023-01-24 DIAGNOSIS — Z1231 Encounter for screening mammogram for malignant neoplasm of breast: Secondary | ICD-10-CM | POA: Insufficient documentation

## 2023-05-15 ENCOUNTER — Ambulatory Visit: Payer: Medicare Other

## 2023-05-15 DIAGNOSIS — K6389 Other specified diseases of intestine: Secondary | ICD-10-CM | POA: Diagnosis not present

## 2023-05-15 DIAGNOSIS — K5730 Diverticulosis of large intestine without perforation or abscess without bleeding: Secondary | ICD-10-CM | POA: Diagnosis not present

## 2023-05-15 DIAGNOSIS — D124 Benign neoplasm of descending colon: Secondary | ICD-10-CM | POA: Diagnosis not present

## 2023-05-15 DIAGNOSIS — Z1211 Encounter for screening for malignant neoplasm of colon: Secondary | ICD-10-CM | POA: Diagnosis present

## 2023-05-15 DIAGNOSIS — D120 Benign neoplasm of cecum: Secondary | ICD-10-CM | POA: Diagnosis not present

## 2023-07-08 ENCOUNTER — Emergency Department: Payer: Medicare Other

## 2023-07-08 ENCOUNTER — Emergency Department
Admission: EM | Admit: 2023-07-08 | Discharge: 2023-07-08 | Disposition: A | Discharge status: Home / Self Care | Payer: Medicare Other | Attending: Emergency Medicine | Admitting: Emergency Medicine

## 2023-07-08 ENCOUNTER — Other Ambulatory Visit: Payer: Self-pay

## 2023-07-08 DIAGNOSIS — H1132 Conjunctival hemorrhage, left eye: Secondary | ICD-10-CM | POA: Insufficient documentation

## 2023-07-08 DIAGNOSIS — Z96611 Presence of right artificial shoulder joint: Secondary | ICD-10-CM | POA: Diagnosis not present

## 2023-07-08 DIAGNOSIS — G8929 Other chronic pain: Secondary | ICD-10-CM

## 2023-07-08 DIAGNOSIS — I10 Essential (primary) hypertension: Secondary | ICD-10-CM | POA: Insufficient documentation

## 2023-07-08 DIAGNOSIS — R519 Headache, unspecified: Secondary | ICD-10-CM | POA: Diagnosis present

## 2023-07-08 LAB — BASIC METABOLIC PANEL WITH GFR
Anion gap: 8 (ref 5–15)
BUN: 14 mg/dL (ref 8–23)
CO2: 23 mmol/L (ref 22–32)
Calcium: 8.7 mg/dL — ABNORMAL LOW (ref 8.9–10.3)
Chloride: 106 mmol/L (ref 98–111)
Creatinine, Ser: 0.68 mg/dL (ref 0.44–1.00)
GFR, Estimated: >60 mL/min
Glucose, Bld: 97 mg/dL (ref 70–99)
Potassium: 3.8 mmol/L (ref 3.5–5.1)
Sodium: 137 mmol/L (ref 135–145)

## 2023-07-08 LAB — CBC WITH DIFFERENTIAL/PLATELET
Abs Immature Granulocytes: 0.01 K/uL (ref 0.00–0.07)
Basophils Absolute: 0 K/uL (ref 0.0–0.1)
Basophils Relative: 1 %
Eosinophils Absolute: 0.1 K/uL (ref 0.0–0.5)
Eosinophils Relative: 2 %
HCT: 36.9 % (ref 36.0–46.0)
Hemoglobin: 12.1 g/dL (ref 12.0–15.0)
Immature Granulocytes: 0 %
Lymphocytes Relative: 44 %
Lymphs Abs: 1.7 K/uL (ref 0.7–4.0)
MCH: 27.6 pg (ref 26.0–34.0)
MCHC: 32.8 g/dL (ref 30.0–36.0)
MCV: 84.1 fL (ref 80.0–100.0)
Monocytes Absolute: 0.3 K/uL (ref 0.1–1.0)
Monocytes Relative: 8 %
Neutro Abs: 1.8 K/uL (ref 1.7–7.7)
Neutrophils Relative %: 45 %
Platelets: 244 K/uL (ref 150–400)
RBC: 4.39 MIL/uL (ref 3.87–5.11)
RDW: 14 % (ref 11.5–15.5)
WBC: 3.9 K/uL — ABNORMAL LOW (ref 4.0–10.5)
nRBC: 0 % (ref 0.0–0.2)

## 2023-07-08 MED ORDER — TETRACAINE HCL 0.5 % OP SOLN
2.0000 [drp] | Freq: Once | OPHTHALMIC | Status: AC
  Administered 2023-07-08: 2 [drp] via OPHTHALMIC
  Filled 2023-07-08: qty 4

## 2023-07-08 MED ORDER — ACETAMINOPHEN 500 MG PO TABS
1000.0000 mg | ORAL_TABLET | Freq: Once | ORAL | Status: AC
  Administered 2023-07-08: 1000 mg via ORAL
  Filled 2023-07-08: qty 2

## 2023-07-08 MED ORDER — FLUORESCEIN SODIUM 1 MG OP STRP
1.0000 | ORAL_STRIP | Freq: Once | OPHTHALMIC | Status: AC
  Administered 2023-07-08: 1 via OPHTHALMIC
  Filled 2023-07-08: qty 1

## 2023-07-08 NOTE — ED Provider Notes (Signed)
-----------------------------------------   7:31 AM on 07/08/2023 ----------------------------------------- Patient care assumed from Dr. Elesa Massed.  Patient's workup is essentially reassuring with a normal CBC reassuring chemistry and a reassuring CT scan of the head.  Given the patient's reassuring workup we will discharge home with outpatient follow-up.  Patient agreeable to plan of care.  Provided my typical headache return precautions.   Minna Antis, MD 07/08/23 [phone]

## 2023-07-08 NOTE — ED Triage Notes (Signed)
Pt states she went to sleep last night with a headache and woke up this morning with her left eye itching and red. Pt denies vision changes, pt denies numbness weakness or dizziness. Speech clear, pt ambulatory and acting at baseline.

## 2023-07-08 NOTE — ED Provider Notes (Signed)
[facility] Provider Note    Event Date/Time   First MD Initiated Contact with Patient 07/08/23 0522     (approximate)   History   Eye Problem   HPI  Jennifer Rubio is a 72 y.o. female history of hypertension he was taken off of her medications for low blood pressure who presents to the emergency department with several months of intermittent headaches that resolved with Tylenol at home and today noticing that her left eye was "irritated" when she woke up.  Patient has a subconjunctival hemorrhage to the left eye.  Denies any head trauma, eye injury.  No vision changes.  Wears glasses.  Denies any eye pain, numbness, tingling, weakness.  Has not seen her PCP for these headaches.  Her blood pressure is elevated in the ED at 167/110.  Family reports it was elevated during her last PCP visit as well.   History provided by patient, family.    Past Medical History:  Diagnosis Date   Cellulitis and abscess of leg 09/25/2011   History of 2019 novel coronavirus disease (COVID-19) 05/15/2021   History of kidney stones    Hypertension    Osteoarthritis    Urinary tract infection 09/25/2011    Past Surgical History:  Procedure Laterality Date   ABDOMINAL HYSTERECTOMY     ARTHRODESIS METATARSAL Right 11/15/2022   Procedure: ARTHRODESIS METATARSAL;  Surgeon: Rosetta Posner, DPM;  Location: [facility];  Service: Podiatry;  Laterality: Right;   BLADDER SUSPENSION  11/26/2011   Procedure: TRANSVAGINAL TAPE (TVT) PROCEDURE;  Surgeon: Genia Del, MD;  Location: [facility];  Service: Gynecology;  Laterality: N/A;   BREAST CYST ASPIRATION Right    BUNIONECTOMY     CYSTOSCOPY  11/26/2011   Procedure: CYSTOSCOPY;  Surgeon: Genia Del, MD;  Location: [facility];  Service: Gynecology;  Laterality: N/A;   FOOT ARTHRODESIS Right 11/15/2022   Procedure: ARTHRODESIS FOOT;  Surgeon: Rosetta Posner, DPM;  Location: [facility];  Service: Podiatry;  Laterality:  Right;   GASTROC RECESSION EXTREMITY Right 11/15/2022   Procedure: GASTROC RECESSION EXTREMITY;  Surgeon: Rosetta Posner, DPM;  Location: [facility];  Service: Podiatry;  Laterality: Right;   KNEE ARTHROSCOPY     both knees   RECTOCELE REPAIR  11/26/2011   Procedure: POSTERIOR REPAIR (RECTOCELE);  Surgeon: Genia Del, MD;  Location: [facility];  Service: Gynecology;  Laterality: N/A;   REVERSE SHOULDER ARTHROPLASTY Right 02/15/2022   Procedure: REVERSE SHOULDER ARTHROPLASTY WITH POSSIBLE BICEPS TENODESIS;  Surgeon: Christena Flake, MD;  Location: [facility];  Service: Orthopedics;  Laterality: Right;   ROBOTIC ASSISTED LAPAROSCOPIC SACROCOLPOPEXY  11/26/2011   Procedure: ROBOTIC ASSISTED LAPAROSCOPIC SACROCOLPOPEXY;  Surgeon: Genia Del, MD;  Location: [facility];  Service: Gynecology;  Laterality: N/A;    MEDICATIONS:  Prior to Admission medications   Medication Sig Start Date End Date Taking? Authorizing Provider  acetaminophen (TYLENOL) 500 MG tablet Take 1-2 tablets (500-1,000 mg total) by mouth every 6 (six) hours as needed for mild pain. 02/16/22   Anson Oregon, PA-C  amoxicillin-clavulanate (AUGMENTIN) 875-125 MG tablet Take 1 tablet by mouth 2 (two) times daily. 11/15/22   Rosetta Posner, DPM  Cholecalciferol (VITAMIN D3) 50 MCG (2000 UT) TABS Take 2,000 Units by mouth daily.    [provider]  [facility] MUSCLE EX) Apply 1 application. topically daily as needed (Foot pain).    [provider]  mirabegron ER (MYRBETRIQ) 50 MG TB24 tablet Take 1 tablet (50 mg  total) by mouth daily. Patient not taking: Reported on 01/29/2022 04/03/21   Alfredo Martinez, MD  ondansetron (ZOFRAN) 4 MG tablet Take 1 tablet (4 mg total) by mouth every 6 (six) hours as needed for nausea. Patient not taking: Reported on 11/08/2022 02/16/22   Anson Oregon, PA-C    Physical Exam   Triage Vital Signs: ED Triage Vitals [07/08/23 0507]  Encounter Vitals Group     BP  (!) 167/110     Systolic BP Percentile      Diastolic BP Percentile      Pulse Rate 73     Resp 18     Temp 98.2 F (36.8 C)     Temp src      SpO2 99 %     Weight 145 lb (65.8 kg)     Height 5' (1.524 m)     Head Circumference      Peak Flow      Pain Score 5     Pain Loc      Pain Education      Exclude from Growth Chart     Most recent vital signs: Vitals:   07/08/23 0507  BP: (!) 167/110  Pulse: 73  Resp: 18  Temp: 98.2 F (36.8 C)  SpO2: 99%     CONSTITUTIONAL: Alert and responds appropriately to questions. Well-appearing; well-nourished HEAD: Normocephalic, atraumatic EYES: Medial left subconjunctival hemorrhage noted, no chemosis, no hyphema or hypopyon, pupils appear equal, funduscopic exam limited, extraocular movements intact, please see nursing notes for visual acuity, intraocular pressure of the left eye is 11 mmHg, no fluorescein uptake, no pain with consensual light response ENT: normal nose; moist mucous membranes NECK: Normal range of motion CARD: Regular rate and rhythm RESP: Normal chest excursion without splinting or tachypnea; no hypoxia or respiratory distress, speaking full sentences ABD/GI: non-distended EXT: Normal ROM in all joints, no major deformities noted SKIN: Normal color for age and race, no rashes on exposed skin NEURO: Moves all extremities equally, normal speech, no facial asymmetry noted, normal sensation diffusely PSYCH: The patient's mood and manner are appropriate. Grooming and personal hygiene are appropriate.  ED Results / Procedures / Treatments   LABS: (all labs ordered are listed, but only abnormal results are displayed) Labs Reviewed  CBC WITH DIFFERENTIAL/PLATELET  BASIC METABOLIC PANEL     EKG:    RADIOLOGY: My personal review and interpretation of imaging: Head CT unremarkable.  I have personally reviewed all radiology reports. CT Head Wo Contrast  Result Date: 07/08/2023 CLINICAL DATA:  72 year old  female with persistent headache. Left eye is red and itching. EXAM: CT HEAD WITHOUT CONTRAST TECHNIQUE: Contiguous axial images were obtained from the base of the skull through the vertex without intravenous contrast. RADIATION DOSE REDUCTION: This exam was performed according to the departmental dose-optimization program which includes automated exposure control, adjustment of the mA and/or kV according to patient size and/or use of iterative reconstruction technique. COMPARISON:  Head CT 05/15/2021. FINDINGS: Brain: Cerebral volume is within normal limits for age. No midline shift, ventriculomegaly, mass effect, evidence of mass lesion, intracranial hemorrhage or evidence of cortically based acute infarction. Chronic heterogeneity of the left thalamus is stable. And otherwise normal for age gray-white differentiation bilaterally. Vascular: No suspicious intracranial vascular hyperdensity. Calcified atherosclerosis at the skull base. Skull: No acute osseous abnormality identified. Sinuses/Orbits: Mild bilateral mastoid effusions have not significantly changed since 2022. Tympanic cavities remain clear. Visible paranasal sinuses are clear. Other: Visualized orbit soft tissues are  within normal limits. Visualized scalp soft tissues are within normal limits. IMPRESSION: No acute intracranial abnormality. 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 Electronically Signed   By: Odessa Fleming M.D.   On: 07/08/2023 06:08     PROCEDURES:  Critical Care performed: No   CRITICAL CARE Performed by: Baxter Hire Kaiana Marion   Total critical care time: 0 minutes  Critical care time was exclusive of separately billable procedures and treating other patients.  Critical care was necessary to treat or prevent imminent or life-threatening deterioration.  Critical care was time spent personally by me on the following activities: development of treatment plan with patient and/or surrogate as well as nursing, discussions  with consultants, evaluation of patient's response to treatment, examination of patient, obtaining history from patient or surrogate, ordering and performing treatments and interventions, ordering and review of laboratory studies, ordering and review of radiographic studies, pulse oximetry and re-evaluation of patient's condition.   Procedures    IMPRESSION / MDM / ASSESSMENT AND PLAN / ED COURSE  I reviewed the triage vital signs and the nursing notes.   Patient here with headaches, left subconjunctival hemorrhage.     DIFFERENTIAL DIAGNOSIS (includes but not limited to):   Low suspicion for intracranial hemorrhage, CVT, stroke, meningitis.  Headaches may be related to hypertension.  This may also be the cause of her subconjunctival hemorrhage.  No signs of glaucoma, endophthalmitis, globe injury, iritis, corneal abrasion or ulceration, foreign body.  Patient's presentation is most consistent with acute presentation with potential threat to life or bodily function.  PLAN: Will obtain labs, head CT.  Will give Tylenol for headache given she reports this has helped her at home.   MEDICATIONS GIVEN IN ED: Medications  fluorescein ophthalmic strip 1 strip (has no administration in time range)  tetracaine (PONTOCAINE) 0.5 % ophthalmic solution 2 drop (has no administration in time range)  acetaminophen (TYLENOL) tablet 1,000 mg (1,000 mg Oral Given by Other 07/08/23 0547)     ED COURSE: CT head reviewed and interpreted by myself and the radiologist and is unremarkable.  Labs pending.  Will recheck blood pressure.  Patient be signed out to oncoming ED physician at 7 AM.   CONSULTS: Pending further workup.   OUTSIDE RECORDS REVIEWED: Reviewed last PCP note on 05/14/2023.  Blood pressure at that time was 132/94.  They wanted to recheck again at a 6-week follow-up visit.  Prior to that in July 2024 at her PCP visit her blood pressure was 155/94.  Note in January 2024 states patient  did see her PCP for frontal headaches.  Blood pressure at that time was 143/88.  FINAL CLINICAL IMPRESSION(S) / ED DIAGNOSES   Final diagnoses:  Chronic nonintractable headache, unspecified headache type  Non-traumatic subconjunctival hemorrhage of left eye  Hypertension, unspecified type     Rx / DC Orders   ED Discharge Orders     None        Note:  This document was prepared using Dragon voice recognition software and may include unintentional dictation errors.   Vedh Ptacek, Layla Maw, DO 07/08/23 [phone]

## 2023-07-08 NOTE — Discharge Instructions (Addendum)
Your blood pressure was elevated in the emergency department today.  I recommend you follow-up close with your PCP for this.  Your labs and head CT showed no acute abnormality.  I recommend that you follow-up with neurology as an outpatient for your recurrent headaches.  It is safe to take over-the-counter Tylenol 1000 mg 6 hours every 6 hours as needed.

## 2023-12-30 ENCOUNTER — Other Ambulatory Visit: Payer: Self-pay | Admitting: Internal Medicine

## 2023-12-30 DIAGNOSIS — Z1231 Encounter for screening mammogram for malignant neoplasm of breast: Secondary | ICD-10-CM

## 2024-01-27 ENCOUNTER — Ambulatory Visit
Admission: RE | Admit: 2024-01-27 | Discharge: 2024-01-27 | Disposition: A | Discharge status: Home / Self Care | Source: Ambulatory Visit | Attending: Internal Medicine | Admitting: Internal Medicine

## 2024-01-27 DIAGNOSIS — Z1231 Encounter for screening mammogram for malignant neoplasm of breast: Secondary | ICD-10-CM | POA: Diagnosis present

## 2024-02-29 ENCOUNTER — Encounter: Payer: Self-pay | Admitting: Emergency Medicine

## 2024-02-29 ENCOUNTER — Ambulatory Visit
Admission: EM | Admit: 2024-02-29 | Discharge: 2024-02-29 | Disposition: A | Discharge status: Home / Self Care | Attending: Physician Assistant | Admitting: Physician Assistant

## 2024-02-29 DIAGNOSIS — L259 Unspecified contact dermatitis, unspecified cause: Secondary | ICD-10-CM

## 2024-02-29 MED ORDER — TRIAMCINOLONE ACETONIDE 0.1 % EX CREA: 1.0000 | TOPICAL_CREAM | Freq: Two times a day (BID) | CUTANEOUS | 0 refills | Status: AC

## 2024-02-29 NOTE — Discharge Instructions (Signed)
-   Your rash is consistent with contact dermatitis. - Use the topical corticosteroid cream twice daily on your neck.  You can also take Benadryl  or use topical Benadryl . - May use cool compresses. - See if you can identify anything your neck could have come into contact with that is different than usual such as necklaces, detergents, hair products, lotions and avoid it. - If rash is not improving over the next week please follow-up with your primary care provider.

## 2024-02-29 NOTE — ED Provider Notes (Signed)
 [facility]    CSN: [id number] Arrival date & time: 02/29/24  0813      History   Chief Complaint Chief Complaint  Patient presents with   Rash    HPI Jennifer Rubio is a 73 y.o. female presenting for approximately 4-day history of pruritic papular and vesicular rash of posterior neck.  Patient was seen at [facility] clinic 3 days ago and thought to have shingles since it was only on the left side of her neck.  She was placed on Valtrex but says it has not helped and now the rash spread to the right side of her neck.  She denies ever having any associated pain with the rash.  She says she occasionally wears a necklace but has not worn it recently.  No new hair products, lotions, detergents or body washes.  Patient cannot identify exacerbating factor.  She has not been treated with anything other than the Valtrex.  No other areas of rash no other complaints.  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   Muscle strain of left lower extremity 06/29/2015   Anemia 10/21/2011   Lower urinary tract infectious disease 10/21/2011   Cellulitis of knee, left 10/19/2011   HTN (hypertension), benign 10/19/2011   Obesity 10/19/2011   Osteoarthritis 10/19/2011   Hyperglycemia 10/19/2011    Past Surgical History:  Procedure Laterality Date   ABDOMINAL HYSTERECTOMY     ARTHRODESIS METATARSAL Right 11/15/2022   Procedure: ARTHRODESIS METATARSAL;  Surgeon: Pink Bridges, DPM;  Location: [facility];  Service: Podiatry;  Laterality: Right;   BLADDER SUSPENSION  11/26/2011   Procedure: TRANSVAGINAL TAPE (TVT) PROCEDURE;  Surgeon: Percy Bracken, MD;   Location: [facility];  Service: Gynecology;  Laterality: N/A;   BREAST CYST ASPIRATION Right    BUNIONECTOMY     CYSTOSCOPY  11/26/2011   Procedure: CYSTOSCOPY;  Surgeon: Percy Bracken, MD;  Location: [facility];  Service: Gynecology;  Laterality: N/A;   FOOT ARTHRODESIS Right 11/15/2022   Procedure: ARTHRODESIS FOOT;  Surgeon: Pink Bridges, DPM;  Location: [facility];  Service: Podiatry;  Laterality: Right;   GASTROC RECESSION EXTREMITY Right 11/15/2022   Procedure: GASTROC RECESSION EXTREMITY;  Surgeon: Pink Bridges, DPM;  Location: [facility];  Service: Podiatry;  Laterality: Right;   KNEE ARTHROSCOPY     both knees   RECTOCELE REPAIR  11/26/2011   Procedure: POSTERIOR REPAIR (RECTOCELE);  Surgeon: Marie-Lyne Lavoie, MD;  Location: [facility];  Service: Gynecology;  Laterality: N/A;   REVERSE SHOULDER ARTHROPLASTY Right 02/15/2022   Procedure: REVERSE SHOULDER ARTHROPLASTY WITH POSSIBLE BICEPS TENODESIS;  Surgeon: Elner Hahn, MD;  Location: [facility];  Service: Orthopedics;  Laterality: Right;   ROBOTIC ASSISTED LAPAROSCOPIC SACROCOLPOPEXY  11/26/2011   Procedure: ROBOTIC ASSISTED LAPAROSCOPIC SACROCOLPOPEXY;  Surgeon: Marie-Lyne Lavoie, MD;  Location: [facility];  Service: Gynecology;  Laterality: N/A;    OB History     Gravida  2   Para  2   Term  2   Preterm      AB      Living  2      SAB  IAB      Ectopic      Multiple      Live Births               Home Medications    Prior to Admission medications   Medication Sig Start Date End Date Taking? Authorizing Provider  Cholecalciferol  (VITAMIN D3) 50 MCG (2000 UT) TABS Take 2,000 Units by mouth daily.   Yes [provider]  triamcinolone  cream (KENALOG) 0.1 % Apply 1 Application topically 2 (two) times daily. 02/29/24  Yes Floydene Hy, PA-C  acetaminophen  (TYLENOL ) 500 MG tablet Take 1-2 tablets (500-1,000 mg total) by mouth every 6 (six) hours as needed for mild pain. 02/16/22   Rojelio Clement, PA-C  amoxicillin -clavulanate (AUGMENTIN ) 875-125 MG tablet Take 1 tablet by mouth 2 (two) times daily. 11/15/22   Pink Bridges, DPM  Lin[facility] MUSCLE EX) Apply 1 application. topically daily as needed (Foot pain).    [provider]  mirabegron  ER (MYRBETRIQ ) 50 MG TB24 tablet Take 1 tablet (50 mg total) by mouth daily. Patient not taking: Reported on 01/29/2022 04/03/21   MacDiarmid, Scott, MD  ondansetron  (ZOFRAN ) 4 MG tablet Take 1 tablet (4 mg total) by mouth every 6 (six) hours as needed for nausea. Patient not taking: Reported on 11/08/2022 02/16/22   Rojelio Clement, PA-C    Family History Family History  Problem Relation Age of Onset   Hypertension Father    Diabetes Sister    Cancer Sister        breast   Breast cancer Sister 7   Diabetes Brother    Cancer Sister        colon   Breast cancer Other     Social History Social History   Tobacco Use   Smoking status: Never   Smokeless tobacco: Never  Vaping Use   Vaping status: Never Used  Substance Use Topics   Alcohol use: No   Drug use: No     Allergies   Sulfa antibiotics   Review of Systems Review of Systems  Constitutional:  Negative for fatigue and fever.  Musculoskeletal:  Negative for joint swelling, neck pain and neck stiffness.  Skin:  Positive for rash. Negative for wound.  Neurological:  Negative for weakness and numbness.     Physical Exam Triage Vital Signs ED Triage Vitals  Encounter Vitals Group     BP 02/29/24 0825 (!) 150/94     Systolic BP Percentile --      Diastolic BP Percentile --      Pulse Rate 02/29/24 0825 67     Resp 02/29/24 0825 16     Temp 02/29/24 0825 97.9 F (36.6 C)     Temp Source 02/29/24 0825 Oral     SpO2 02/29/24 0825 100 %     Weight 02/29/24 0824 145 lb 1 oz (65.8 kg)     Height 02/29/24 0824 5' (1.524 m)     Head Circumference --      Peak Flow --      Pain Score 02/29/24 0822 8     Pain Loc --      Pain Education --       Exclude from Growth Chart --    No data found.  Updated Vital Signs BP (!) 150/94 (BP Location: Right Arm)   Pulse 67   Temp 97.9 F (36.6 C) (Oral)   Resp 16   Ht 5' (1.524 m)   Wt 145  lb 1 oz (65.8 kg)   LMP  (LMP Unknown)   SpO2 100%   BMI 28.33 kg/m      Physical Exam Vitals and nursing note reviewed.  Constitutional:      General: She is not in acute distress.    Appearance: Normal appearance. She is not ill-appearing or toxic-appearing.  HENT:     Head: Normocephalic and atraumatic.  Eyes:     General: No scleral icterus.       Right eye: No discharge.        Left eye: No discharge.     Conjunctiva/sclera: Conjunctivae normal.  Cardiovascular:     Rate and Rhythm: Normal rate.  Pulmonary:     Effort: Pulmonary effort is normal. No respiratory distress.  Musculoskeletal:     Cervical back: Neck supple.  Skin:    General: Skin is dry.     Findings: Rash (See image included in chart. There are numerous scatterd small papules and vesicles on the left and right posterior neck (L>R)) present.  Neurological:     General: No focal deficit present.     Mental Status: She is alert. Mental status is at baseline.     Motor: No weakness.     Gait: Gait normal.  Psychiatric:        Mood and Affect: Mood normal.        Behavior: Behavior normal.      UC Treatments / Results  Labs (all labs ordered are listed, but only abnormal results are displayed) Labs Reviewed - No data to display  EKG   Radiology No results found.  Procedures Procedures (including critical care time)  Medications Ordered in UC Medications - No data to display  Initial Impression / Assessment and Plan / UC Course  I have reviewed the triage vital signs and the nursing notes.  Pertinent labs & imaging results that were available during my care of the patient were reviewed by me and considered in my medical decision making (see chart for details).   73 year old female presents  for 4-day history of pruritic papular vesicular rash to posterior neck.  Seen 3 days ago at [facility] and placed on Valtrex.  No improvement.  Rash is now on the right side of her neck.  See image included in chart.  Most consistent with contact dermatitis although she cannot identify trigger.  Start triamcinolone  cream.  Also advised topical Benadryl , oral Benadryl , cool compresses and close monitoring.  If no improvement in a week advised to follow with PCP.  Reviewed returning for spreading or worsening of rash.  Patient is agreeable to plan.   Final Clinical Impressions(s) / UC Diagnoses   Final diagnoses:  Contact dermatitis, unspecified contact dermatitis type, unspecified trigger     Discharge Instructions      - Your rash is consistent with contact dermatitis. - Use the topical corticosteroid cream twice daily on your neck.  You can also take Benadryl  or use topical Benadryl . - May use cool compresses. - See if you can identify anything your neck could have come into contact with that is different than usual such as necklaces, detergents, hair products, lotions and avoid it. - If rash is not improving over the next week please follow-up with your primary care provider.   ED Prescriptions     Medication Sig Dispense Auth. Provider   triamcinolone  cream (KENALOG) 0.1 % Apply 1 Application topically 2 (two) times daily. 30 g Floydene Hy, PA-C  PDMP not reviewed this encounter.   Floydene Hy, PA-C 02/29/24 [phone]

## 2024-02-29 NOTE — ED Triage Notes (Signed)
 Pt c/o rash on her neck at her hairline. Started about a 4 days ago. She was seen at [facility] clinic walk in and told it was shingles, she was treated with Valtrex. She states the rash spread across the mid line to the other side of her neck. (Started on left and moved to right side). She states it is itchy.

## 2024-03-12 LAB — AMB RESULTS CONSOLE CBG: Glucose: 126

## 2024-03-12 NOTE — Progress Notes (Signed)
 No concerns addressed today.

## 2024-05-12 ENCOUNTER — Ambulatory Visit (LOCAL_COMMUNITY_HEALTH_CENTER)

## 2024-05-12 DIAGNOSIS — Z111 Encounter for screening for respiratory tuberculosis: Secondary | ICD-10-CM

## 2024-05-12 NOTE — Progress Notes (Signed)
 Tuberculin skin test applied to left ventral forearm.  Explained not to use lotions or creams, place bandages or wraps on testing area.  Advised, if bleeding occurs, to lightly dab testing area and given 2x2 gauze in case of leakage or bleeding.  Kandi KATHEE Glatter, RN

## 2024-05-14 ENCOUNTER — Ambulatory Visit

## 2024-05-14 DIAGNOSIS — Z111 Encounter for screening for respiratory tuberculosis: Secondary | ICD-10-CM

## 2024-05-14 LAB — TB SKIN TEST
Induration: 0 mm
TB Skin Test: NEGATIVE

## 2024-08-28 ENCOUNTER — Other Ambulatory Visit: Payer: Self-pay | Admitting: Internal Medicine

## 2024-08-28 DIAGNOSIS — I878 Other specified disorders of veins: Secondary | ICD-10-CM

## 2024-08-28 DIAGNOSIS — R0600 Dyspnea, unspecified: Secondary | ICD-10-CM

## 2024-08-28 DIAGNOSIS — I10 Essential (primary) hypertension: Secondary | ICD-10-CM

## 2024-09-03 ENCOUNTER — Ambulatory Visit: Admission: RE | Admit: 2024-09-03 | Discharge: 2024-09-03 | Attending: Internal Medicine | Admitting: Internal Medicine

## 2024-09-03 DIAGNOSIS — I10 Essential (primary) hypertension: Secondary | ICD-10-CM | POA: Diagnosis present

## 2024-09-03 DIAGNOSIS — I878 Other specified disorders of veins: Secondary | ICD-10-CM | POA: Diagnosis present

## 2024-09-03 DIAGNOSIS — R0600 Dyspnea, unspecified: Secondary | ICD-10-CM | POA: Insufficient documentation

## 2024-09-03 MED ORDER — IOHEXOL 350 MG/ML SOLN
75.0000 mL | Freq: Once | INTRAVENOUS | Status: AC | PRN
  Administered 2024-09-03: 75 mL via INTRAVENOUS

## 2024-09-30 ENCOUNTER — Other Ambulatory Visit: Payer: Self-pay | Admitting: Cardiology

## 2024-09-30 DIAGNOSIS — Z8249 Family history of ischemic heart disease and other diseases of the circulatory system: Secondary | ICD-10-CM

## 2024-09-30 DIAGNOSIS — I429 Cardiomyopathy, unspecified: Secondary | ICD-10-CM

## 2024-09-30 DIAGNOSIS — R0609 Other forms of dyspnea: Secondary | ICD-10-CM

## 2024-09-30 DIAGNOSIS — I5020 Unspecified systolic (congestive) heart failure: Secondary | ICD-10-CM

## 2024-09-30 DIAGNOSIS — I10 Essential (primary) hypertension: Secondary | ICD-10-CM

## 2024-10-06 ENCOUNTER — Encounter (HOSPITAL_COMMUNITY): Payer: Self-pay

## 2024-10-07 ENCOUNTER — Telehealth (HOSPITAL_COMMUNITY): Payer: Self-pay | Admitting: *Deleted

## 2024-10-07 NOTE — Telephone Encounter (Signed)
 Reaching out to patient to offer assistance regarding upcoming cardiac imaging study; pt verbalizes understanding of appt date/time, parking situation and where to check in, pre-test NPO status and medications ordered, and verified current allergies; name and call back number provided for further questions should they arise Sid Seats RN Navigator Cardiac Imaging [facility] [phone] office [phone] cell

## 2024-10-08 ENCOUNTER — Ambulatory Visit
Admission: RE | Admit: 2024-10-08 | Discharge: 2024-10-08 | Disposition: A | Discharge status: Home / Self Care | Source: Ambulatory Visit | Attending: Cardiology | Admitting: Cardiology

## 2024-10-08 DIAGNOSIS — R0609 Other forms of dyspnea: Secondary | ICD-10-CM | POA: Diagnosis present

## 2024-10-08 DIAGNOSIS — I429 Cardiomyopathy, unspecified: Secondary | ICD-10-CM | POA: Diagnosis present

## 2024-10-08 DIAGNOSIS — Z8249 Family history of ischemic heart disease and other diseases of the circulatory system: Secondary | ICD-10-CM | POA: Diagnosis present

## 2024-10-08 DIAGNOSIS — I5020 Unspecified systolic (congestive) heart failure: Secondary | ICD-10-CM | POA: Diagnosis present

## 2024-10-08 DIAGNOSIS — I10 Essential (primary) hypertension: Secondary | ICD-10-CM | POA: Insufficient documentation

## 2024-10-08 MED ORDER — NITROGLYCERIN 0.4 MG SL SUBL
0.8000 mg | SUBLINGUAL_TABLET | Freq: Once | SUBLINGUAL | Status: AC
  Administered 2024-10-08: 0.8 mg via SUBLINGUAL
  Filled 2024-10-08: qty 25

## 2024-10-08 MED ORDER — IOHEXOL 350 MG/ML SOLN
100.0000 mL | Freq: Once | INTRAVENOUS | Status: AC | PRN
  Administered 2024-10-08: 100 mL via INTRAVENOUS

## 2024-10-08 NOTE — Progress Notes (Signed)
 Patient tolerated procedure well. W/C to lobby.  Ambulate w/o difficulty. Denies light headedness or being dizzy. Encouraged to drink extra water today and reasoning explained. Verbalized understanding. All questions answered. ABC intact. No further needs. Discharge from procedure area w/o issues.

## 2024-10-10 ENCOUNTER — Emergency Department

## 2024-10-10 ENCOUNTER — Other Ambulatory Visit: Payer: Self-pay

## 2024-10-10 ENCOUNTER — Emergency Department
Admission: EM | Admit: 2024-10-10 | Discharge: 2024-10-10 | Disposition: A | Discharge status: Home / Self Care | Attending: Emergency Medicine | Admitting: Emergency Medicine

## 2024-10-10 DIAGNOSIS — E669 Obesity, unspecified: Secondary | ICD-10-CM | POA: Diagnosis not present

## 2024-10-10 DIAGNOSIS — K219 Gastro-esophageal reflux disease without esophagitis: Secondary | ICD-10-CM | POA: Diagnosis not present

## 2024-10-10 DIAGNOSIS — S0990XA Unspecified injury of head, initial encounter: Secondary | ICD-10-CM | POA: Diagnosis present

## 2024-10-10 DIAGNOSIS — Z6827 Body mass index (BMI) 27.0-27.9, adult: Secondary | ICD-10-CM | POA: Diagnosis not present

## 2024-10-10 DIAGNOSIS — R0789 Other chest pain: Secondary | ICD-10-CM | POA: Insufficient documentation

## 2024-10-10 DIAGNOSIS — M546 Pain in thoracic spine: Secondary | ICD-10-CM | POA: Insufficient documentation

## 2024-10-10 DIAGNOSIS — I700 Atherosclerosis of aorta: Secondary | ICD-10-CM | POA: Diagnosis not present

## 2024-10-10 DIAGNOSIS — E785 Hyperlipidemia, unspecified: Secondary | ICD-10-CM | POA: Insufficient documentation

## 2024-10-10 DIAGNOSIS — W010XXA Fall on same level from slipping, tripping and stumbling without subsequent striking against object, initial encounter: Secondary | ICD-10-CM | POA: Diagnosis not present

## 2024-10-10 DIAGNOSIS — I10 Essential (primary) hypertension: Secondary | ICD-10-CM | POA: Insufficient documentation

## 2024-10-10 DIAGNOSIS — M25562 Pain in left knee: Secondary | ICD-10-CM | POA: Diagnosis present

## 2024-10-10 DIAGNOSIS — M79601 Pain in right arm: Secondary | ICD-10-CM | POA: Diagnosis not present

## 2024-10-10 DIAGNOSIS — I429 Cardiomyopathy, unspecified: Secondary | ICD-10-CM | POA: Diagnosis not present

## 2024-10-10 DIAGNOSIS — W19XXXA Unspecified fall, initial encounter: Secondary | ICD-10-CM

## 2024-10-10 DIAGNOSIS — R079 Chest pain, unspecified: Secondary | ICD-10-CM

## 2024-10-10 LAB — BASIC METABOLIC PANEL WITH GFR
Anion gap: 10 (ref 5–15)
BUN: 14 mg/dL (ref 8–23)
CO2: 24 mmol/L (ref 22–32)
Calcium: 9.2 mg/dL (ref 8.9–10.3)
Chloride: 107 mmol/L (ref 98–111)
Creatinine, Ser: 0.94 mg/dL (ref 0.44–1.00)
GFR, Estimated: >60 mL/min
Glucose, Bld: 122 mg/dL — ABNORMAL HIGH (ref 70–99)
Potassium: 4.1 mmol/L (ref 3.5–5.1)
Sodium: 141 mmol/L (ref 135–145)

## 2024-10-10 LAB — CBC
HCT: 37.7 % (ref 36.0–46.0)
Hemoglobin: 12 g/dL (ref 12.0–15.0)
MCH: 27.5 pg (ref 26.0–34.0)
MCHC: 31.8 g/dL (ref 30.0–36.0)
MCV: 86.3 fL (ref 80.0–100.0)
Platelets: 229 K/uL (ref 150–400)
RBC: 4.37 MIL/uL (ref 3.87–5.11)
RDW: 14 % (ref 11.5–15.5)
WBC: 5.3 K/uL (ref 4.0–10.5)
nRBC: 0 % (ref 0.0–0.2)

## 2024-10-10 LAB — TROPONIN T, HIGH SENSITIVITY
Troponin T High Sensitivity: <15 ng/L (ref 0–19)
Troponin T High Sensitivity: <15 ng/L (ref 0–19)

## 2024-10-10 MED ORDER — ACETAMINOPHEN 500 MG PO TABS
1000.0000 mg | ORAL_TABLET | Freq: Once | ORAL | Status: AC
  Administered 2024-10-10: 1000 mg via ORAL
  Filled 2024-10-10: qty 2

## 2024-10-10 NOTE — ED Provider Notes (Signed)
 "  [facility] Provider Note    Event Date/Time   First MD Initiated Contact with Patient 10/10/24 CLEOTIS     (approximate)   History   Fall and Chest Pain   HPI  Jennifer Rubio is a 74 y.o. female past medical history significant for cardiomyopathy, anemia, GERD, hypertension, hyperlipidemia, obesity, who presents to the emergency department with chest pain.  Patient states that she tripped on her grandchild's toy yesterday and had a fall twisting her left knee.  States that she was able to catch herself.  Started having some chest discomfort last night and left knee pain.  Ongoing pain today which brought her into the emergency department.  Chest pain has been ongoing since last night.  Denies any significant shortness of breath.  Denies head injury or loss of consciousness.  Does endorse some mild back pain and right arm pain.  Denies any numbness or weakness.  No cough or congestion.  No fever or chills.  Recent increase of her dose of metoprolol.  No other anticoagulation or change of medications.  No dysuria.  On chart review last echocardiogram 08/2024 showed mildly reduced EF of 45% with global hypokinesis and mild LVH with no significant valvular abnormalities.     Physical Exam   Triage Vital Signs: ED Triage Vitals  Encounter Vitals Group     BP 10/10/24 1830 (!) 140/86     Girls Systolic BP Percentile --      Girls Diastolic BP Percentile --      Boys Systolic BP Percentile --      Boys Diastolic BP Percentile --      Pulse Rate 10/10/24 1830 77     Resp 10/10/24 1830 16     Temp 10/10/24 1830 98.3 F (36.8 C)     Temp Source 10/10/24 1830 Oral     SpO2 10/10/24 1830 98 %     Weight 10/10/24 1828 153 lb (69.4 kg)     Height 10/10/24 1828 5' 3 (1.6 m)     Head Circumference --      Peak Flow --      Pain Score 10/10/24 1828 7     Pain Loc --      Pain Education --      Exclude from Growth Chart --     Most recent vital  signs: Vitals:   10/10/24 2030 10/10/24 2100  BP: 134/82 119/75  Pulse: 69 76  Resp: (!) 21 (!) 21  Temp:    SpO2: 97% 96%    Physical Exam Constitutional:      Appearance: She is well-developed.  HENT:     Head: Atraumatic.  Eyes:     Conjunctiva/sclera: Conjunctivae normal.  Cardiovascular:     Rate and Rhythm: Regular rhythm.     Pulses:          Radial pulses are 2+ on the right side and 2+ on the left side.       Dorsalis pedis pulses are 2+ on the right side and 2+ on the left side.     Heart sounds: Normal heart sounds.  Pulmonary:     Effort: No respiratory distress.     Comments: Mild chest wall tenderness palpation Abdominal:     General: There is no distension.     Palpations: Abdomen is soft.     Tenderness: There is no abdominal tenderness.  Musculoskeletal:        General: Normal range of motion.  Cervical back: Normal range of motion.     Comments: Tenderness to palpation to the left knee.  Tenderness to palpation to the mid thoracic back.  No tenderness to palpation to the cervical spine or lumbar spine.  No tenderness to palpation to bilateral hips.  No tenderness to the right lower extremity.  No tenderness to left upper extremity.  Skin:    General: Skin is warm.  Neurological:     Mental Status: She is alert. Mental status is at baseline.     IMPRESSION / MDM / ASSESSMENT AND PLAN / ED COURSE  I reviewed the triage vital signs and the nursing notes.  Differential diagnosis including rib fracture, lung contusion, musculoskeletal strain, knee fracture, meniscus injury, ligamentous injury, ACS, dysrhythmia   EKG  I, Clotilda Punter, the attending physician, personally viewed and interpreted this ECG.  EKG showed normal sinus rhythm.  T wave inversion of V3 with some T wave flattening of V4.  No significant ST elevation.  Normal intervals.  No chamber enlargement.  When compared to prior EKG T wave inversion to V3 and flattening in V4 is new when  compared to prior.   No tachycardic or bradycardic dysrhythmias while on cardiac telemetry.  RADIOLOGY Chest x-ray with no acute findings -no acute findings  CT scan of the head -no signs of intracranial hemorrhage  CT head and cervical spine -no sign of cervical spine fracture  CT chest without contrast -no acute findings aortic atherosclerosis  CT thoracic spine -no acute fracture  X-ray right upper extremity -no fracture  X-ray left knee -no acute fracture, moderate degenerative changes   LABS (all labs ordered are listed, but only abnormal results are displayed) Labs interpreted as -    Labs Reviewed  BASIC METABOLIC PANEL WITH GFR - Abnormal; Notable for the following components:      Result Value   Glucose, Bld 122 (*)    All other components within normal limits  CBC  TROPONIN T, HIGH SENSITIVITY  TROPONIN T, HIGH SENSITIVITY     MDM  No significant leukocytosis or anemia.  Creatinine is 0.94 from a baseline of 0.7.  Normal BUN.  No significant electrolyte abnormality.  Initial troponin is undetectable chest x-ray with no signs of pneumonia, pneumothorax or widened mediastinum.  Low suspicion for ACS, troponin undetectable and reproducible chest tenderness after a fall.  Given Tylenol  for pain control.  On reevaluation states she is feeling better.  Remains chest pain-free.  Serial troponins are undetectable.  Did have some EKG changes from prior moderate risk heart score.  Chest pain is most likely musculoskeletal given her fall.  After discussion with patient and family members at bedside will follow-up closely as an outpatient with cardiology and return to the emergency department for any ongoing or worsening symptoms.  Discussed follow-up with orthopedics for ongoing knee pain for possible meniscus injury or ligamentous injury.  Discussed Tylenol  for pain control.  No questions or concerns at time of discharge.     PROCEDURES:  Critical Care performed:  No  Procedures  Patient's presentation is most consistent with acute presentation with potential threat to life or bodily function.   MEDICATIONS ORDERED IN ED: Medications  acetaminophen  (TYLENOL ) tablet 1,000 mg (1,000 mg Oral Given 10/10/24 2014)    FINAL CLINICAL IMPRESSION(S) / ED DIAGNOSES   Final diagnoses:  Fall, initial encounter  Acute pain of left knee  Chest pain, unspecified type     Rx / DC Orders   ED  Discharge Orders     None        Note:  This document was prepared using Dragon voice recognition software and may include unintentional dictation errors.   Suzanne Kirsch, MD 10/10/24 2228  "

## 2024-10-10 NOTE — ED Triage Notes (Signed)
 Pt had a mechanical fall yesterday, c/o central CP today. No LOC, no blood thinners. Pt AOX4, NAD noted. Pt denies other pain, dizziness, SHOB, or nausea.

## 2024-10-10 NOTE — Discharge Instructions (Signed)
 Call on Monday to follow-up closely with your cardiologist and return to the emergency department if you have any worsening chest pain or new concerning symptoms.  You are given information to follow-up orthopedics if you have ongoing left knee pain as you may need an outpatient MRI.  Follow-up closely with your primary care provider.  Return to the emergency department for any worsening symptoms.  You can apply ice, rest and elevate to help with the swelling.  You can take Tylenol  for pain control.  You can use over-the-counter Lidoderm  patches to any areas of pain and see if this improves your pain, keep on for 12 hours and then remove.  Pain control:  Acetaminophen  (tylenol ) - You can take 2 extra strength tablets (1000 mg) every 6 hours as needed for pain/fever.
